# Patient Record
Sex: Female | Born: 1985
Health system: Southern US, Community
[De-identification: ages and names within clinical notes are randomized; demographics above are authoritative.]

## PROBLEM LIST (undated history)

## (undated) ENCOUNTER — Inpatient Hospital Stay (HOSPITAL_COMMUNITY): Payer: Self-pay

## (undated) DIAGNOSIS — G43909 Migraine, unspecified, not intractable, without status migrainosus: Secondary | ICD-10-CM

## (undated) DIAGNOSIS — L309 Dermatitis, unspecified: Secondary | ICD-10-CM

## (undated) DIAGNOSIS — F909 Attention-deficit hyperactivity disorder, unspecified type: Secondary | ICD-10-CM

## (undated) DIAGNOSIS — F32A Depression, unspecified: Secondary | ICD-10-CM

## (undated) DIAGNOSIS — F319 Bipolar disorder, unspecified: Secondary | ICD-10-CM

## (undated) DIAGNOSIS — F329 Major depressive disorder, single episode, unspecified: Secondary | ICD-10-CM

## (undated) HISTORY — DX: Depression, unspecified: F32.A

## (undated) HISTORY — PX: WISDOM TOOTH EXTRACTION: SHX21

## (undated) HISTORY — DX: Dermatitis, unspecified: L30.9

## (undated) HISTORY — DX: Migraine, unspecified, not intractable, without status migrainosus: G43.909

## (undated) HISTORY — DX: Bipolar disorder, unspecified: F31.9

## (undated) HISTORY — DX: Attention-deficit hyperactivity disorder, unspecified type: F90.9

## (undated) HISTORY — DX: Major depressive disorder, single episode, unspecified: F32.9

---
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
## 2007-01-07 ENCOUNTER — Inpatient Hospital Stay (HOSPITAL_COMMUNITY): Admission: AD | Admit: 2007-01-07 | Discharge: 2007-01-08 | Payer: Self-pay | Admitting: Obstetrics & Gynecology

## 2007-01-11 ENCOUNTER — Inpatient Hospital Stay (HOSPITAL_COMMUNITY): Admission: AD | Admit: 2007-01-11 | Discharge: 2007-01-11 | Payer: Self-pay | Admitting: Obstetrics

## 2007-01-15 ENCOUNTER — Inpatient Hospital Stay (HOSPITAL_COMMUNITY): Admission: AD | Admit: 2007-01-15 | Discharge: 2007-01-16 | Payer: Self-pay | Admitting: Obstetrics

## 2007-01-20 ENCOUNTER — Inpatient Hospital Stay (HOSPITAL_COMMUNITY): Admission: AD | Admit: 2007-01-20 | Discharge: 2007-01-22 | Payer: Self-pay | Admitting: Obstetrics & Gynecology

## 2007-11-27 ENCOUNTER — Emergency Department (HOSPITAL_COMMUNITY): Admission: EM | Admit: 2007-11-27 | Discharge: 2007-11-27 | Payer: Self-pay | Admitting: Emergency Medicine

## 2007-11-29 ENCOUNTER — Emergency Department (HOSPITAL_COMMUNITY): Admission: EM | Admit: 2007-11-29 | Discharge: 2007-11-29 | Payer: Self-pay | Admitting: Emergency Medicine

## 2007-12-01 ENCOUNTER — Ambulatory Visit: Payer: Self-pay | Admitting: Family Medicine

## 2008-09-18 ENCOUNTER — Emergency Department (HOSPITAL_COMMUNITY): Admission: EM | Admit: 2008-09-18 | Discharge: 2008-09-18 | Payer: Self-pay | Admitting: Emergency Medicine

## 2008-09-25 ENCOUNTER — Emergency Department (HOSPITAL_COMMUNITY): Admission: EM | Admit: 2008-09-25 | Discharge: 2008-09-25 | Payer: Self-pay | Admitting: Emergency Medicine

## 2008-10-10 ENCOUNTER — Emergency Department (HOSPITAL_COMMUNITY): Admission: EM | Admit: 2008-10-10 | Discharge: 2008-10-10 | Payer: Self-pay | Admitting: Emergency Medicine

## 2009-05-11 ENCOUNTER — Emergency Department (HOSPITAL_COMMUNITY): Admission: EM | Admit: 2009-05-11 | Discharge: 2009-05-11 | Payer: Self-pay | Admitting: Emergency Medicine

## 2009-08-27 ENCOUNTER — Emergency Department (HOSPITAL_COMMUNITY): Admission: EM | Admit: 2009-08-27 | Discharge: 2009-08-27 | Payer: Self-pay | Admitting: Emergency Medicine

## 2010-10-13 ENCOUNTER — Emergency Department (HOSPITAL_COMMUNITY)
Admission: EM | Admit: 2010-10-13 | Discharge: 2010-10-14 | Payer: Self-pay | Source: Home / Self Care | Admitting: Emergency Medicine

## 2010-11-03 DIAGNOSIS — L309 Dermatitis, unspecified: Secondary | ICD-10-CM

## 2010-11-03 HISTORY — DX: Dermatitis, unspecified: L30.9

## 2011-01-14 LAB — URINALYSIS, ROUTINE W REFLEX MICROSCOPIC
Ketones, ur: 15 mg/dL — AB
Nitrite: NEGATIVE
Protein, ur: 30 mg/dL — AB
Urobilinogen, UA: 0.2 mg/dL (ref 0.0–1.0)
pH: 5.5 (ref 5.0–8.0)

## 2011-01-14 LAB — POCT PREGNANCY, URINE

## 2011-01-14 LAB — COMPREHENSIVE METABOLIC PANEL
Calcium: 9.3 mg/dL (ref 8.4–10.5)
GFR calc non Af Amer: >60 mL/min (ref >60)
Potassium: 3.8 mEq/L (ref 3.5–5.1)
Sodium: 140 mEq/L (ref 135–145)
Total Bilirubin: 1 mg/dL (ref 0.3–1.2)
Total Protein: 6.7 g/dL (ref 6.0–8.3)

## 2011-01-14 LAB — CBC
Hemoglobin: 12.8 g/dL (ref 12.0–15.0)
MCHC: 34.5 g/dL (ref 30.0–36.0)
Platelets: 198 10*3/uL (ref 150–400)
RDW: 13.5 % (ref 11.5–15.5)

## 2011-01-14 LAB — DIFFERENTIAL
Basophils Absolute: 0 10*3/uL (ref 0.0–0.1)
Basophils Relative: 0 % (ref 0–1)
Monocytes Absolute: 0.8 10*3/uL (ref 0.1–1.0)
Neutro Abs: 14.2 10*3/uL — ABNORMAL HIGH (ref 1.7–7.7)
Neutrophils Relative %: 85 % — ABNORMAL HIGH (ref 43–77)

## 2011-01-14 LAB — WET PREP, GENITAL
Trich, Wet Prep: NONE SEEN
Yeast Wet Prep HPF POC: NONE SEEN

## 2011-01-14 LAB — LIPASE, BLOOD: Lipase: 20 U/L (ref 11–59)

## 2011-01-14 LAB — GC/CHLAMYDIA PROBE AMP, GENITAL

## 2011-02-09 LAB — DIFFERENTIAL
Basophils Absolute: 0 10*3/uL (ref 0.0–0.1)
Basophils Relative: 0 % (ref 0–1)
Neutro Abs: 8.8 10*3/uL — ABNORMAL HIGH (ref 1.7–7.7)
Neutrophils Relative %: 79 % — ABNORMAL HIGH (ref 43–77)

## 2011-02-09 LAB — CBC
HCT: 41.2 % (ref 36.0–46.0)
Hemoglobin: 14.3 g/dL (ref 12.0–15.0)
MCV: 90.8 fL (ref 78.0–100.0)
RDW: 13.6 % (ref 11.5–15.5)

## 2011-02-09 LAB — URINALYSIS, ROUTINE W REFLEX MICROSCOPIC
Ketones, ur: 15 mg/dL — AB
Nitrite: POSITIVE — AB
Protein, ur: 100 mg/dL — AB

## 2011-02-09 LAB — COMPREHENSIVE METABOLIC PANEL
Albumin: 4 g/dL (ref 3.5–5.2)
BUN: 6 mg/dL (ref 6–23)
CO2: 25 mEq/L (ref 19–32)
Chloride: 104 mEq/L (ref 96–112)
Creatinine, Ser: 0.79 mg/dL (ref 0.4–1.2)
GFR calc non Af Amer: >60 mL/min (ref >60)
Total Bilirubin: 0.7 mg/dL (ref 0.3–1.2)

## 2011-02-09 LAB — POCT PREGNANCY, URINE

## 2011-03-21 NOTE — Discharge Summary (Signed)
Victoria Barnes, Victoria Barnes               ACCOUNT NO.:  [id number]   MEDICAL RECORD NO.:  [id number]          PATIENT TYPE:  INP   LOCATION:  9159                          FACILITY:  [facility]   PHYSICIAN:  Charles A. Clearance Coots, M.D.DATE OF BIRTH:  02/26/1986   DATE OF ADMISSION:  01/07/2007  DATE OF DISCHARGE:  01/08/2007                               DISCHARGE SUMMARY   ADMISSION DIAGNOSES:  1. [redacted] weeks gestation.  2. Vaginal bleeding after intercourse.   DISCHARGE DIAGNOSIS:  1. [redacted] weeks gestation.  2. Vaginal bleeding after intercourse.  3. Much improved after bedrest and observation. No further vaginal      bleeding after admission to the hospital.   Discharged home in good condition and [redacted] weeks gestation undelivered.   REASON FOR ADMISSION:  An 25 year old G1 with an estimated date of  confinement of February 12, 2007 presents complaining of vaginal bleeding  after intercourse. The patient had minimal cramping and denied any  vaginal discharge.   PAST MEDICAL HISTORY:  Surgery; oral surgery and foot surgery.  Illnesses; migraine headaches.   MEDICATIONS:  Prenatal vitamins.   ALLERGIES:  PENICILLIN and KEFLEX.   SOCIAL HISTORY:  Consulting civil engineer at [employer], single.  Negative for alcohol, tobacco  or recreational drug use.   FAMILY HISTORY:  Prostate cancer, hypertension, diabetes, asthma,  Alzheimer's disease and peptic ulcer disease.   PHYSICAL EXAM:  GENERAL:  Slim black female in no acute distress.  VITAL SIGNS:  Afebrile, vital signs were stable. Fetal heart rate in  140's. Occasional uterine contractions on toco.  LUNGS:  Clear to auscultation bilaterally.  HEART: Regular rate and rhythm.  ABDOMEN:  Gravid, nontender.  PELVIC:  Sterile vaginal examination; there was a moderate amount of  blood noted.  Cervix was long and closed.   IMPRESSION:  Third trimester bleeding related to intercourse, stable, [redacted]  weeks gestation.   PLAN:  Admit for observation.   HOSPITAL COURSE:  The  patient was admitted on bedrest and had no further  bleeding during her period of observation for 23 hours. She was  therefore discharged home on hospital day in stable condition with no  further bleeding at [redacted] weeks gestation to be followed up in the office.   LABORATORY VALUES:  Ultrasound revealed placenta to be posterior above  the cervical os, no evidence of previa, no retroplacental clot noted.  Growth and measurements were all within normal limits, presentation was  cephalic.   DISCHARGE DISPOSITION:  Medications; continue prenatal vitamins. No  sexual intercourse for the remainder of the pregnancy.  The patient is  to call office for a follow-up appointment in 2 weeks.      Charles A. Clearance Coots, M.D.  Electronically Signed     CAH/MEDQ  D:  01/08/2007  T:  01/08/2007  Job:  [id number]

## 2011-03-21 NOTE — H&P (Signed)
Victoria Barnes, Victoria Barnes NO.:  [id number]   MEDICAL RECORD NO.:  [id number]          PATIENT TYPE:  INP   LOCATION:  9159                          [facility]ACILITY:  [facility]   PHYSICIAN:  Roseanna Rainbow, M.D.DATE OF BIRTH:  [birth year]   DATE OF ADMISSION:  01/07/2007  DATE OF DISCHARGE:                              HISTORY & PHYSICAL   CHIEF COMPLAINT:  The patient is an 25 year old gravida 1 para 0 with an  estimated date of confinement of February 12, 2007 with an intrauterine  pregnancy at 34 weeks complaining of vaginal bleeding.   HISTORY OF PRESENT ILLNESS:  Please see the above.  The patient reports  recent coitus.  Subsequent to this she has noticed fairly heavy vaginal  bleeding that was not self-limited.  She has minimal cramping.  She  denies any prior vaginal discharge.   ALLERGIES:  PENICILLIN AND CEPHALOSPORINS.   MEDICATIONS:  Please see the medication reconciliation sheet.   OBSTETRICAL RISK FACTORS:  She is Rh negative, nonsensitized.  She has a  recent positive Chlamydia DNA probe.   PRENATAL LABORATORIES:  Chlamydia DNA probe positive.  Urine culture and  sensitivity no growth.  One-hour GCT 125.  GC probe negative.  Hepatitis  B surface antigen negative.  Hematocrit 33.5, hemoglobin 11.1.  Pap  smear low-grade SIL.  Platelets 200,000.  Blood type A negative,  antibody screen negative.  Rubella immune.  Sickle cell negative.   PAST GYNECOLOGIC HISTORY:  Regular menses.  Abnormal Pap smears.  Gonorrhea.  Urinary tract infections.   PAST MEDICAL HISTORY:  Migraine headaches.   PAST SURGICAL HISTORY:  Oral surgery, foot surgery.   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  She denies  any history of alcohol usage.  She has no significant smoking history.  She denies illicit drug use.   FAMILY HISTORY:  Prostate cancer, hypertension, adult-onset diabetes,  asthma, Alzheimer's disease, and peptic ulcer disease.   PHYSICAL  EXAMINATION:  VITAL SIGNS:  Stable, afebrile.  Fetal heart  baseline 140s, mild variable decelerations.  Tocodynamometer rare  uterine contractions.  ABDOMEN:  Nontender.  STERILE VAGINAL EXAM:  Per the RN.  There is moderate blood noted.  The  cervix is long and closed.   ASSESSMENT:  Third trimester bleeding likely related to recent  intercourse, rule out cervicitis.   PLAN:  Observe in the hospital on bed rest.  We will check a CBC.  Continuous fetal monitoring for now.      Roseanna Rainbow, M.D.  Electronically Signed     LAJ/MEDQ  D:  01/07/2007  T:  01/07/2007  Job:  [id number]

## 2011-07-25 LAB — CBC
HCT: 40
MCV: 88.6
Platelets: 193
RDW: 13.4

## 2011-07-25 LAB — URINALYSIS, ROUTINE W REFLEX MICROSCOPIC
Bilirubin Urine: NEGATIVE
Hgb urine dipstick: NEGATIVE
Nitrite: NEGATIVE
Specific Gravity, Urine: 1.019
pH: 6

## 2011-07-25 LAB — I-STAT 8, (EC8 V) (CONVERTED LAB)
Acid-base deficit: 2
Chloride: 108
HCT: 45
Operator id: 196461
TCO2: 25
pCO2, Ven: 44.1 — ABNORMAL LOW

## 2011-07-25 LAB — POCT I-STAT CREATININE
Creatinine, Ser: 0.9
Operator id: 196461

## 2011-07-25 LAB — POCT PREGNANCY, URINE
Operator id: 151321
Preg Test, Ur: NEGATIVE

## 2011-08-08 LAB — URINALYSIS, ROUTINE W REFLEX MICROSCOPIC
Bilirubin Urine: NEGATIVE
Glucose, UA: NEGATIVE mg/dL
Specific Gravity, Urine: 1.01 (ref 1.005–1.030)
pH: 7 (ref 5.0–8.0)

## 2011-08-08 LAB — URINE MICROSCOPIC-ADD ON

## 2011-11-06 IMAGING — CT CT ABD-PELV W/ CM
2 of 4 series · 17 of 46 positions shown, 19 images · IV contrast (APPLIED)
Comparison: 10/10/2008

CLINICAL DATA: Abdominal pain

CT ABDOMEN AND PELVIS WITH CONTRAST
TECHNIQUE: Multidetector CT imaging of the abdomen and pelvis was
performed following the standard protocol during bolus
administration of intravenous contrast.

[Series 2: abd/pelv with 5.0 b31f st · axial · 0.61mm/px · z∈[+823,+1168]mm · 14 of 76 slices shown, 16 images]
[im 4/76  soft-tissue]
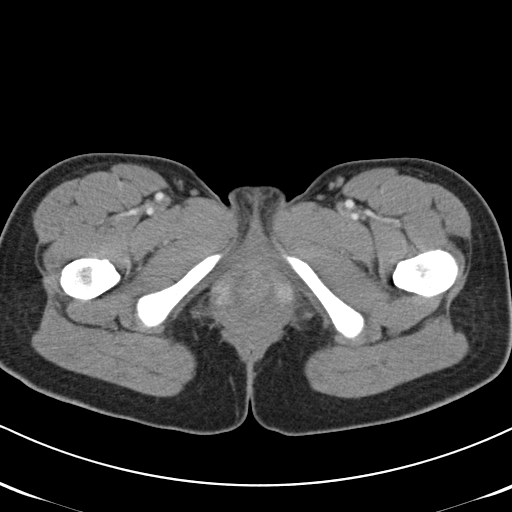
[im 4/76  bone]
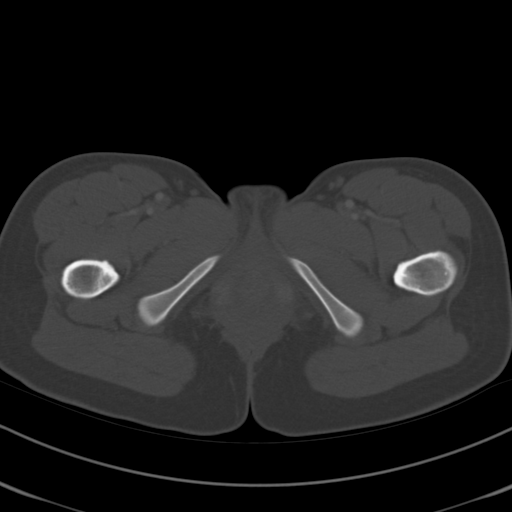
[im 10/76  soft-tissue]
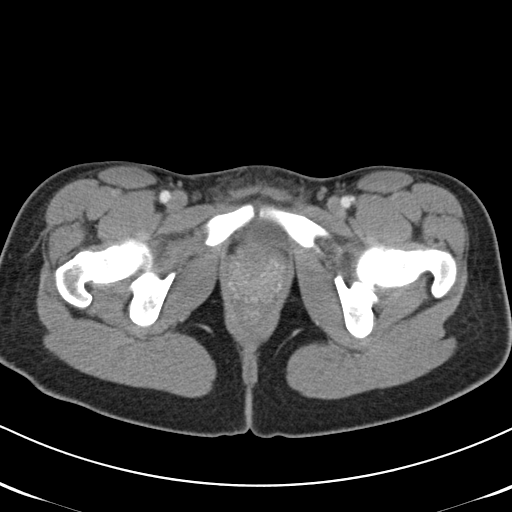
[im 16/76  soft-tissue]
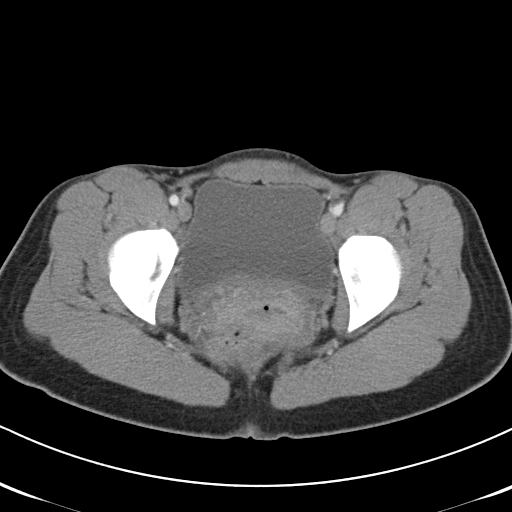
[im 22/76  soft-tissue]
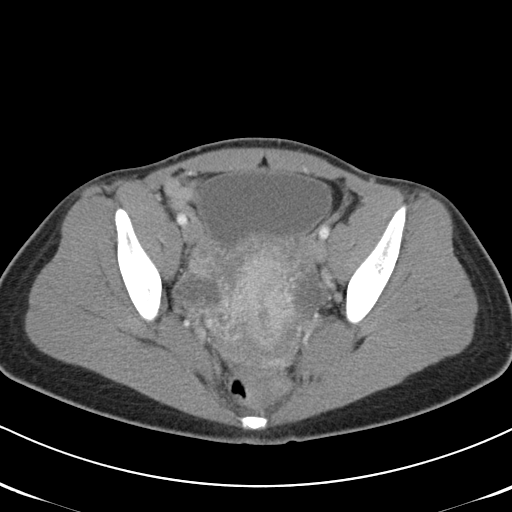
[im 25/76  soft-tissue]
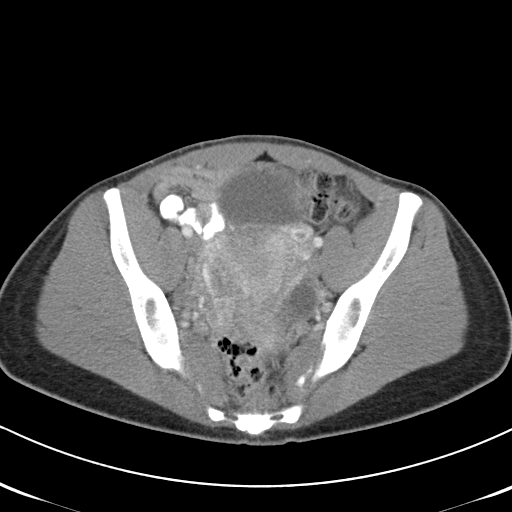
[im 31/76  soft-tissue]
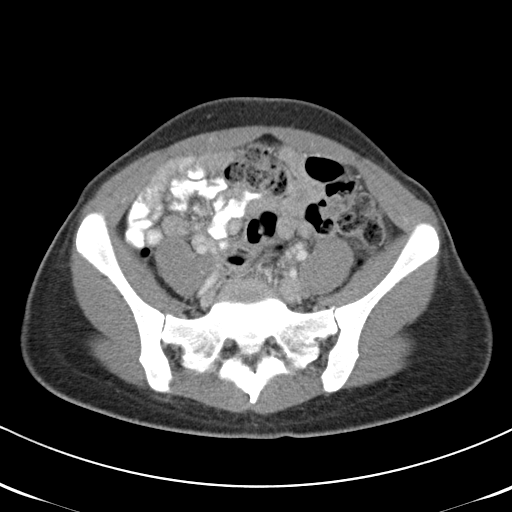
[im 37/76  soft-tissue]
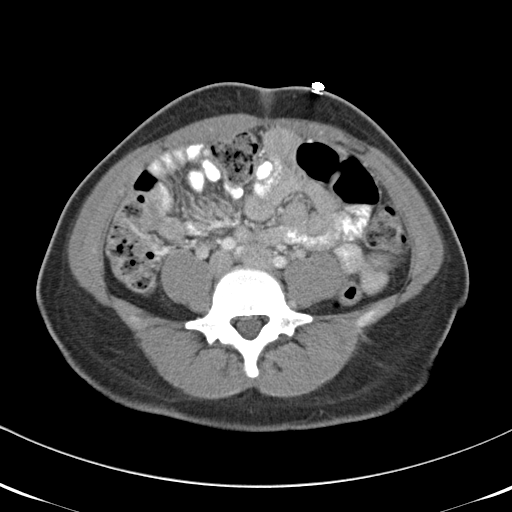
[im 40/76  soft-tissue]
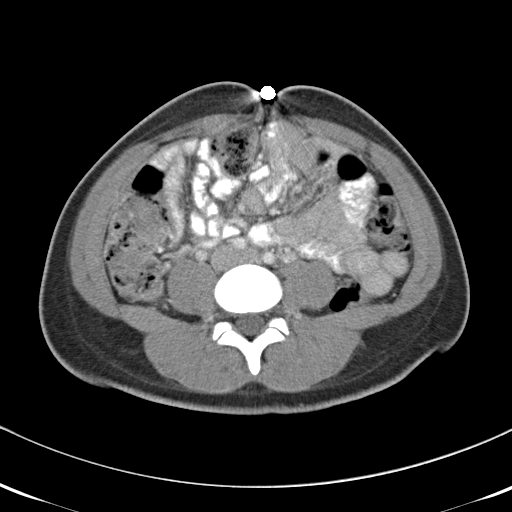
[im 46/76  soft-tissue]
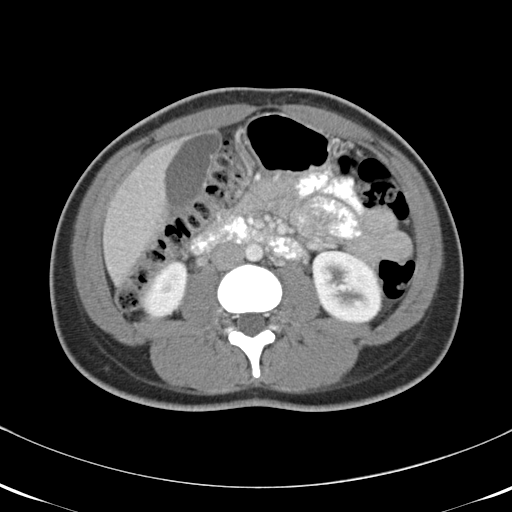
[im 46/76  bone]
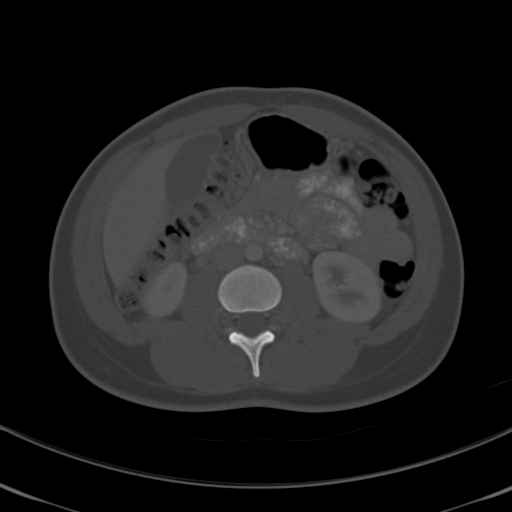
[im 52/76  soft-tissue]
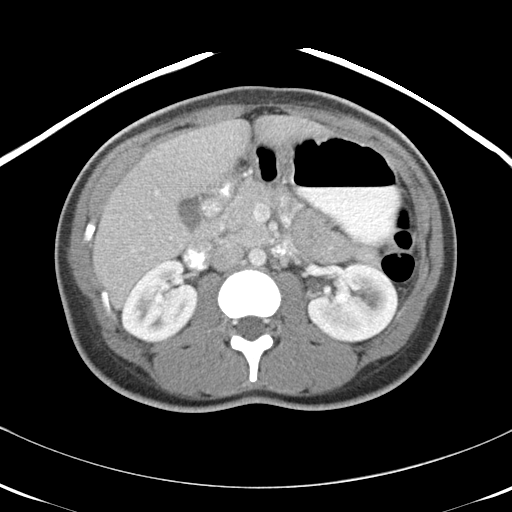
[im 58/76  soft-tissue]
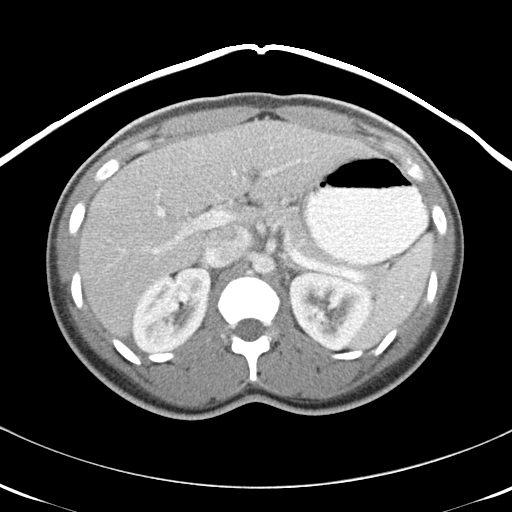
[im 61/76  soft-tissue]
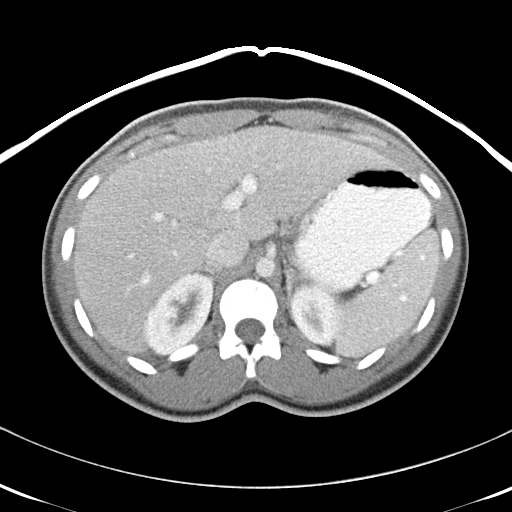
[im 67/76  soft-tissue]
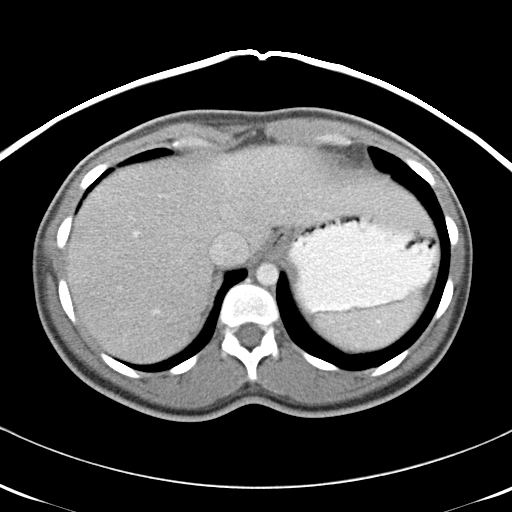
[im 73/76  soft-tissue]
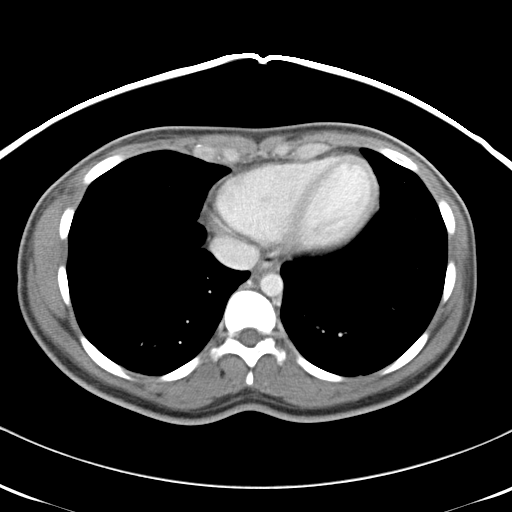

[Series 602: cior · coronal · 0.74mm/px · 3 of 66 slices shown]
[im 22/66  soft-tissue]
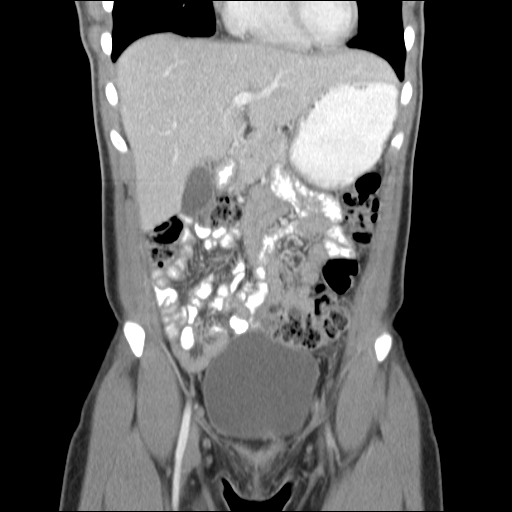
[im 29/66  soft-tissue]
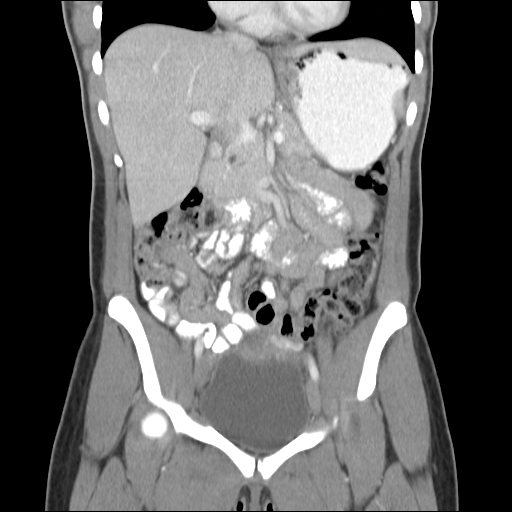
[im 37/66  soft-tissue]
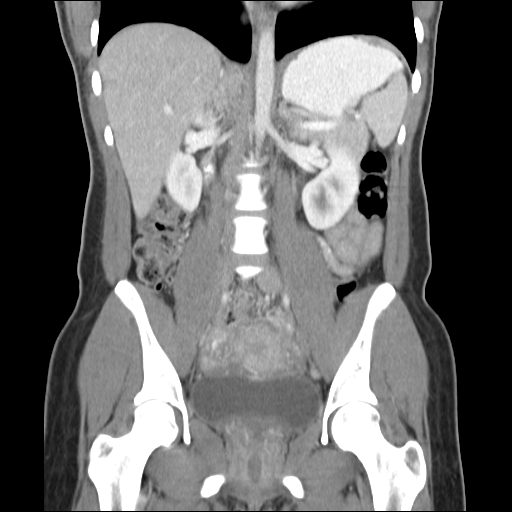

[17 of 46 positions shown; findings below may reference images not displayed]

Contrast: 100 ml of omni 300

NOTE:  After receiving intravenous contrast the patient developed
hives.  I personally evaluate the patient.  The patient reported no
symptoms of shortness of breath, chest pain or difficulty
swallowing.  The plan is for the patient to very turned to the
emergency department and received 25 mg of Benadryl.
FINDINGS: Visualized lung bases are clear.

There is a focal area of low density adjacent to the falciform
ligament which likely represents a focal fatty deposition.  The
liver is otherwise normal.

Pancreas is normal.

The adrenal glands are normal.

 The kidneys are normal.

Gallbladder is unremarkable by CT.

No biliary ductal dilation.

Stomach and visualized large and small bowel are unremarkable.

Abdominal aorta normal is in caliber.

No significant lymphadenopathy.

No free fluid or abnormal fluid collections.

Appendix identified and normal.

Visualized colon and small bowel are unremarkable.

No free fluid or abnormal fluid collections.

No significant lymphadenopathy.

Urinary bladder is normal.
IMPRESSION: 1.  No acute findings identified within the abdomen or pelvis.
2.  Normal appendix.

## 2011-11-07 IMAGING — US US PELVIS COMPLETE
1 series · 13 of 25 positions shown · non-contrast
Comparison: None.

CLINICAL DATA: Pelvic pain

TRANSABDOMINAL AND TRANSVAGINAL ULTRASOUND OF PELVIS
DOPPLER ULTRASOUND OF OVARIES
TECHNIQUE: Color and duplex Doppler ultrasound was utilized to
evaluate blood flow to the ovaries.
TECHNIQUE: Both transabdominal and transvaginal ultrasound
examinations of the pelvis were performed including evaluation of
the uterus, ovaries, adnexal regions, and pelvic cul-de-sac.
Transabdominal technique was performed for global imaging of the
pelvis.  Transvaginal technique was performed for detailed
evaluation of the endometrium and/or ovaries.

[Series 1: us pelvis complete · 0.26mm/px · 13 of 67 slices shown]
[im 1/67]
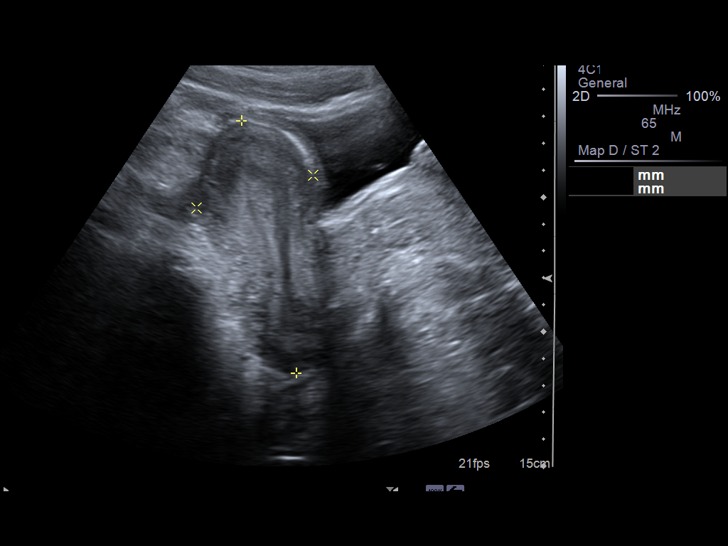
[im 6/67]
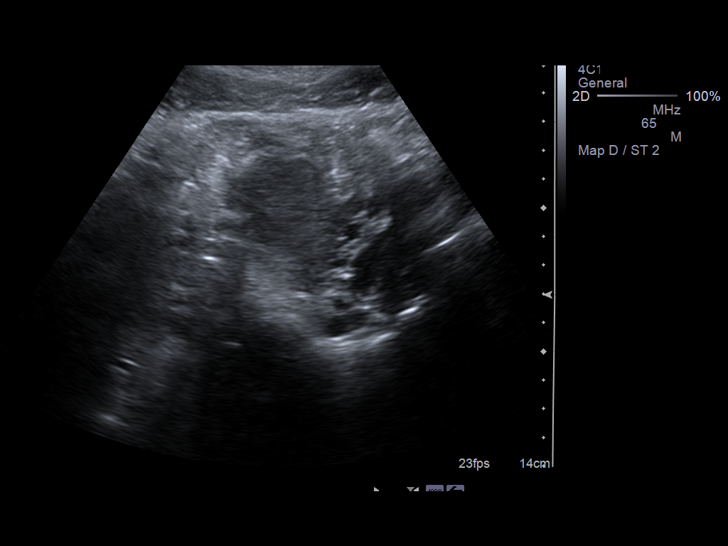
[im 12/67]
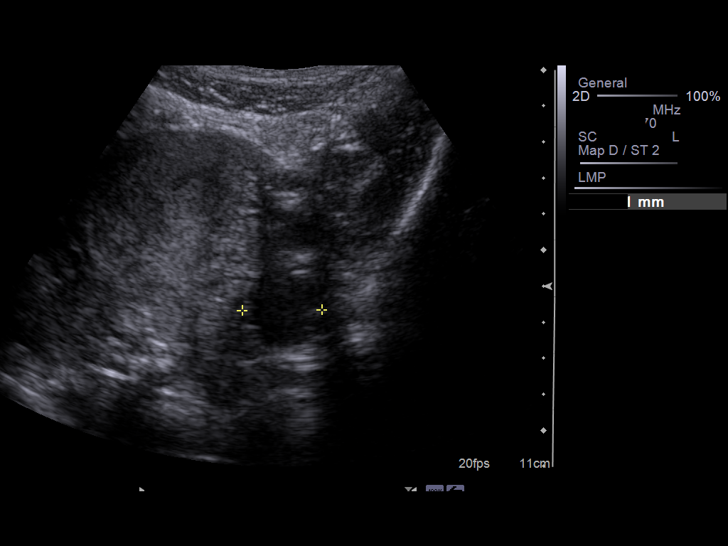
[im 17/67]
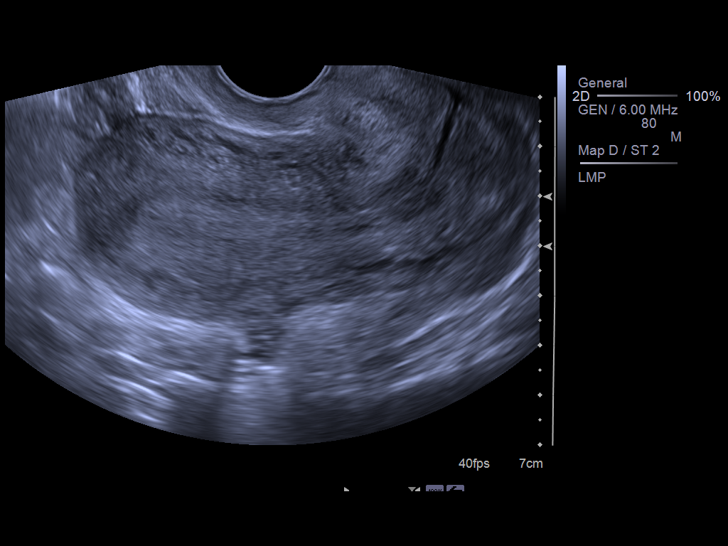
[im 23/67]
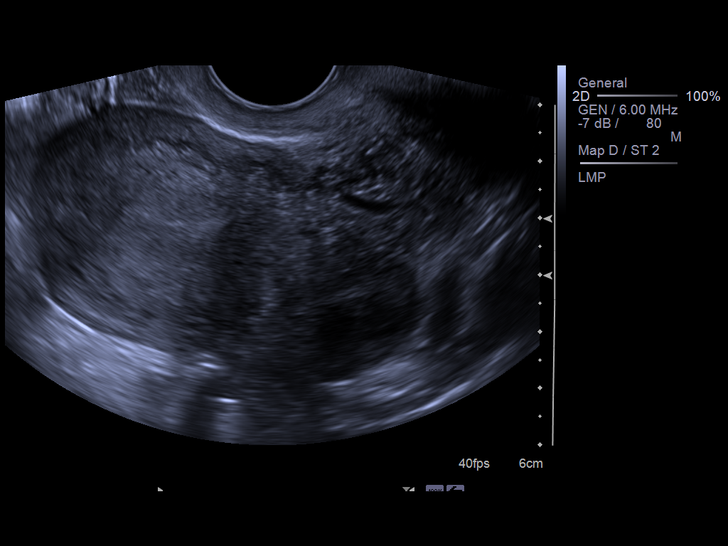
[im 28/67]
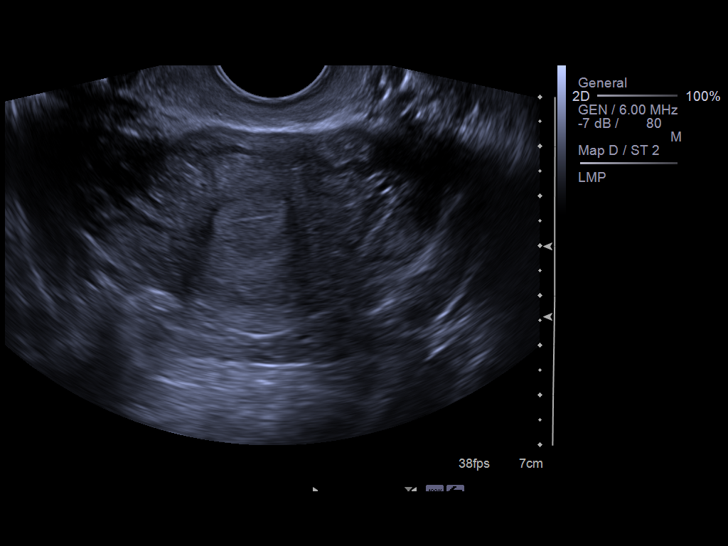
[im 34/67]
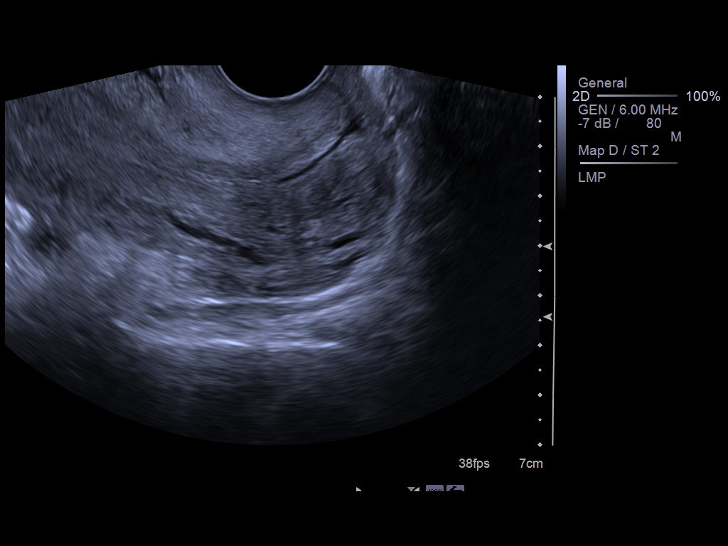
[im 39/67]
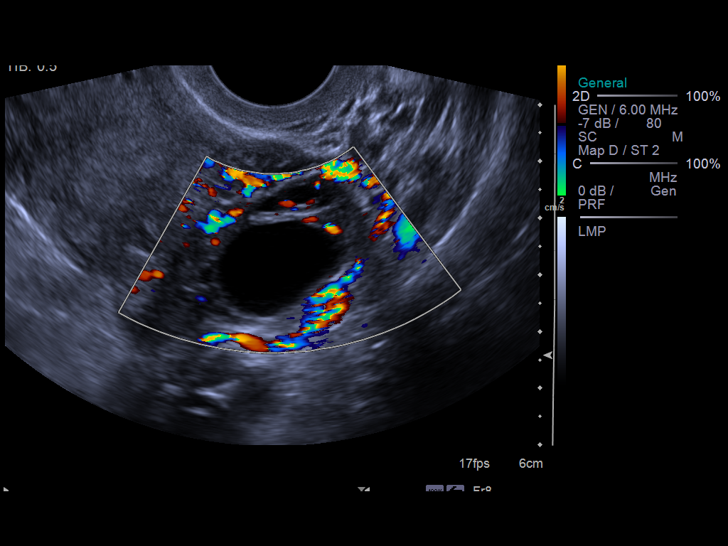
[im 45/67]
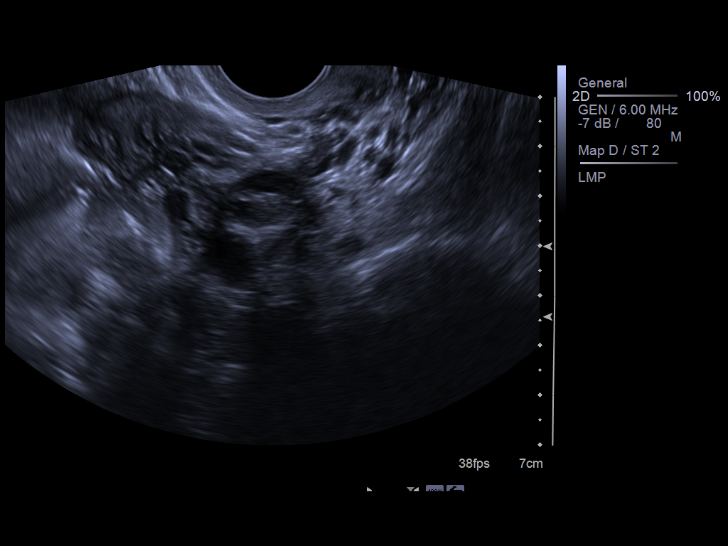
[im 50/67]
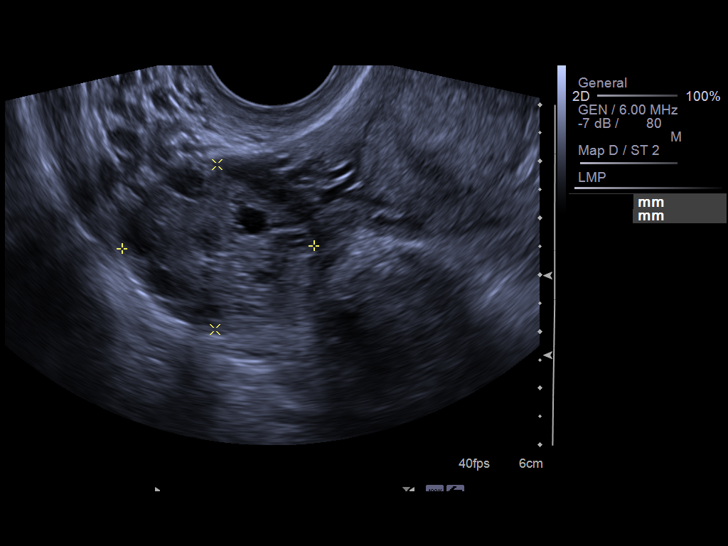
[im 56/67]
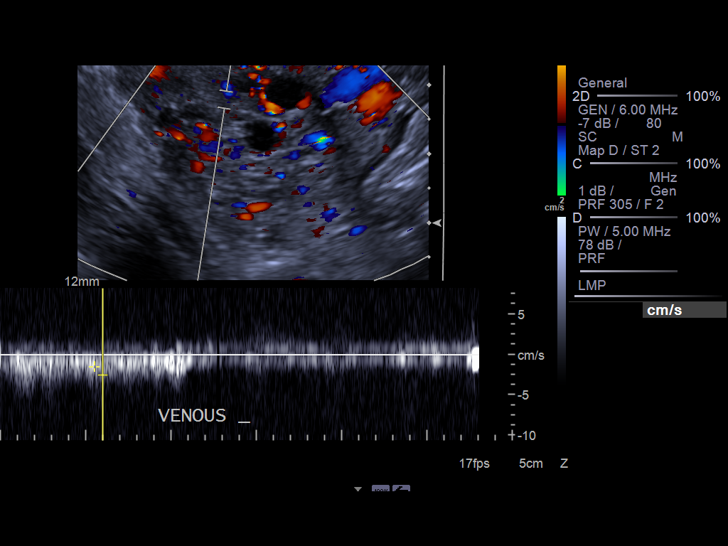
[im 61/67]
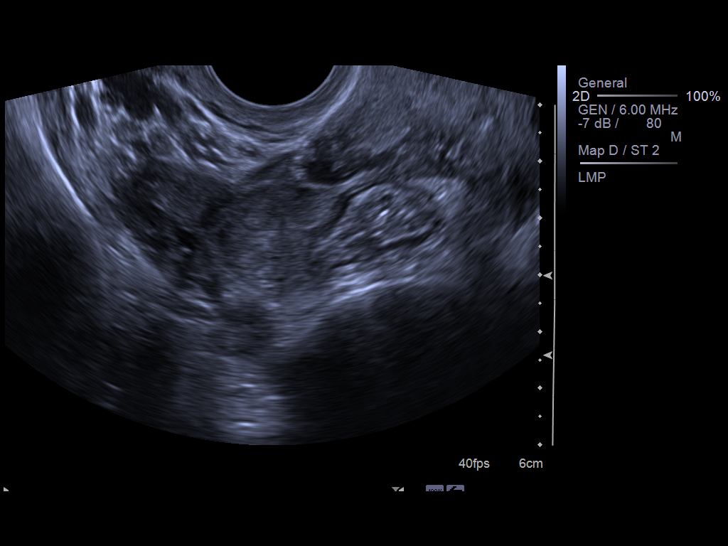
[im 67/67]
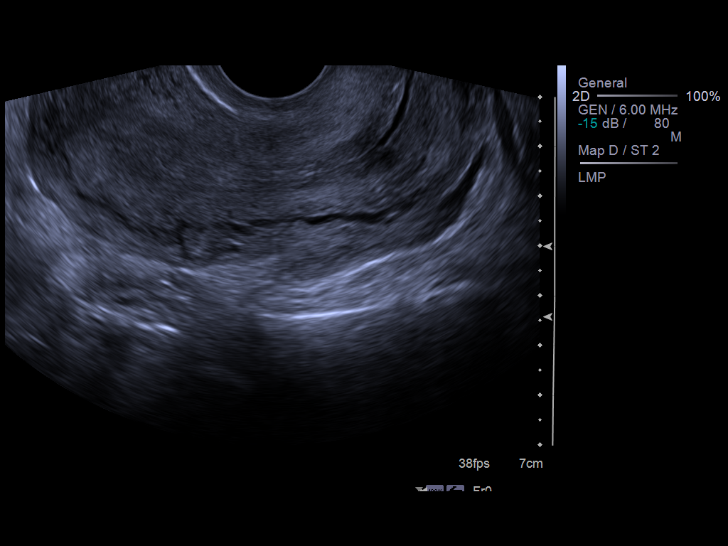

[13 of 25 positions shown; findings below may reference images not displayed]

FINDINGS: Uterus measures 9.6 x 4.5 x 5.0 cm. No fibroids or other uterine
masses identified.

Endometrium measures 7 mm. in thickness.  Within normal limits in
appearance.

Right Ovary measures 3.4 x 2.9 x 2.3 cm. Normal appearance.

Left Ovary measures 3.5 x 2.2 x 2.7 cm.  Dominant follicle is
identified within the right ovary measuring 1.4 x 2.1 by 2.0 cm.

Blood flow is seen within both ovaries on color Doppler sonography.
Doppler spectral waveforms show both arterial and venous flow
signal in both ovaries.

Other Findings:  No other abnormality identified.
IMPRESSION: Normal study.  No evidence of pelvic mass or other significant
abnormality.

## 2012-11-29 ENCOUNTER — Inpatient Hospital Stay (HOSPITAL_COMMUNITY)
Admission: AD | Admit: 2012-11-29 | Discharge: 2012-11-29 | Disposition: A | Discharge status: Home / Self Care | Payer: Self-pay | Source: Ambulatory Visit | Attending: Obstetrics & Gynecology | Admitting: Obstetrics & Gynecology

## 2012-11-29 ENCOUNTER — Inpatient Hospital Stay (HOSPITAL_COMMUNITY): Payer: Self-pay

## 2012-11-29 ENCOUNTER — Encounter (HOSPITAL_COMMUNITY): Payer: Self-pay

## 2012-11-29 DIAGNOSIS — E860 Dehydration: Secondary | ICD-10-CM | POA: Insufficient documentation

## 2012-11-29 DIAGNOSIS — O26899 Other specified pregnancy related conditions, unspecified trimester: Secondary | ICD-10-CM

## 2012-11-29 DIAGNOSIS — R109 Unspecified abdominal pain: Secondary | ICD-10-CM | POA: Insufficient documentation

## 2012-11-29 DIAGNOSIS — O219 Vomiting of pregnancy, unspecified: Secondary | ICD-10-CM

## 2012-11-29 DIAGNOSIS — O211 Hyperemesis gravidarum with metabolic disturbance: Secondary | ICD-10-CM | POA: Insufficient documentation

## 2012-11-29 DIAGNOSIS — O99891 Other specified diseases and conditions complicating pregnancy: Secondary | ICD-10-CM | POA: Insufficient documentation

## 2012-11-29 DIAGNOSIS — Z3490 Encounter for supervision of normal pregnancy, unspecified, unspecified trimester: Secondary | ICD-10-CM

## 2012-11-29 LAB — WET PREP, GENITAL
Trich, Wet Prep: NONE SEEN
Yeast Wet Prep HPF POC: NONE SEEN

## 2012-11-29 LAB — CBC
HCT: 35.5 % — ABNORMAL LOW (ref 36.0–46.0)
Hemoglobin: 12.4 g/dL (ref 12.0–15.0)
MCH: 30.7 pg (ref 26.0–34.0)
MCHC: 34.9 g/dL (ref 30.0–36.0)

## 2012-11-29 LAB — URINALYSIS, ROUTINE W REFLEX MICROSCOPIC
Glucose, UA: NEGATIVE mg/dL
Hgb urine dipstick: NEGATIVE
Ketones, ur: 40 mg/dL — AB
Protein, ur: NEGATIVE mg/dL

## 2012-11-29 MED ORDER — PROMETHAZINE HCL 12.5 MG PO TABS: 12.5000 mg | ORAL_TABLET | Freq: Four times a day (QID) | ORAL | Status: DC | PRN

## 2012-11-29 MED ORDER — LACTATED RINGERS IV SOLN
Freq: Once | INTRAVENOUS | Status: AC
  Administered 2012-11-29: 19:00:00 via INTRAVENOUS

## 2012-11-29 MED ORDER — PRENATAL PLUS 27-1 MG PO TABS: 1.0000 | ORAL_TABLET | Freq: Every day | ORAL | Status: DC

## 2012-11-29 MED ORDER — ONDANSETRON 4 MG PO TBDP
4.0000 mg | ORAL_TABLET | Freq: Once | ORAL | Status: AC
  Administered 2012-11-29: 4 mg via ORAL
  Filled 2012-11-29: qty 1

## 2012-11-29 NOTE — MAU Note (Signed)
Patient states she has had a positive home pregnancy test last night. Patient states she has been having headaches for about 1 1/2 weeks. Has had vomiting of most food for 5 days. Has some mild abdominal cramping but no bleeding. Has a heavy discharge. Feel tired and light headed at times.

## 2012-11-29 NOTE — MAU Provider Note (Signed)
Attestation of Attending Supervision of Advanced Practitioner (CNM/NP): Evaluation and management procedures were performed by the Advanced Practitioner under my supervision and collaboration. I have reviewed the Advanced Practitioner's note and chart, and I agree with the management and plan.  Blair Lundeen H. 11:00 PM

## 2012-11-29 NOTE — MAU Provider Note (Signed)
Chief Complaint: Possible Pregnancy, Headache, Emesis and Abdominal Pain     SUBJECTIVE HPI: Victoria Barnes is a 27 y.o. G3P0101 at [redacted]w[redacted]d by uncertain MP who presents with 4 d hx of lower abdominal cramping pain and inability to retain solid food, usually vomiting 1 hr after attempting to eat; today feeling weak and fatigued. Lightheaded and feeling faint today. Denies vaginal bleeding or irritative discharge.   History reviewed. No pertinent past medical history. OB History    Grav Para Term Preterm Abortions TAB SAB Ect Mult Living   3 1  1      1      # Outc Date GA Lbr Len/2nd Wgt Sex Del Anes PTL Lv   1 CUR            2 GRA            3 PRE              Past Surgical History  Procedure Date  . Wisdom tooth extraction    History   Social History  . Marital Status: Single    Spouse Name: N/A    Number of Children: N/A  . Years of Education: N/A   Occupational History  . Not on file.   Social History Main Topics  . Smoking status: Current Some Day Smoker  . Smokeless tobacco: Not on file  . Alcohol Use: No  . Drug Use: No  . Sexually Active: Yes   Other Topics Concern  . Not on file   Social History Narrative  . No narrative on file   No current facility-administered medications on file prior to encounter.   No current outpatient prescriptions on file prior to encounter.   Allergies  Allergen Reactions  . Keflex (Cephalexin) Nausea And Vomiting  . Penicillins Nausea And Vomiting    ROS: Pertinent items in HPI  OBJECTIVE Blood pressure 121/69, pulse 81, temperature 98.2 F (36.8 C), temperature source Oral, resp. rate 16, height 5' (1.524 m), weight 102 lb 12.8 oz (46.63 kg), last menstrual period 09/17/2012, SpO2 100.00%. GENERAL: Well-developed, well-nourished female in no acute distress.  HEENT: Normocephalic HEART: normal rate RESP: normal effort ABDOMEN: Soft, non-tender EXTREMITIES: Nontender, no edema NEURO: Alert and oriented SPECULUM EXAM:  NEFG, creamy white discharge, no blood noted, cervix clean BIMANUAL: cervix L/C; uterus 6-8 wks size, no adnexal tenderness or masses  LAB RESULTS Results for orders placed during the hospital encounter of 11/29/12 (from the past 24 hour(s))  URINALYSIS, ROUTINE W REFLEX MICROSCOPIC     Status: Abnormal   Collection Time   11/29/12  6:00 PM      Component Value Range   Color, Urine YELLOW  YELLOW   APPearance CLEAR  CLEAR   Specific Gravity, Urine 1.025  1.005 - 1.030   pH 6.0  5.0 - 8.0   Glucose, UA NEGATIVE  NEGATIVE mg/dL   Hgb urine dipstick NEGATIVE  NEGATIVE   Bilirubin Urine NEGATIVE  NEGATIVE   Ketones, ur 40 (*) NEGATIVE mg/dL   Protein, ur NEGATIVE  NEGATIVE mg/dL   Urobilinogen, UA 0.2  0.0 - 1.0 mg/dL   Nitrite NEGATIVE  NEGATIVE   Leukocytes, UA NEGATIVE  NEGATIVE  POCT PREGNANCY, URINE     Status: Abnormal   Collection Time   11/29/12  6:07 PM      Component Value Range   Preg Test, Ur POSITIVE (*) NEGATIVE  WET PREP, GENITAL     Status: Abnormal   Collection Time  11/29/12  6:50 PM      Component Value Range   Yeast Wet Prep HPF POC NONE SEEN  NONE SEEN   Trich, Wet Prep NONE SEEN  NONE SEEN   Clue Cells Wet Prep HPF POC FEW (*) NONE SEEN   WBC, Wet Prep HPF POC MODERATE (*) NONE SEEN  CBC     Status: Abnormal   Collection Time   11/29/12  6:57 PM      Component Value Range   WBC 6.5  4.0 - 10.5 K/uL   RBC 4.04  3.87 - 5.11 MIL/uL   Hemoglobin 12.4  12.0 - 15.0 g/dL   HCT 16.1 (*) 09.6 - 04.5 %   MCV 87.9  78.0 - 100.0 fL   MCH 30.7  26.0 - 34.0 pg   MCHC 34.9  30.0 - 36.0 g/dL   RDW 40.9  81.1 - 91.4 %   Platelets 198  150 - 400 K/uL  ABO/RH     Status: Normal   Collection Time   11/29/12  6:57 PM      Component Value Range   ABO/RH(D) A NEG    HCG, QUANTITATIVE, PREGNANCY     Status: Abnormal   Collection Time   11/29/12  6:57 PM      Component Value Range   hCG, Beta Chain, Quant, S 54371 (*) <5 mIU/mL    IMAGING  OBSTETRIC <14 WK ULTRASOUND    Technique: Transabdominal ultrasound was performed for evaluation  of the gestation as well as the maternal uterus and adnexal  regions.  Comparison: None.  Intrauterine gestational sac: Visualized/normal in shape.  Yolk sac: Present  Embryo: Present  Cardiac Activity: Present  Heart Rate: 106 bpm  CRL: 2.1 mm 5 w 6 d [place] EDC: 07/26/2013  Maternal uterus/Adnexae:  No subchronic hemorrhage.  Right ovary is within normal limits, measuring 2.1 x 1.2 x 1.7 cm.  Left ovary is within normal limits, measuring 2.2 x 3.38 x 2.0 cm,  and is notable for a 1.4 cm corpus luteal cyst.  No free fluid.  IMPRESSION:  Single live intrauterine gestation with estimated gestational age [redacted]  weeks 6 days by crown-rump length.  Original Report Authenticated By: Charline Bills, M.D.   MAU COURSE Zofran 4 mg SL given IV LR 1000 given> symptoms improved, tolerating liquids  ASSESSMENT 1. Abdominal pain complicating pregnancy   2. Mild dehydration   3. Nausea/vomiting in pregnancy   4. Viable pregnancy     PLAN Discharge home See AVS, verification letter and eligibilty info given Follow-up Information    Follow up with [facility] [place]. (Choose from list of prenatal care providrs below)    Contact information:   [address] [place] [address] [phone]          Medication List     As of 11/29/2012  8:14 PM    STOP taking these medications         HYDROcodone-acetaminophen 5-325 MG per tablet   Commonly known as: NORCO/VICODIN      ibuprofen 800 MG tablet   Commonly known as: ADVIL,MOTRIN      ITCHY EYE DROPS OP      TAKE these medications         prenatal vitamin w/FE, FA 27-1 MG Tabs   Take 1 tablet by mouth daily.      promethazine 12.5 MG tablet   Commonly known as: PHENERGAN   Take 1 tablet (12.5 mg total) by mouth  every 6 (six) hours as needed for nausea.          Danae Orleans, CNM 11/29/2012  6:33 PM

## 2013-10-04 ENCOUNTER — Encounter (HOSPITAL_COMMUNITY): Payer: Self-pay | Admitting: *Deleted

## 2013-11-02 ENCOUNTER — Encounter (HOSPITAL_COMMUNITY): Payer: Self-pay | Admitting: Emergency Medicine

## 2013-11-02 ENCOUNTER — Emergency Department (HOSPITAL_COMMUNITY)
Admission: EM | Admit: 2013-11-02 | Discharge: 2013-11-02 | Disposition: A | Discharge status: Home / Self Care | Payer: Medicaid Other | Attending: Emergency Medicine | Admitting: Emergency Medicine

## 2013-11-02 ENCOUNTER — Emergency Department (HOSPITAL_COMMUNITY): Payer: Medicaid Other

## 2013-11-02 DIAGNOSIS — Y92009 Unspecified place in unspecified non-institutional (private) residence as the place of occurrence of the external cause: Secondary | ICD-10-CM | POA: Insufficient documentation

## 2013-11-02 DIAGNOSIS — Y939 Activity, unspecified: Secondary | ICD-10-CM | POA: Insufficient documentation

## 2013-11-02 DIAGNOSIS — Z880 Allergy status to penicillin: Secondary | ICD-10-CM | POA: Insufficient documentation

## 2013-11-02 DIAGNOSIS — F17200 Nicotine dependence, unspecified, uncomplicated: Secondary | ICD-10-CM | POA: Insufficient documentation

## 2013-11-02 DIAGNOSIS — S42002A Fracture of unspecified part of left clavicle, initial encounter for closed fracture: Secondary | ICD-10-CM

## 2013-11-02 DIAGNOSIS — W010XXA Fall on same level from slipping, tripping and stumbling without subsequent striking against object, initial encounter: Secondary | ICD-10-CM | POA: Insufficient documentation

## 2013-11-02 DIAGNOSIS — S42023A Displaced fracture of shaft of unspecified clavicle, initial encounter for closed fracture: Secondary | ICD-10-CM | POA: Insufficient documentation

## 2013-11-02 MED ORDER — OXYCODONE-ACETAMINOPHEN 5-325 MG PO TABS: 1.0000 | ORAL_TABLET | ORAL | Status: DC | PRN

## 2013-11-02 MED ORDER — OXYCODONE-ACETAMINOPHEN 5-325 MG PO TABS
1.0000 | ORAL_TABLET | Freq: Once | ORAL | Status: AC
  Administered 2013-11-02: 1 via ORAL
  Filled 2013-11-02: qty 1

## 2013-11-02 NOTE — ED Notes (Signed)
Pt. fell at home this morning and injured her left shoulder , no LOC / ambulatory , presents with pain at left shoulder .

## 2013-11-03 NOTE — ED Provider Notes (Signed)
CSN: [id number]     Arrival date & time 11/02/13  [id number] History   First MD Initiated Contact with Patient 11/02/13 [phone]     Chief Complaint  Patient presents with  . Shoulder Injury   (Consider location/radiation/quality/duration/timing/severity/associated sxs/prior Treatment) Patient is a 28 y.o. female presenting with shoulder injury. The history is provided by the patient.  Shoulder Injury  She tripped and fell injuring her left shoulder. She complaining of severe pain and rates pain at 10/10. She denies other injury.   History reviewed. No pertinent past medical history. Past Surgical History  Procedure Laterality Date  . Wisdom tooth extraction     No family history on file. History  Substance Use Topics  . Smoking status: Current Some Day Smoker  . Smokeless tobacco: Not on file  . Alcohol Use: No   OB History   Grav Para Term Preterm Abortions TAB SAB Ect Mult Living   3 1  1      1      Review of Systems  All other systems reviewed and are negative.    Allergies  Keflex and Penicillins  Home Medications   Current Outpatient Rx  Name  Route  Sig  Dispense  Refill  . oxyCODONE-acetaminophen (PERCOCET) 5-325 MG per tablet   Oral   Take 1 tablet by mouth every 4 (four) hours as needed.   20 tablet   0    BP 118/70  Pulse 88  Temp(Src) 98.3 F (36.8 C) (Oral)  Resp 16  Ht 4\' 11"  (1.499 m)  Wt 107 lb (48.535 kg)  BMI 21.60 kg/m2  SpO2 100%  LMP 10/29/2013 Physical Exam  Nursing note and vitals reviewed.  28 year old female, resting comfortably and in no acute distress. Vital signs are normal. Oxygen saturation is 100%, which is normal. Head is normocephalic and atraumatic. PERRLA, EOMI. Oropharynx is clear. Neck is nontender and supple without adenopathy or JVD. Back is nontender and there is no CVA tenderness. Lungs are clear without rales, wheezes, or rhonchi. Chest has well localized tenderness over the midportion of the left clavicle. There is  slight soft tissue swelling over this. Heart has regular rate and rhythm without murmur. Abdomen is soft, flat, nontender without masses or hepatosplenomegaly and peristalsis is normoactive. Extremities have no cyanosis or edema, full range of motion is present. Skin is warm and dry without rash. Neurologic: Mental status is normal, cranial nerves are intact, there are no motor or sensory deficits.  ED Course  Procedures (including critical care time) Imaging Review Dg Shoulder Left  11/02/2013   CLINICAL DATA:  Clavicular pain on the left after a fall.  EXAM: LEFT SHOULDER - 2+ VIEW  COMPARISON:  None.  FINDINGS: Acute mildly comminuted fracture of the midshaft left clavicle with inferior displacement and overriding of the distal fracture fragment. Acromioclavicular and coracoclavicular spaces appear intact. The glenohumeral joint is intact.  IMPRESSION: Acute fracture of the midshaft left clavicle.   Electronically Signed   By: Burman NievesWilliam  Stevens M.D.   On: 11/02/2013 05:14   Images viewed by me.  MDM   1. Fracture of left clavicle, closed, initial encounter    Fracture of the mid portion of the left clavicle. She is placed in a sling for comfort and sent home with prescription for oxycodone-acetaminophen for pain.    Dione Boozeavid Errick Salts, MD 11/03/13 [phone]

## 2014-04-03 HISTORY — PX: BREAST ENHANCEMENT SURGERY: SHX7

## 2014-09-04 ENCOUNTER — Encounter (HOSPITAL_COMMUNITY): Payer: Self-pay | Admitting: Emergency Medicine

## 2014-09-07 ENCOUNTER — Other Ambulatory Visit (HOSPITAL_COMMUNITY)
Admission: RE | Admit: 2014-09-07 | Discharge: 2014-09-07 | Disposition: A | Discharge status: Home / Self Care | Payer: Medicaid Other | Source: Ambulatory Visit | Attending: Internal Medicine | Admitting: Internal Medicine

## 2014-09-07 ENCOUNTER — Ambulatory Visit: Payer: Medicaid Other | Attending: Internal Medicine | Admitting: Internal Medicine

## 2014-09-07 ENCOUNTER — Encounter: Payer: Self-pay | Admitting: Internal Medicine

## 2014-09-07 VITALS — BP 106/68 | HR 81 | Temp 98.4°F | Resp 16 | Ht 59.0 in | Wt 113.8 lb

## 2014-09-07 DIAGNOSIS — N3000 Acute cystitis without hematuria: Secondary | ICD-10-CM | POA: Insufficient documentation

## 2014-09-07 DIAGNOSIS — Z23 Encounter for immunization: Secondary | ICD-10-CM

## 2014-09-07 DIAGNOSIS — Z01419 Encounter for gynecological examination (general) (routine) without abnormal findings: Secondary | ICD-10-CM | POA: Diagnosis present

## 2014-09-07 DIAGNOSIS — X58XXXA Exposure to other specified factors, initial encounter: Secondary | ICD-10-CM | POA: Diagnosis not present

## 2014-09-07 DIAGNOSIS — S60521A Blister (nonthermal) of right hand, initial encounter: Secondary | ICD-10-CM

## 2014-09-07 DIAGNOSIS — Z124 Encounter for screening for malignant neoplasm of cervix: Secondary | ICD-10-CM | POA: Diagnosis not present

## 2014-09-07 DIAGNOSIS — F17210 Nicotine dependence, cigarettes, uncomplicated: Secondary | ICD-10-CM | POA: Insufficient documentation

## 2014-09-07 DIAGNOSIS — Z113 Encounter for screening for infections with a predominantly sexual mode of transmission: Secondary | ICD-10-CM | POA: Insufficient documentation

## 2014-09-07 DIAGNOSIS — R350 Frequency of micturition: Secondary | ICD-10-CM

## 2014-09-07 DIAGNOSIS — N760 Acute vaginitis: Secondary | ICD-10-CM | POA: Diagnosis present

## 2014-09-07 DIAGNOSIS — Z0000 Encounter for general adult medical examination without abnormal findings: Secondary | ICD-10-CM | POA: Diagnosis present

## 2014-09-07 LAB — POCT URINALYSIS DIPSTICK
BILIRUBIN UA: NEGATIVE
GLUCOSE UA: NEGATIVE
Ketones, UA: NEGATIVE
NITRITE UA: NEGATIVE
Protein, UA: NEGATIVE
RBC UA: NEGATIVE
SPEC GRAV UA: 1.015
UROBILINOGEN UA: 0.2
pH, UA: 6

## 2014-09-07 MED ORDER — SULFAMETHOXAZOLE-TRIMETHOPRIM 800-160 MG PO TABS: 1.0000 | ORAL_TABLET | Freq: Two times a day (BID) | ORAL | Status: DC

## 2014-09-07 NOTE — Progress Notes (Signed)
Patient presents to establish care Reports no doctor visits in 3 years Requesting pap but does not want breast exam due to breast augmentation in July 2015 States 5 days ago had urinary frequency, pressure when voiding and malodorous urine Self treated with left over bactrim for one day and now asymptomati Smoking 1 cig per month

## 2014-09-07 NOTE — Patient Instructions (Signed)
Smoking Cessation Quitting smoking is important to your health and has many advantages. However, it is not always easy to quit since nicotine is a very addictive drug. Oftentimes, people try 3 times or more before being able to quit. This document explains the best ways for you to prepare to quit smoking. Quitting takes hard work and a lot of effort, but you can do it. ADVANTAGES OF QUITTING SMOKING  You will live longer, feel better, and live better.  Your body will feel the impact of quitting smoking almost immediately.  Within 20 minutes, blood pressure decreases. Your pulse returns to its normal level.  After 8 hours, carbon monoxide levels in the blood return to normal. Your oxygen level increases.  After 24 hours, the chance of having a heart attack starts to decrease. Your breath, hair, and body stop smelling like smoke.  After 48 hours, damaged nerve endings begin to recover. Your sense of taste and smell improve.  After 72 hours, the body is virtually free of nicotine. Your bronchial tubes relax and breathing becomes easier.  After 2 to 12 weeks, lungs can hold more air. Exercise becomes easier and circulation improves.  The risk of having a heart attack, stroke, cancer, or lung disease is greatly reduced.  After 1 year, the risk of coronary heart disease is cut in half.  After 5 years, the risk of stroke falls to the same as a nonsmoker.  After 10 years, the risk of lung cancer is cut in half and the risk of other cancers decreases significantly.  After 15 years, the risk of coronary heart disease drops, usually to the level of a nonsmoker.  If you are pregnant, quitting smoking will improve your chances of having a healthy baby.  The people you live with, especially any children, will be healthier.  You will have extra money to spend on things other than cigarettes. QUESTIONS TO THINK ABOUT BEFORE ATTEMPTING TO QUIT You may want to talk about your answers with your  health care provider.  Why do you want to quit?  If you tried to quit in the past, what helped and what did not?  What will be the most difficult situations for you after you quit? How will you plan to handle them?  Who can help you through the tough times? Your family? Friends? A health care provider?  What pleasures do you get from smoking? What ways can you still get pleasure if you quit? Here are some questions to ask your health care provider:  How can you help me to be successful at quitting?  What medicine do you think would be best for me and how should I take it?  What should I do if I need more help?  What is smoking withdrawal like? How can I get information on withdrawal? GET READY  Set a quit date.  Change your environment by getting rid of all cigarettes, ashtrays, matches, and lighters in your home, car, or work. Do not let people smoke in your home.  Review your past attempts to quit. Think about what worked and what did not. GET SUPPORT AND ENCOURAGEMENT You have a better chance of being successful if you have help. You can get support in many ways.  Tell your family, friends, and coworkers that you are going to quit and need their support. Ask them not to smoke around you.  Get individual, group, or telephone counseling and support. Programs are available at local hospitals and health centers. Call   your local health department for information about programs in your area.  Spiritual beliefs and practices may help some smokers quit.  Download a "quit meter" on your computer to keep track of quit statistics, such as how long you have gone without smoking, cigarettes not smoked, and money saved.  Get a self-help book about quitting smoking and staying off tobacco. LEARN NEW SKILLS AND BEHAVIORS  Distract yourself from urges to smoke. Talk to someone, go for a walk, or occupy your time with a task.  Change your normal routine. Take a different route to work.  Drink tea instead of coffee. Eat breakfast in a different place.  Reduce your stress. Take a hot bath, exercise, or read a book.  Plan something enjoyable to do every day. Reward yourself for not smoking.  Explore interactive web-based programs that specialize in helping you quit. GET MEDICINE AND USE IT CORRECTLY Medicines can help you stop smoking and decrease the urge to smoke. Combining medicine with the above behavioral methods and support can greatly increase your chances of successfully quitting smoking.  Nicotine replacement therapy helps deliver nicotine to your body without the negative effects and risks of smoking. Nicotine replacement therapy includes nicotine gum, lozenges, inhalers, nasal sprays, and skin patches. Some may be available over-the-counter and others require a prescription.  Antidepressant medicine helps people abstain from smoking, but how this works is unknown. This medicine is available by prescription.  Nicotinic receptor partial agonist medicine simulates the effect of nicotine in your brain. This medicine is available by prescription. Ask your health care provider for advice about which medicines to use and how to use them based on your health history. Your health care provider will tell you what side effects to look out for if you choose to be on a medicine or therapy. Carefully read the information on the package. Do not use any other product containing nicotine while using a nicotine replacement product.  RELAPSE OR DIFFICULT SITUATIONS Most relapses occur within the first 3 months after quitting. Do not be discouraged if you start smoking again. Remember, most people try several times before finally quitting. You may have symptoms of withdrawal because your body is used to nicotine. You may crave cigarettes, be irritable, feel very hungry, cough often, get headaches, or have difficulty concentrating. The withdrawal symptoms are only temporary. They are strongest  when you first quit, but they will go away within 10-14 days. To reduce the chances of relapse, try to:  Avoid drinking alcohol. Drinking lowers your chances of successfully quitting.  Reduce the amount of caffeine you consume. Once you quit smoking, the amount of caffeine in your body increases and can give you symptoms, such as a rapid heartbeat, sweating, and anxiety.  Avoid smokers because they can make you want to smoke.  Do not let weight gain distract you. Many smokers will gain weight when they quit, usually less than 10 pounds. Eat a healthy diet and stay active. You can always lose the weight gained after you quit.  Find ways to improve your mood other than smoking. FOR MORE INFORMATION  www.smokefree.gov  Document Released: 10/14/2001 Document Revised: 03/06/2014 Document Reviewed: 01/29/2012 ExitCare Patient Information 2015 [employer]. This information is not intended to replace advice given to you by your health care provider. Make sure you discuss any questions you have with your health care provider.  

## 2014-09-07 NOTE — Progress Notes (Signed)
Patient ID: Victoria Barnes, female   DOB: December 25, 1985, 28 y.o.   MRN: [id number]  [id number]  [id number]  DOB - December 25, 1985  CC:  Chief Complaint  Patient presents with  . Establish Care       HPI: Victoria Barnes is a [age] y.o. female here today to establish medical care.  Patient reports that she was recently treated for bacterial vaginosis last month and does not believe the infection is gone.  She continues to have a thick creamy discharge with no associated odor.  She has been having urinary frequency for about three days.  She denies dysuria, flank pain, hemauria, fever, chills, nausea, or vomiting.  She reports that she took some two left over Bactrim antibiotics she had at home and some OTC cranberry pills to help with the frequency.  .    Has episodes of lightening sensation that starts in her anti cubital region of her right arm with radiation down to the wrist.  After the electric sensation she describes that it progresses to distended arm veins, which then leads to open blisters on the palmer surface of her hands.  She has had episodes for the past four years. The blisters on her hands itch but are not painful.    Recently had breast augmentation for cosmetic purpose which was last month. Patient refuses breast exam.    Allergies  Allergen Reactions  . Keflex [Cephalexin] Nausea And Vomiting  . Penicillins Nausea And Vomiting   Past Medical History  Diagnosis Date  . Eczema 2012   No current outpatient prescriptions on file prior to visit.   No current facility-administered medications on file prior to visit.   Family History  Problem Relation Age of Onset  . Hyperlipidemia Mother    History   Social History  . Marital Status: Single    Spouse Name: N/A    Number of Children: N/A  . Years of Education: N/A   Occupational History  . Not on file.   Social History Main Topics  . Smoking status: Current Some Day Smoker  . Smokeless tobacco: Not on file   Comment: Smoking 1 cig/month  . Alcohol Use: No  . Drug Use: No  . Sexual Activity: Yes   Other Topics Concern  . Not on file   Social History Narrative    Review of Systems  Constitutional: Negative.   HENT: Negative.   Eyes: Negative.   Respiratory: Negative.   Cardiovascular: Negative.   Gastrointestinal: Negative.   Genitourinary: Positive for frequency. Negative for dysuria, urgency, hematuria and flank pain.  Skin: Positive for itching (blister on right hand).  Neurological: Negative.       Objective:   Filed Vitals:   09/07/14 1020  BP: 106/68  Pulse: 81  Temp: 98.4 F (36.9 C)  Resp: 16    Physical Exam  Constitutional: She is oriented to person, place, and time.  Cardiovascular: Normal rate, regular rhythm and normal heart sounds.   Pulmonary/Chest: Effort normal and breath sounds normal.  Refused breast exam  Abdominal: Soft. Bowel sounds are normal. She exhibits no distension. There is no tenderness.  No CVA tenderness   Genitourinary: Rectum normal and uterus normal. Cervix exhibits no motion tenderness, no discharge and no friability. Right adnexum displays no tenderness. Left adnexum displays no tenderness. Vaginal discharge (strong odor noted) found.  Lymphadenopathy:       Right: No inguinal adenopathy present.       Left: No inguinal adenopathy present.  Neurological: She  is alert and oriented to person, place, and time. She has normal reflexes.  Skin: Skin is warm and dry. No rash noted.     Psychiatric: She has a normal mood and affect.     Lab Results  Component Value Date   WBC 6.5 11/29/2012   HGB 12.4 11/29/2012   HCT 35.5* 11/29/2012   MCV 87.9 11/29/2012   PLT 198 11/29/2012   Lab Results  Component Value Date   CREATININE 0.77 10/13/2010   BUN 7 10/13/2010   NA 140 10/13/2010   K 3.8 10/13/2010   CL 108 10/13/2010   CO2 25 10/13/2010    No results found for: HGBA1C Lipid Panel  No results found for: CHOL, TRIG, HDL,  CHOLHDL, VLDL, LDLCALC     Assessment and plan:   Victoria Barnes was seen today for establish care.  Diagnoses and associated orders for this visit:  Acute cystitis without hematuria - sulfamethoxazole-trimethoprim (BACTRIM DS,SEPTRA DS) 800-160 MG per tablet; Take 1 tablet by mouth 2 (two) times daily.  Urine frequency - Urinalysis Dipstick  Papanicolaou smear - Cytology - PAP [place] - Cervicovaginal ancillary only - HIV antibody (with reflex) - RPR  Blister of hand, right, initial encounter - CBC with Differential - COMPLETE METABOLIC PANEL WITH GFR  Need for prophylactic vaccination and inoculation against influenza - Flu Vaccine QUAD 36+ mos PF IM (Fluarix Quad PF)   Return if symptoms worsen or fail to improve.     Hol[place] CommonsKECK, Tikisha Molinaro, NP-C [facility] [phone] 09/11/2014, 10:47 AM

## 2014-09-08 LAB — CBC WITH DIFFERENTIAL/PLATELET
BASOS ABS: 0 10*3/uL (ref 0.0–0.1)
BASOS PCT: 0 % (ref 0–1)
EOS PCT: 6 % — AB (ref 0–5)
Eosinophils Absolute: 0.4 10*3/uL (ref 0.0–0.7)
HEMATOCRIT: 37.3 % (ref 36.0–46.0)
Hemoglobin: 11.9 g/dL — ABNORMAL LOW (ref 12.0–15.0)
Lymphocytes Relative: 27 % (ref 12–46)
Lymphs Abs: 1.8 10*3/uL (ref 0.7–4.0)
MCH: 26.9 pg (ref 26.0–34.0)
MCHC: 31.9 g/dL (ref 30.0–36.0)
MCV: 84.2 fL (ref 78.0–100.0)
MONO ABS: 0.3 10*3/uL (ref 0.1–1.0)
Monocytes Relative: 5 % (ref 3–12)
Neutro Abs: 4 10*3/uL (ref 1.7–7.7)
Neutrophils Relative %: 62 % (ref 43–77)
PLATELETS: 213 10*3/uL (ref 150–400)
RBC: 4.43 MIL/uL (ref 3.87–5.11)
RDW: 15.1 % (ref 11.5–15.5)
WBC: 6.5 10*3/uL (ref 4.0–10.5)

## 2014-09-08 LAB — COMPLETE METABOLIC PANEL WITH GFR
ALK PHOS: 50 U/L (ref 39–117)
ALT: 12 U/L (ref 0–35)
AST: 18 U/L (ref 0–37)
Albumin: 4.2 g/dL (ref 3.5–5.2)
BILIRUBIN TOTAL: 0.4 mg/dL (ref 0.2–1.2)
BUN: 9 mg/dL (ref 6–23)
CO2: 21 mEq/L (ref 19–32)
CREATININE: 0.81 mg/dL (ref 0.50–1.10)
Calcium: 9.1 mg/dL (ref 8.4–10.5)
Chloride: 103 mEq/L (ref 96–112)
GFR, Est African American: >89 mL/min
GFR, Est Non African American: >89 mL/min
Glucose, Bld: 89 mg/dL (ref 70–99)
Potassium: 4.6 mEq/L (ref 3.5–5.3)
Sodium: 135 mEq/L (ref 135–145)
Total Protein: 6.9 g/dL (ref 6.0–8.3)

## 2014-09-08 LAB — CYTOLOGY - PAP

## 2014-09-08 LAB — CERVICOVAGINAL ANCILLARY ONLY
CHLAMYDIA, DNA PROBE: NEGATIVE
NEISSERIA GONORRHEA: NEGATIVE
WET PREP (BD AFFIRM): NEGATIVE
WET PREP (BD AFFIRM): POSITIVE — AB
WET PREP (BD AFFIRM): POSITIVE — AB

## 2014-09-08 LAB — HIV ANTIBODY (ROUTINE TESTING W REFLEX): HIV 1&2 Ab, 4th Generation: NONREACTIVE

## 2014-09-08 LAB — RPR

## 2014-09-13 ENCOUNTER — Other Ambulatory Visit: Payer: Self-pay | Admitting: Emergency Medicine

## 2014-09-13 ENCOUNTER — Telehealth: Payer: Self-pay | Admitting: Emergency Medicine

## 2014-09-13 MED ORDER — METRONIDAZOLE 0.75 % VA GEL: 1.0000 | Freq: Two times a day (BID) | VAGINAL | Status: DC

## 2014-09-13 MED ORDER — FLUCONAZOLE 150 MG PO TABS: 150.0000 mg | ORAL_TABLET | Freq: Once | ORAL | Status: DC

## 2014-09-13 NOTE — Telephone Encounter (Signed)
Attempted to reach pt with results,number listed busy Will try again

## 2014-09-13 NOTE — Telephone Encounter (Signed)
-----   Message from Ambrose FinlandValerie A Keck, NP sent at 09/11/2014  2:03 PM EST ----- Pap negative for malignancies and GC/Chlamydia. Will repeat in three years. Make sure she gets previous notes about yeast and BV. Thanks

## 2014-09-13 NOTE — Telephone Encounter (Signed)
-----   Message from Ambrose FinlandValerie A Keck, NP sent at 09/08/2014  9:31 AM EST ----- patient HIV and syphilis is negative. Positive for yeast and bacterial vaginosis. please send MetroGel 1 applicator twice per day for 5 days. Also send Diflucan 150 mg by mouth once, with one refill. Still waiting on gonorrhea Chlamydia and Pap results

## 2014-09-13 NOTE — Telephone Encounter (Signed)
Pt given test results with instructions to start taking prescribed Metrogel 1 applicator BID x 5 dys, Diflucan 150 mg tab Medications [facility] Informed we are waiting on Gonorrhea/Chlamydia results

## 2014-10-02 ENCOUNTER — Telehealth: Payer: Self-pay | Admitting: *Deleted

## 2014-10-02 NOTE — Telephone Encounter (Signed)
Left message with negative results, if any question return call

## 2014-10-02 NOTE — Telephone Encounter (Signed)
-----   Message from Valerie A Keck, NP sent at 09/11/2014  2:03 PM EST ----- Pap negative for malignancies and GC/Chlamydia. Will repeat in three years. Make sure she gets previous notes about yeast and BV. Thanks 

## 2014-11-23 ENCOUNTER — Other Ambulatory Visit (HOSPITAL_COMMUNITY)
Admission: RE | Admit: 2014-11-23 | Discharge: 2014-11-23 | Disposition: A | Discharge status: Home / Self Care | Payer: Medicaid Other | Source: Ambulatory Visit | Attending: Internal Medicine | Admitting: Internal Medicine

## 2014-11-23 ENCOUNTER — Encounter: Payer: Self-pay | Admitting: Internal Medicine

## 2014-11-23 ENCOUNTER — Ambulatory Visit: Payer: Medicaid Other | Attending: Internal Medicine | Admitting: Internal Medicine

## 2014-11-23 VITALS — BP 118/78 | HR 86 | Temp 98.1°F | Resp 16 | Ht 59.0 in | Wt 117.0 lb

## 2014-11-23 DIAGNOSIS — Z309 Encounter for contraceptive management, unspecified: Secondary | ICD-10-CM | POA: Diagnosis not present

## 2014-11-23 DIAGNOSIS — N760 Acute vaginitis: Secondary | ICD-10-CM

## 2014-11-23 DIAGNOSIS — Z30011 Encounter for initial prescription of contraceptive pills: Secondary | ICD-10-CM | POA: Diagnosis not present

## 2014-11-23 DIAGNOSIS — Z114 Encounter for screening for human immunodeficiency virus [HIV]: Secondary | ICD-10-CM | POA: Diagnosis not present

## 2014-11-23 DIAGNOSIS — F17210 Nicotine dependence, cigarettes, uncomplicated: Secondary | ICD-10-CM | POA: Diagnosis not present

## 2014-11-23 DIAGNOSIS — Z113 Encounter for screening for infections with a predominantly sexual mode of transmission: Secondary | ICD-10-CM

## 2014-11-23 DIAGNOSIS — N898 Other specified noninflammatory disorders of vagina: Secondary | ICD-10-CM | POA: Diagnosis not present

## 2014-11-23 LAB — POCT URINALYSIS DIPSTICK
BILIRUBIN UA: NEGATIVE
Blood, UA: NEGATIVE
GLUCOSE UA: NEGATIVE
KETONES UA: NEGATIVE
Nitrite, UA: NEGATIVE
PH UA: 5.5
PROTEIN UA: NEGATIVE
Spec Grav, UA: >=1.03
Urobilinogen, UA: 0.2

## 2014-11-23 LAB — POCT URINE PREGNANCY: PREG TEST UR: NEGATIVE

## 2014-11-23 MED ORDER — NORETHINDRONE 0.35 MG PO TABS: 1.0000 | ORAL_TABLET | Freq: Every day | ORAL | Status: DC

## 2014-11-23 NOTE — Progress Notes (Signed)
Pt is here today b/c she had unprotected sex and now she has milky discharge w/ light itching.

## 2014-11-23 NOTE — Progress Notes (Signed)
Patient ID: Victoria Barnes, female   DOB: 1986-07-18, 29 y.o.   MRN: [id number]  CC: vaginal discharge  HPI: Victoria Barnes is a 29 y.o. female here today for a follow up visit.  Patient has past medical history of eczema only.  She states that she had unprotected intercourse 21 days ago and has now noticed a vaginal discharge. She noticed the thin white vaginal discharge one week ago. She denies vaginal itch, odor, dysuria, or lesions. She would like STD testing.  Patient is also interested in birth control options.  She reports that she was on Depo-provera injections in the past which caused her hair loss.  She reports combination OCP in the past but feel that they made her emotionally unstable.  Patient has No headache, No chest pain, No abdominal pain - No Nausea, No new weakness tingling or numbness, No Cough - SOB.  Allergies  Allergen Reactions  . Keflex [Cephalexin] Nausea And Vomiting  . Penicillins Nausea And Vomiting   Past Medical History  Diagnosis Date  . Eczema 2012   Current Outpatient Prescriptions on File Prior to Visit  Medication Sig Dispense Refill  . fluconazole (DIFLUCAN) 150 MG tablet Take 1 tablet (150 mg total) by mouth once. (Patient not taking: Reported on 11/23/2014) 1 tablet 0  . metroNIDAZOLE (METROGEL) 0.75 % vaginal gel Place 1 Applicatorful vaginally 2 (two) times daily. Use applicator twice a day x 5 days (Patient not taking: Reported on 11/23/2014) 70 g 0  . sulfamethoxazole-trimethoprim (BACTRIM DS,SEPTRA DS) 800-160 MG per tablet Take 1 tablet by mouth 2 (two) times daily. (Patient not taking: Reported on 11/23/2014) 6 tablet 0   No current facility-administered medications on file prior to visit.   Family History  Problem Relation Age of Onset  . Hyperlipidemia Mother    History   Social History  . Marital Status: Single    Spouse Name: N/A    Number of Children: N/A  . Years of Education: N/A   Occupational History  . Not on file.   Social  History Main Topics  . Smoking status: Current Some Day Smoker  . Smokeless tobacco: Not on file     Comment: Smoking 1 cig/month  . Alcohol Use: No  . Drug Use: No  . Sexual Activity: Yes   Other Topics Concern  . Not on file   Social History Narrative    Review of Systems: Constitutional: Negative for fever, chills, diaphoresis, activity change, appetite change and fatigue. HENT: Negative for ear pain, nosebleeds, congestion, facial swelling, rhinorrhea, neck pain, neck stiffness and ear discharge.  Eyes: Negative for pain, discharge, redness, itching and visual disturbance. Respiratory: Negative for cough, choking, chest tightness, shortness of breath, wheezing and stridor.  Cardiovascular: Negative for chest pain, palpitations and leg swelling. Gastrointestinal: Negative for abdominal distention. Genitourinary: Negative for dysuria, urgency, frequency, hematuria, flank pain, decreased urine volume, difficulty urinating and dyspareunia.  Musculoskeletal: Negative for back pain, joint swelling, arthralgias and gait problem. Neurological: Negative for dizziness, tremors, seizures, syncope, facial asymmetry, speech difficulty, weakness, light-headedness, numbness and headaches.  Hematological: Negative for adenopathy. Does not bruise/bleed easily. Psychiatric/Behavioral: Negative for hallucinations, behavioral problems, confusion, dysphoric mood, decreased concentration and agitation.    Objective:   Filed Vitals:   11/23/14 1706  BP: 118/78  Pulse: 86  Temp: 98.1 F (36.7 C)  Resp: 16    Physical Exam  Genitourinary: Uterus normal. Cervix exhibits no motion tenderness, no discharge and no friability. Right adnexum displays no tenderness. Left  adnexum displays no tenderness. Vaginal discharge found.  Lymphadenopathy:       Right: No inguinal adenopathy present.       Left: No inguinal adenopathy present.     Lab Results  Component Value Date   WBC 6.5 09/07/2014    HGB 11.9* 09/07/2014   HCT 37.3 09/07/2014   MCV 84.2 09/07/2014   PLT 213 09/07/2014   Lab Results  Component Value Date   CREATININE 0.81 09/07/2014   BUN 9 09/07/2014   NA 135 09/07/2014   K 4.6 09/07/2014   CL 103 09/07/2014   CO2 21 09/07/2014    No results found for: HGBA1C Lipid Panel  No results found for: CHOL, TRIG, HDL, CHOLHDL, VLDL, LDLCALC     Assessment and plan:   Victoria Barnes was seen today for follow-up.  Diagnoses and associated orders for this visit:  Vaginal discharge - Urinalysis Dipstick - Cervicovaginal ancillary only - HIV antibody (with reflex) - RPR - RPR; Future - HIV antibody (with reflex); Future  BCP (birth control pills) initiation - POCT urine pregnancy - Begin norethindrone (ORTHO MICRONOR) 0.35 MG tablet; Take 1 tablet (0.35 mg total) by mouth daily.   Return if symptoms worsen or fail to improve.        Hol[place] CommonsKECK, Sausha Raymond, NP-C [facility] [phone] 11/23/2014, 5:36 PM

## 2014-11-23 NOTE — Patient Instructions (Signed)
Norethindrone tablets (contraception) What is this medicine? NORETHINDRONE (nor eth IN drone) is an oral contraceptive. The product contains a female hormone known as a progestin. It is used to prevent pregnancy. This medicine may be used for other purposes; ask your health care provider or pharmacist if you have questions. COMMON BRAND NAME(S): Camila, Deblitane 28-Day, Errin, Heather, Jencycla, Jolivette, Lyza, Nor-QD, Nora-BE, Norlyroc, Ortho Micronor, Sharobel 28-Day What should I tell my health care provider before I take this medicine? They need to know if you have any of these conditions: -blood vessel disease or blood clots -breast, cervical, or vaginal cancer -diabetes -heart disease -kidney disease -liver disease -mental depression -migraine -seizures -stroke -vaginal bleeding -an unusual or allergic reaction to norethindrone, other medicines, foods, dyes, or preservatives -pregnant or trying to get pregnant -breast-feeding How should I use this medicine? Take this medicine by mouth with a glass of water. You may take it with or without food. Follow the directions on the prescription label. Take this medicine at the same time each day and in the order directed on the package. Do not take your medicine more often than directed. Contact your pediatrician regarding the use of this medicine in children. Special care may be needed. This medicine has been used in female children who have started having menstrual periods. A patient package insert for the product will be given with each prescription and refill. Read this sheet carefully each time. The sheet may change frequently. Overdosage: If you think you have taken too much of this medicine contact a poison control center or emergency room at once. NOTE: This medicine is only for you. Do not share this medicine with others. What if I miss a dose? Try not to miss a dose. Every time you miss a dose or take a dose late your chance of  pregnancy increases. When 1 pill is missed (even if only 3 hours late), take the missed pill as soon as possible and continue taking a pill each day at the regular time (use a back up method of birth control for the next 48 hours). If more than 1 dose is missed, use an additional birth control method for the rest of your pill pack until menses occurs. Contact your health care professional if more than 1 dose has been missed. What may interact with this medicine? Do not take this medicine with any of the following medications: -amprenavir or fosamprenavir -bosentan This medicine may also interact with the following medications: -antibiotics or medicines for infections, especially rifampin, rifabutin, rifapentine, and griseofulvin, and possibly penicillins or tetracyclines -aprepitant -barbiturate medicines, such as phenobarbital -carbamazepine -felbamate -modafinil -oxcarbazepine -phenytoin -ritonavir or other medicines for HIV infection or AIDS -St. John's wort -topiramate This list may not describe all possible interactions. Give your health care provider a list of all the medicines, herbs, non-prescription drugs, or dietary supplements you use. Also tell them if you smoke, drink alcohol, or use illegal drugs. Some items may interact with your medicine. What should I watch for while using this medicine? Visit your doctor or health care professional for regular checks on your progress. You will need a regular breast and pelvic exam and Pap smear while on this medicine. Use an additional method of birth control during the first cycle that you take these tablets. If you have any reason to think you are pregnant, stop taking this medicine right away and contact your doctor or health care professional. If you are taking this medicine for hormone related problems, it   may take several cycles of use to see improvement in your condition. This medicine does not protect you against HIV infection (AIDS)  or any other sexually transmitted diseases. What side effects may I notice from receiving this medicine? Side effects that you should report to your doctor or health care professional as soon as possible: -breast tenderness or discharge -pain in the abdomen, chest, groin or leg -severe headache -skin rash, itching, or hives -sudden shortness of breath -unusually weak or tired -vision or speech problems -yellowing of skin or eyes Side effects that usually do not require medical attention (report to your doctor or health care professional if they continue or are bothersome): -changes in sexual desire -change in menstrual flow -facial hair growth -fluid retention and swelling -headache -irritability -nausea -weight gain or loss This list may not describe all possible side effects. Call your doctor for medical advice about side effects. You may report side effects to FDA at 1-800-FDA-1088. Where should I keep my medicine? Keep out of the reach of children. Store at room temperature between 15 and 30 degrees C (59 and 86 degrees F). Throw away any unused medicine after the expiration date. NOTE: This sheet is a summary. It may not cover all possible information. If you have questions about this medicine, talk to your doctor, pharmacist, or health care provider.  2015, [employer]. (2012-07-09 16:41:35)  

## 2014-11-28 LAB — CERVICOVAGINAL ANCILLARY ONLY
CHLAMYDIA, DNA PROBE: NEGATIVE
Neisseria Gonorrhea: NEGATIVE
WET PREP (BD AFFIRM): NEGATIVE
WET PREP (BD AFFIRM): POSITIVE — AB
Wet Prep (BD Affirm): POSITIVE — AB

## 2014-11-30 ENCOUNTER — Telehealth: Payer: Self-pay | Admitting: *Deleted

## 2014-11-30 MED ORDER — METRONIDAZOLE 500 MG PO TABS
500.0000 mg | ORAL_TABLET | Freq: Two times a day (BID) | ORAL | Status: DC
Start: 2014-11-30 — End: 2015-04-10

## 2014-11-30 MED ORDER — FLUCONAZOLE 150 MG PO TABS: 150.0000 mg | ORAL_TABLET | Freq: Every day | ORAL | Status: DC

## 2014-11-30 NOTE — Telephone Encounter (Signed)
-----   Message from Ambrose FinlandValerie A Keck, NP sent at 11/29/2014  5:52 PM EST ----- Patient positive for Bacterial Vaginosis . Please explain this is not a STD, but a imbalance of the vaginal pH. Please send Flagyl 500 mg BID for 7 days. No refills, no alcohol while on this medication. Also positive for yeast, send diflucan 150 mg PO Once. Send one tablet with one refill.

## 2014-11-30 NOTE — Telephone Encounter (Signed)
Rx send to [facility]. Pt aware of lab results

## 2014-12-08 ENCOUNTER — Other Ambulatory Visit: Payer: Self-pay | Admitting: Internal Medicine

## 2015-02-14 ENCOUNTER — Ambulatory Visit: Payer: Medicaid Other | Attending: Internal Medicine | Admitting: Internal Medicine

## 2015-02-14 ENCOUNTER — Encounter: Payer: Self-pay | Admitting: Internal Medicine

## 2015-02-14 VITALS — BP 126/78 | HR 72 | Temp 98.0°F | Resp 16 | Wt 115.2 lb

## 2015-02-14 DIAGNOSIS — L309 Dermatitis, unspecified: Secondary | ICD-10-CM

## 2015-02-14 DIAGNOSIS — Z7952 Long term (current) use of systemic steroids: Secondary | ICD-10-CM | POA: Insufficient documentation

## 2015-02-14 DIAGNOSIS — R41840 Attention and concentration deficit: Secondary | ICD-10-CM | POA: Diagnosis not present

## 2015-02-14 DIAGNOSIS — F17210 Nicotine dependence, cigarettes, uncomplicated: Secondary | ICD-10-CM | POA: Insufficient documentation

## 2015-02-14 MED ORDER — TRIAMCINOLONE ACETONIDE 0.1 % EX CREA: 1.0000 "application " | TOPICAL_CREAM | Freq: Two times a day (BID) | CUTANEOUS | Status: DC

## 2015-02-14 NOTE — Progress Notes (Signed)
Patient states she thinks she might had ADHD Patient states she is having trouble focusing,figitiy Her mind races , not sleeping well Patient also states she finds her self being late for things because She can not concentrate

## 2015-02-14 NOTE — Progress Notes (Signed)
Patient ID: Victoria Barnes, female   DOB: 16-Dec-1985, 29 y.o.   MRN: [id number]  CC: Difficulty concentrating  HPI: Victoria Antisbony Cappella is a 29 y.o. female here today for a follow up visit.  Patient has no significant past medical history. Patient states she thinks she might had ADHD. For past 6 months she has concerns of losing train of thought, having a hard time interviewing since she is almost finished with school, not retaining info in school. Feels like she gets overly excited and interrupts people talking to her. She feels distracted, unorganized, and fidgety.  She states that she had similar problems while in grade school but managed to have good grades. She remembers as a child being told to calm down often.   Patient has No headache, No chest pain, No abdominal pain - No Nausea, No new weakness tingling or numbness, No Cough - SOB.  Allergies  Allergen Reactions  . Keflex [Cephalexin] Nausea And Vomiting  . Penicillins Nausea And Vomiting   Past Medical History  Diagnosis Date  . Eczema 2012   Current Outpatient Prescriptions on File Prior to Visit  Medication Sig Dispense Refill  . benzonatate (TESSALON) 100 MG capsule Take 100 mg by mouth 3 (three) times daily as needed for cough.    . fluconazole (DIFLUCAN) 150 MG tablet Take 1 tablet (150 mg total) by mouth daily. 1 tablet 0  . metroNIDAZOLE (FLAGYL) 500 MG tablet Take 1 tablet (500 mg total) by mouth 2 (two) times daily. 14 tablet 0  . norethindrone (ORTHO MICRONOR) 0.35 MG tablet Take 1 tablet (0.35 mg total) by mouth daily. 1 Package 11  . predniSONE (DELTASONE) 10 MG tablet Take 10 mg by mouth daily with breakfast.     No current facility-administered medications on file prior to visit.   Family History  Problem Relation Age of Onset  . Hyperlipidemia Mother    History   Social History  . Marital Status: Single    Spouse Name: N/A  . Number of Children: N/A  . Years of Education: N/A   Occupational History  . Not on  file.   Social History Main Topics  . Smoking status: Current Some Day Smoker  . Smokeless tobacco: Not on file     Comment: Smoking 1 cig/month  . Alcohol Use: No  . Drug Use: No  . Sexual Activity: Yes   Other Topics Concern  . Not on file   Social History Narrative    Review of Systems  Skin: Positive for rash (eczema).  Psychiatric/Behavioral: Negative for depression and substance abuse. The patient is not nervous/anxious.   All other systems reviewed and are negative.     Objective:   Filed Vitals:   02/14/15 1236  BP: 126/78  Pulse: 72  Temp: 98 F (36.7 C)  Resp: 16    Physical Exam  Constitutional: She is oriented to person, place, and time.  Neurological: She is alert and oriented to person, place, and time.  Skin:  Right arm with dry patches that appear to be a eczema flare   .  Lab Results  Component Value Date   WBC 6.5 09/07/2014   HGB 11.9* 09/07/2014   HCT 37.3 09/07/2014   MCV 84.2 09/07/2014   PLT 213 09/07/2014   Lab Results  Component Value Date   CREATININE 0.81 09/07/2014   BUN 9 09/07/2014   NA 135 09/07/2014   K 4.6 09/07/2014   CL 103 09/07/2014   CO2 21 09/07/2014  No results found for: HGBA1C Lipid Panel  No results found for: CHOL, TRIG, HDL, CHOLHDL, VLDL, LDLCALC     Assessment and plan:   Addisen was seen today for follow-up.  Diagnoses and all orders for this visit:  Difficulty concentrating I have given patient resources to go to [facility] for official testing. I have explained to her that she will need to see a specialist for proper testing, diagnoses, and possible treatment.   Eczema Orders: -    Refilltriamcinolone cream (KENALOG) 0.1 %; Apply 1 application topically 2 (two) times daily.   May f/u as needed.      Holland Commons, NP-C [facility] [phone] 02/14/2015, 12:41 PM

## 2015-04-10 ENCOUNTER — Encounter: Payer: Self-pay | Admitting: Family Medicine

## 2015-04-10 ENCOUNTER — Ambulatory Visit (INDEPENDENT_AMBULATORY_CARE_PROVIDER_SITE_OTHER): Payer: 59 | Admitting: Family Medicine

## 2015-04-10 VITALS — BP 129/86 | Ht 59.0 in | Wt 119.0 lb

## 2015-04-10 DIAGNOSIS — Z202 Contact with and (suspected) exposure to infections with a predominantly sexual mode of transmission: Secondary | ICD-10-CM | POA: Diagnosis not present

## 2015-04-10 DIAGNOSIS — N760 Acute vaginitis: Secondary | ICD-10-CM | POA: Diagnosis not present

## 2015-04-10 DIAGNOSIS — F411 Generalized anxiety disorder: Secondary | ICD-10-CM

## 2015-04-10 MED ORDER — METRONIDAZOLE 0.75 % VA GEL: VAGINAL | Status: DC

## 2015-04-10 MED ORDER — FLUCONAZOLE 150 MG PO TABS: ORAL_TABLET | ORAL | Status: DC

## 2015-04-10 MED ORDER — VENLAFAXINE HCL ER 37.5 MG PO CP24: 37.5000 mg | ORAL_CAPSULE | Freq: Every day | ORAL | Status: DC

## 2015-04-10 NOTE — Progress Notes (Signed)
CC: Victoria Barnes is a 29 y.o. female is here for Establish Care and would like to be checked for STD's   Subjective: HPI:  Pleasant 29 year old here to establish care  Reports a history for at least the last 3 months of her mind racing and counseling feeling like she has things to do. Symptoms are worsened by the fact that she started a new job, continues to go to school and has to take care of her 27-year-old son is a single mother. She denies depression but does report that she feels overly anxious compared to most people.  She also has trouble falling asleep because her mind is thinking about all her responsibilities, she has difficulty enjoying happiness because she feels like they're always feels like there is some responsibility that she is forgetting about while having fun.  Complains of vaginal discharge, foul odor and itching that has been present off and on for matter of years. It usually improves with Diflucan or MetroGel. Symptoms are worse around the time that she is taking anti-biotics for any other issue.  She is pretty sure it feels like prior episodes of BV and candidiasis. No abnormal vaginal bleeding  Within the last 6 months she's had unprotected sex with a new partner. It's possible this individual could have a STD and has been withholding information from her. She would like to know if she has contracted anything. Fortunately she denies any genitourinary complaints or skin changes other than that described above.   Review Of Systems Outlined In HPI  Past Medical History  Diagnosis Date  . Eczema 2012    Past Surgical History  Procedure Laterality Date  . Wisdom tooth extraction    . Breast enhancement surgery Bilateral June 2015   Family History  Problem Relation Age of Onset  . Hyperlipidemia Mother     History   Social History  . Marital Status: Single    Spouse Name: N/A  . Number of Children: N/A  . Years of Education: N/A   Occupational History  . Not  on file.   Social History Main Topics  . Smoking status: Current Some Day Smoker  . Smokeless tobacco: Not on file     Comment: Smoking 1 cig/month  . Alcohol Use: 2.4 oz/week    4 Standard drinks or equivalent per week  . Drug Use: No  . Sexual Activity:    Partners: Male    Birth Control/ Protection: Condom   Other Topics Concern  . Not on file   Social History Narrative     Objective: BP 129/86 mmHg  Ht 4\' 11"  (1.499 m)  Wt 119 lb (53.978 kg)  BMI 24.02 kg/m2  Vital signs reviewed. General: Alert and Oriented, No Acute Distress HEENT: Pupils equal, round, reactive to light. Conjunctivae clear.  External ears unremarkable.  Moist mucous membranes. Lungs: Clear and comfortable work of breathing, speaking in full sentences without accessory muscle use. Cardiac: Regular rate and rhythm.  Neuro: CN II-XII grossly intact, gait normal. Extremities: No peripheral edema.  Strong peripheral pulses.  Mental Status: No depression, anxiety, nor agitation. Logical though process. Skin: Warm and dry. Assessment & Plan: Victoria Barnes was seen today for establish care and would like to be checked for std's.  Diagnoses and all orders for this visit:  Vaginitis and vulvovaginitis Orders: -     WET PREP FOR TRICH, YEAST, CLUE -     GC/chlamydia probe amp, urine -     HIV antibody -  RPR -     Hepatitis C Antibody -     fluconazole (DIFLUCAN) 150 MG tablet; Take one tab, may take second tab if no improvement after 72 hours. -     metroNIDAZOLE (METROGEL) 0.75 % vaginal gel; One applicatorful delivered into vagina every evening for five days.  Exposure to STD Orders: -     GC/chlamydia probe amp, urine -     HIV antibody -     RPR -     Hepatitis C Antibody  Generalized anxiety disorder Orders: -     venlafaxine XR (EFFEXOR XR) 37.5 MG 24 hr capsule; Take 1 capsule (37.5 mg total) by mouth daily with breakfast.   Vulvovaginitis: Wet prep was obtained, presents for treatment with  metronidazole and fluconazole provided. Since she gets this recurrently and she is aware of her symptoms when they reflect BV or yeast infection refills were provided. Generalized anxiety disorder: Mild in severity but significantly interfering quality of life therefore start low-dose Effexor    Return for Schedule CPE in the next 3 months, let me know if you're benefiting from generic Effexor next week.[name]

## 2015-04-11 LAB — RPR

## 2015-04-11 LAB — WET PREP, GENITAL

## 2015-04-11 LAB — GC/CHLAMYDIA PROBE AMP, URINE
CHLAMYDIA, SWAB/URINE, PCR: NEGATIVE
GC PROBE AMP, URINE: NEGATIVE

## 2015-04-11 LAB — HIV ANTIBODY (ROUTINE TESTING W REFLEX): HIV 1&2 Ab, 4th Generation: NONREACTIVE

## 2015-04-11 LAB — HEPATITIS C ANTIBODY: HCV Ab: NEGATIVE

## 2015-04-11 NOTE — Addendum Note (Signed)
Addended by: Wyline BeadyMCCRIMMON, Lannis Lichtenwalner C on: 04/11/2015 04:34 PM   Modules accepted: Orders

## 2015-04-12 ENCOUNTER — Other Ambulatory Visit: Payer: Self-pay | Admitting: *Deleted

## 2015-04-12 LAB — WET PREP FOR TRICH, YEAST, CLUE
TRICH WET PREP: NONE SEEN
WBC WET PREP: NONE SEEN
Yeast Wet Prep HPF POC: NONE SEEN

## 2015-04-12 MED ORDER — FEXOFENADINE HCL 180 MG PO TABS: 180.0000 mg | ORAL_TABLET | Freq: Every day | ORAL | Status: DC

## 2015-05-08 ENCOUNTER — Other Ambulatory Visit: Payer: Self-pay | Admitting: *Deleted

## 2015-05-08 DIAGNOSIS — F411 Generalized anxiety disorder: Secondary | ICD-10-CM

## 2015-05-08 MED ORDER — VENLAFAXINE HCL ER 37.5 MG PO CP24: 37.5000 mg | ORAL_CAPSULE | Freq: Every day | ORAL | Status: DC

## 2015-05-18 ENCOUNTER — Telehealth: Payer: Self-pay | Admitting: Family Medicine

## 2015-05-18 DIAGNOSIS — L309 Dermatitis, unspecified: Secondary | ICD-10-CM

## 2015-05-18 MED ORDER — CLOBETASOL PROP EMOLLIENT BASE 0.05 % EX CREA: TOPICAL_CREAM | CUTANEOUS | Status: DC

## 2015-05-18 NOTE — Telephone Encounter (Signed)
Shows me what looks like dyshidrotic eczema, triamcinolone cream not helping, will try clobetasol

## 2015-05-29 ENCOUNTER — Telehealth: Payer: Self-pay | Admitting: *Deleted

## 2015-05-29 DIAGNOSIS — Z3009 Encounter for other general counseling and advice on contraception: Secondary | ICD-10-CM

## 2015-05-29 NOTE — Telephone Encounter (Signed)
Pt requests referral to GYN to discuss mirena and placement

## 2015-06-07 ENCOUNTER — Ambulatory Visit (INDEPENDENT_AMBULATORY_CARE_PROVIDER_SITE_OTHER): Payer: 59 | Admitting: Obstetrics & Gynecology

## 2015-06-07 ENCOUNTER — Encounter: Payer: Self-pay | Admitting: Obstetrics & Gynecology

## 2015-06-07 VITALS — BP 129/83 | HR 81 | Resp 16 | Ht 59.0 in | Wt 117.0 lb

## 2015-06-07 DIAGNOSIS — Z30430 Encounter for insertion of intrauterine contraceptive device: Secondary | ICD-10-CM

## 2015-06-07 DIAGNOSIS — N898 Other specified noninflammatory disorders of vagina: Secondary | ICD-10-CM | POA: Diagnosis not present

## 2015-06-07 DIAGNOSIS — Z01812 Encounter for preprocedural laboratory examination: Secondary | ICD-10-CM | POA: Diagnosis not present

## 2015-06-07 LAB — POCT URINE PREGNANCY: Preg Test, Ur: NEGATIVE

## 2015-06-07 MED ORDER — HYLAFEM VA SUPP: 1.0000 | Freq: Every day | VAGINAL | Status: AC

## 2015-06-07 MED ORDER — LEVONORGESTREL 13.5 MG IU IUD
1.0000 | INTRAUTERINE_SYSTEM | Freq: Once | INTRAUTERINE | Status: AC
  Administered 2015-06-07: 1 via INTRAUTERINE

## 2015-06-07 NOTE — Progress Notes (Signed)
   Subjective:      Patient ID: Victoria Barnes, female    DOB: 1986/09/06, 29 y.o.   MRN: [id number]  HPI  29 yo S AAP1 (22 yo son) here for [facility] insertion. She is currently using condoms.  Review of Systems     Objective:   Physical Exam UPT negative, consent signed, Time out procedure done. Cervix prepped with betadine and grasped with a single tooth tenaculum. Christean Grief was easily placed and the strings were cut to 3-4 cm. Uterus sounded to 9 cm. She tolerated the procedure well.  Frothy discharge c/w recurrent BV     Assessment & Plan:  BV- hylafem and flagyl prn Contraception- Skyla. Rec back up for 2 weeks

## 2015-06-11 ENCOUNTER — Other Ambulatory Visit: Payer: Self-pay | Admitting: *Deleted

## 2015-06-11 DIAGNOSIS — F411 Generalized anxiety disorder: Secondary | ICD-10-CM

## 2015-06-11 MED ORDER — VENLAFAXINE HCL ER 37.5 MG PO CP24: 37.5000 mg | ORAL_CAPSULE | Freq: Every day | ORAL | Status: DC

## 2015-06-12 ENCOUNTER — Telehealth: Payer: Self-pay | Admitting: Family Medicine

## 2015-06-12 DIAGNOSIS — Z0000 Encounter for general adult medical examination without abnormal findings: Secondary | ICD-10-CM

## 2015-06-12 DIAGNOSIS — R5383 Other fatigue: Secondary | ICD-10-CM

## 2015-06-12 NOTE — Telephone Encounter (Signed)
cpe labs with complaint of fatigue.

## 2015-06-19 LAB — COMPLETE METABOLIC PANEL WITH GFR
ALBUMIN: 4.1 g/dL (ref 3.6–5.1)
ALT: 15 U/L (ref 6–29)
AST: 20 U/L (ref 10–30)
Alkaline Phosphatase: 47 U/L (ref 33–115)
BUN: 11 mg/dL (ref 7–25)
CHLORIDE: 105 mmol/L (ref 98–110)
CO2: 25 mmol/L (ref 20–31)
Calcium: 9.1 mg/dL (ref 8.6–10.2)
Creat: 0.72 mg/dL (ref 0.50–1.10)
GFR, Est African American: >89 mL/min (ref >=60)
GFR, Est Non African American: >89 mL/min (ref >=60)
GLUCOSE: 85 mg/dL (ref 65–99)
POTASSIUM: 3.9 mmol/L (ref 3.5–5.3)
SODIUM: 138 mmol/L (ref 135–146)
Total Bilirubin: 0.4 mg/dL (ref 0.2–1.2)
Total Protein: 7 g/dL (ref 6.1–8.1)

## 2015-06-19 LAB — LIPID PANEL
CHOL/HDL RATIO: 2.4 ratio (ref <=5.0)
Cholesterol: 173 mg/dL (ref 125–200)
HDL: 72 mg/dL (ref >=46)
LDL Cholesterol: 89 mg/dL (ref <130)
Triglycerides: 58 mg/dL (ref <150)
VLDL: 12 mg/dL (ref <30)

## 2015-06-19 LAB — CBC
HCT: 40.7 % (ref 36.0–46.0)
HEMOGLOBIN: 13.2 g/dL (ref 12.0–15.0)
MCH: 29.8 pg (ref 26.0–34.0)
MCHC: 32.4 g/dL (ref 30.0–36.0)
MCV: 91.9 fL (ref 78.0–100.0)
MPV: 11.4 fL (ref 8.6–12.4)
PLATELETS: 249 10*3/uL (ref 150–400)
RBC: 4.43 MIL/uL (ref 3.87–5.11)
RDW: 13.2 % (ref 11.5–15.5)
WBC: 7.3 10*3/uL (ref 4.0–10.5)

## 2015-06-19 LAB — VITAMIN D 25 HYDROXY (VIT D DEFICIENCY, FRACTURES): Vit D, 25-Hydroxy: 20 ng/mL — ABNORMAL LOW (ref 30–100)

## 2015-06-19 LAB — TSH: TSH: 1.741 u[IU]/mL (ref 0.350–4.500)

## 2015-06-19 LAB — VITAMIN B12: Vitamin B-12: 687 pg/mL (ref 211–911)

## 2015-06-20 ENCOUNTER — Telehealth: Payer: Self-pay | Admitting: Family Medicine

## 2015-06-20 ENCOUNTER — Other Ambulatory Visit: Payer: Self-pay | Admitting: *Deleted

## 2015-06-20 DIAGNOSIS — E559 Vitamin D deficiency, unspecified: Secondary | ICD-10-CM | POA: Insufficient documentation

## 2015-06-20 MED ORDER — VITAMIN D (ERGOCALCIFEROL) 1.25 MG (50000 UNIT) PO CAPS: 50000.0000 [IU] | ORAL_CAPSULE | ORAL | Status: DC

## 2015-06-20 NOTE — Telephone Encounter (Signed)
Victoria Barnes, Will you please let patient know that her blood work was normal other than a vitamin d deficiency.  I've sent a weekly supplement to her [facility] on file to help with this.

## 2015-06-20 NOTE — Telephone Encounter (Signed)
Pt.notified

## 2015-06-22 ENCOUNTER — Ambulatory Visit (INDEPENDENT_AMBULATORY_CARE_PROVIDER_SITE_OTHER): Payer: 59 | Admitting: Family Medicine

## 2015-06-22 ENCOUNTER — Encounter: Payer: Self-pay | Admitting: Family Medicine

## 2015-06-22 VITALS — BP 132/82 | HR 93 | Ht 59.0 in | Wt 118.0 lb

## 2015-06-22 DIAGNOSIS — Z0000 Encounter for general adult medical examination without abnormal findings: Secondary | ICD-10-CM

## 2015-06-22 MED ORDER — VENLAFAXINE HCL ER 75 MG PO CP24: 75.0000 mg | ORAL_CAPSULE | Freq: Every day | ORAL | Status: DC

## 2015-06-22 MED ORDER — TACROLIMUS 0.1 % EX OINT: TOPICAL_OINTMENT | Freq: Two times a day (BID) | CUTANEOUS | Status: DC

## 2015-06-22 NOTE — Progress Notes (Signed)
CC: Victoria Barnes is a 29 y.o. female is here for Annual Exam   Subjective: HPI:  Colonoscopy: Not indicated Papsmear: Normal 09/07/2014 Mammogram: No current indication  Influenza Vaccine: will receive this year Pneumovax: not indicated Td/Tdap: Up-to-date as of last year Zoster:No indicated yet  Requesting complete physical exam with complaints other than worsening eczema on arms not responsive to triamcinolone cream.  Feels like Effexor is becoming less effective  Review of Systems - General ROS: negative for - chills, fever, night sweats, weight gain or weight loss Ophthalmic ROS: negative for - decreased vision Psychological ROS: negative for - anxiety or depression ENT ROS: negative for - hearing change, nasal congestion, tinnitus or allergies Hematological and Lymphatic ROS: negative for - bleeding problems, bruising or swollen lymph nodes Breast ROS: negative Respiratory ROS: no cough, shortness of breath, or wheezing Cardiovascular ROS: no chest pain or dyspnea on exertion Gastrointestinal ROS: no abdominal pain, change in bowel habits, or black or bloody stools Genito-Urinary ROS: negative for - genital discharge, genital ulcers, incontinence or abnormal bleeding from genitals Musculoskeletal ROS: negative for - joint pain or muscle pain Neurological ROS: negative for - headaches or memory loss Dermatological ROS: negative for lumps, mole changes, rash and skin lesion changes other than that described above  Past Medical History  Diagnosis Date  . Eczema 2012    Past Surgical History  Procedure Laterality Date  . Wisdom tooth extraction    . Breast enhancement surgery Bilateral June 2015   Family History  Problem Relation Age of Onset  . Hyperlipidemia Mother     Social History   Social History  . Marital Status: Single    Spouse Name: N/A  . Number of Children: N/A  . Years of Education: N/A   Occupational History  . CMA    Social History Main Topics   . Smoking status: Current Some Day Smoker  . Smokeless tobacco: Not on file     Comment: Smoking 1 cig/month  . Alcohol Use: 2.4 oz/week    4 Standard drinks or equivalent per week  . Drug Use: No  . Sexual Activity:    Partners: Male    Birth Control/ Protection: Condom     Comment: condoms   Other Topics Concern  . Not on file   Social History Narrative     Objective: BP 132/82 mmHg  Pulse 93  Ht 4\' 11"  (1.499 m)  Wt 118 lb (53.524 kg)  BMI 23.82 kg/m2  LMP 05/23/2015  General: No Acute Distress HEENT: Atraumatic, normocephalic, conjunctivae normal without scleral icterus.  No nasal discharge, hearing grossly intact, TMs with good landmarks bilaterally with no middle ear abnormalities, posterior pharynx clear without oral lesions. Neck: Supple, trachea midline, no cervical nor supraclavicular adenopathy. Pulmonary: Clear to auscultation bilaterally without wheezing, rhonchi, nor rales. Cardiac: Regular rate and rhythm.  No murmurs, rubs, nor gallops. No peripheral edema.  2+ peripheral pulses bilaterally. Abdomen: Bowel sounds normal.  No masses.  Non-tender without rebound.  Negative Murphy's sign. GU: Declined MSK: Grossly intact, no signs of weakness.  Full strength throughout upper and lower extremities.  Full ROM in upper and lower extremities.  No midline spinal tenderness. Neuro: Gait unremarkable, CN II-XII grossly intact.  C5-C6 Reflex 2/4 Bilaterally, L4 Reflex 2/4 Bilaterally.  Cerebellar function intact. Skin: Mild to moderate eczematous changes ranging from 1 Center-2 cm diameter on the arms sparing the rest of the body. Psych: Alert and oriented to person/place/time.  Thought process normal. No anxiety/depression.  Assessment & Plan: [name] was seen today for annual exam.  Diagnoses and all orders for this visit:  Annual physical exam  Other orders -     venlafaxine XR (EFFEXOR XR) 75 MG 24 hr capsule; Take 1 capsule (75 mg total) by mouth daily with  breakfast. -     Discontinue: tacrolimus (PROTOPIC) 0.1 % ointment; Apply topically 2 (two) times daily. Only as needed to eczema spots. -     tacrolimus (PROTOPIC) 0.1 % ointment; Apply topically 2 (two) times daily. Only as needed to eczema spots.  Healthy lifestyle interventions including but not limited to regular exercise, a healthy low fat diet, moderation of salt intake, the dangers of tobacco/alcohol/recreational drug use, nutrition supplementation, and accident avoidance were discussed with the patient and a handout was provided for future reference. Increase in Effexor, start tacrolimus topical preparation in hopes of helping eczema.    Return in about 1 year (around 06/21/2016).

## 2015-07-17 ENCOUNTER — Encounter: Payer: Self-pay | Admitting: Family Medicine

## 2015-07-17 ENCOUNTER — Ambulatory Visit (INDEPENDENT_AMBULATORY_CARE_PROVIDER_SITE_OTHER): Payer: 59 | Admitting: Family Medicine

## 2015-07-17 VITALS — BP 121/83 | HR 93 | Wt 119.0 lb

## 2015-07-17 DIAGNOSIS — F32A Depression, unspecified: Secondary | ICD-10-CM

## 2015-07-17 DIAGNOSIS — F329 Major depressive disorder, single episode, unspecified: Secondary | ICD-10-CM

## 2015-07-17 DIAGNOSIS — L309 Dermatitis, unspecified: Secondary | ICD-10-CM | POA: Diagnosis not present

## 2015-07-17 MED ORDER — ESCITALOPRAM OXALATE 10 MG PO TABS: 10.0000 mg | ORAL_TABLET | Freq: Every day | ORAL | Status: DC

## 2015-07-17 NOTE — Progress Notes (Signed)
CC: Victoria Barnes is a 29 y.o. female is here for f/u mood   Subjective: HPI:   Follow-up depression: After increasing Effexor she did not notice any benefit with subjective depression. She's noticed that over the past couple weeks she's been much more disinterested in all hobbies, does not want to be active with loved ones, is becoming socially withdrawn and is having some irregular sleep habits. She's had a bleak outlook on life but denies any thoughts of wanting to harm self or others. Symptoms overall are mild to moderate in severity but getting in the way with her quality of life and relationships with others. Nothing seems to make symptoms better or worse. They're present all hours of the day. She denies any anxiety. No unintentional weight loss or gain or appetite change.  Follow-up eczema: She does be she thinks that the tacrolimus topical preparations are doing a great job at reducing her eczema flareups. She has a small lesion on the right distal forearm but has not applied any medication to get it was noticed first yesterday and it's itchy but not painful. Denies fevers, chills or swollen lymph nodes   Review Of Systems Outlined In HPI  Past Medical History  Diagnosis Date  . Eczema 2012    Past Surgical History  Procedure Laterality Date  . Wisdom tooth extraction    . Breast enhancement surgery Bilateral June 2015   Family History  Problem Relation Age of Onset  . Hyperlipidemia Mother     Social History   Social History  . Marital Status: Single    Spouse Name: N/A  . Number of Children: N/A  . Years of Education: N/A   Occupational History  . CMA    Social History Main Topics  . Smoking status: Current Some Day Smoker  . Smokeless tobacco: Not on file     Comment: Smoking 1 cig/month  . Alcohol Use: 2.4 oz/week    4 Standard drinks or equivalent per week  . Drug Use: No  . Sexual Activity:    Partners: Male    Birth Control/ Protection: Condom   Comment: condoms   Other Topics Concern  . Not on file   Social History Narrative     Objective: BP 121/83 mmHg  Pulse 93  Wt 119 lb (53.978 kg)  Vital signs reviewed. General: Alert and Oriented, No Acute Distress HEENT: Pupils equal, round, reactive to light. Conjunctivae clear.  External ears unremarkable.  Moist mucous membranes. Lungs: Clear and comfortable work of breathing, speaking in full sentences without accessory muscle use. Cardiac: Regular rate and rhythm.  Neuro: CN II-XII grossly intact, gait normal. Extremities: No peripheral edema.  Strong peripheral pulses.  Mental Status: mild depression. Noanxiety, nor agitation. Logical though process. Skin: Warm and dry. Trace eczematous changes of on the distal left forearm  Assessment & Plan: Victoria Barnes was seen today for f/u mood.  Diagnoses and all orders for this visit:  Depression -     escitalopram (LEXAPRO) 10 MG tablet; Take 1 tablet (10 mg total) by mouth daily.  Eczema   Depression: Uncontrolled chronic condition stopping Effexor and starting Lexapro. Will check with her on a weekly basis here in the clinic to see how she's doing Eczema: Overall improved, encouraged her to start using tacrolimus as soon as she starts seeing signs of eczema outbreaks.  Return if symptoms worsen or fail to improve.

## 2015-07-19 ENCOUNTER — Ambulatory Visit (INDEPENDENT_AMBULATORY_CARE_PROVIDER_SITE_OTHER): Payer: 59 | Admitting: Sports Medicine

## 2015-07-19 ENCOUNTER — Encounter: Payer: Self-pay | Admitting: Sports Medicine

## 2015-07-19 VITALS — BP 104/66 | HR 95 | Ht 59.0 in | Wt 121.0 lb

## 2015-07-19 DIAGNOSIS — R635 Abnormal weight gain: Secondary | ICD-10-CM | POA: Insufficient documentation

## 2015-07-19 MED ORDER — PHENTERMINE HCL 37.5 MG PO TABS
ORAL_TABLET | ORAL | Status: DC
Start: 2015-07-19 — End: 2015-08-27

## 2015-07-19 NOTE — Assessment & Plan Note (Signed)
Starting phentermine, return monthly for weight checks and refills. 

## 2015-07-19 NOTE — Progress Notes (Signed)
  Subjective:    CC: Abnormal weight gain  HPI: This is a pleasant 29 year old female, recently she was placed on Lexapro, unfortunately she has noted voracious appetite and great difficulty losing weight, she is very eager to lose weight but unfortunately dieting and exercise have been insufficient. She is eager to try pharmacological weight loss, with the understanding that this would take the commitments of decreased calorie intake and appropriate exercise.  Past medical history, Surgical history, Family history not pertinant except as noted below, Social history, Allergies, and medications have been entered into the medical record, reviewed, and no changes needed.   Review of Systems: No fevers, chills, night sweats, weight loss, chest pain, or shortness of breath.   Objective:    General: Well Developed, well nourished, and in no acute distress.  Neuro: Alert and oriented x3, extra-ocular muscles intact, sensation grossly intact.  HEENT: Normocephalic, atraumatic, pupils equal round reactive to light, neck supple, no masses, no lymphadenopathy, thyroid nonpalpable.  Skin: Warm and dry, no rashes. Cardiac: Regular rate and rhythm, no murmurs rubs or gallops, no lower extremity edema.  Respiratory: Clear to auscultation bilaterally. Not using accessory muscles, speaking in full sentences.  Impression and Recommendations:    I spent 25 minutes with this patient, greater than 50% was face-to-face time counseling regarding the above diagnoses

## 2015-08-02 ENCOUNTER — Encounter: Payer: Self-pay | Admitting: Family Medicine

## 2015-08-02 ENCOUNTER — Ambulatory Visit (INDEPENDENT_AMBULATORY_CARE_PROVIDER_SITE_OTHER): Payer: 59 | Admitting: Family Medicine

## 2015-08-02 VITALS — BP 115/80 | HR 91 | Ht 59.0 in | Wt 115.0 lb

## 2015-08-02 DIAGNOSIS — M25579 Pain in unspecified ankle and joints of unspecified foot: Secondary | ICD-10-CM | POA: Diagnosis not present

## 2015-08-02 NOTE — Progress Notes (Signed)
Orthotics Note:   Patient was fitted for a : standard, cushioned, semi-rigid orthotic. The orthotic was heated and afterward the patient stood on the orthotic blank positioned on the orthotic stand. The patient was positioned in subtalar neutral position and 10 degrees of ankle dorsiflexion in a weight bearing stance. After completion of molding, a stable base was applied to the orthotic blank. The blank was ground to a stable position for weight bearing. Size: 6 (cut to size 5) Base: White EVA Additional Posting and Padding: None The patient ambulated these, and they were very comfortable.  I spent 40 minutes with this patient, greater than 50% was face-to-face time counseling regarding the below diagnosis.

## 2015-08-08 ENCOUNTER — Telehealth: Payer: Self-pay | Admitting: Family Medicine

## 2015-08-08 MED ORDER — SULFAMETHOXAZOLE-TRIMETHOPRIM 800-160 MG PO TABS: 1.0000 | ORAL_TABLET | Freq: Two times a day (BID) | ORAL | Status: DC

## 2015-08-08 NOTE — Telephone Encounter (Signed)
Showed me a UA she ran on her urine today and it looks like she has a UTI, sent in Bactrim.

## 2015-08-23 ENCOUNTER — Encounter: Payer: Self-pay | Admitting: Family Medicine

## 2015-08-23 ENCOUNTER — Ambulatory Visit: Payer: 59 | Admitting: Physician Assistant

## 2015-08-23 ENCOUNTER — Ambulatory Visit (INDEPENDENT_AMBULATORY_CARE_PROVIDER_SITE_OTHER): Payer: 59 | Admitting: Family Medicine

## 2015-08-23 VITALS — BP 111/77 | HR 84 | Wt 114.0 lb

## 2015-08-23 DIAGNOSIS — F329 Major depressive disorder, single episode, unspecified: Secondary | ICD-10-CM | POA: Diagnosis not present

## 2015-08-23 DIAGNOSIS — F32A Depression, unspecified: Secondary | ICD-10-CM

## 2015-08-23 DIAGNOSIS — L309 Dermatitis, unspecified: Secondary | ICD-10-CM | POA: Diagnosis not present

## 2015-08-23 MED ORDER — VILAZODONE HCL 10 & 20 MG PO KIT: 1.0000 | PACK | Freq: Every day | ORAL | Status: DC

## 2015-08-23 NOTE — Progress Notes (Signed)
CC: Victoria Barnes is a 29 y.o. female is here for Depression   Subjective: HPI:  Irritability, excessive worrying, difficulty falling asleep, loss of interest in hobbies. Symptoms have been worsened by recent breakup with boyfriend, stress of moving, and restructuring work roles at work. Symptoms are moderate in severity and have not been helped by the new switch to Lexapro. Symptoms are present on a daily basis. Nothing seems to make better. Symptoms are present on the weekend as well. No thoughts when harm self or others. Mild anxiousness however overall she feels depressed more than anything. "I wish to live on [place] with electricity and food by myself". Denies hallucinations or paranoia.  She's happy with the response of her current topical therapy for eczema. She denies any new lesions.    Review Of Systems Outlined In HPI  Past Medical History  Diagnosis Date  . Eczema 2012    Past Surgical History  Procedure Laterality Date  . Wisdom tooth extraction    . Breast enhancement surgery Bilateral June 2015   Family History  Problem Relation Age of Onset  . Hyperlipidemia Mother     Social History   Social History  . Marital Status: Single    Spouse Name: N/A  . Number of Children: N/A  . Years of Education: N/A   Occupational History  . CMA    Social History Main Topics  . Smoking status: Current Some Day Smoker  . Smokeless tobacco: Not on file     Comment: Smoking 1 cig/month  . Alcohol Use: 2.4 oz/week    4 Standard drinks or equivalent per week  . Drug Use: No  . Sexual Activity:    Partners: Male    Birth Control/ Protection: Condom     Comment: condoms   Other Topics Concern  . Not on file   Social History Narrative     Objective: BP 111/77 mmHg  Pulse 84  Wt 114 lb (51.71 kg)  SpO2 99%  Vital signs reviewed. General: Alert and Oriented, No Acute Distress HEENT: Pupils equal, round, reactive to light. Conjunctivae clear.  External ears  unremarkable.  Moist mucous membranes. Lungs: Clear and comfortable work of breathing, speaking in full sentences without accessory muscle use. Cardiac: Regular rate and rhythm.  Neuro: CN II-XII grossly intact, gait normal. Extremities: No peripheral edema.  Strong peripheral pulses.  Mental Status: Mild depression no anxiety, nor agitation. Logical though process. Skin: Warm and dry.  Assessment & Plan: Alvena was seen today for depression.  Diagnoses and all orders for this visit:  Depression  Eczema  Other orders -     Vilazodone HCl (VIIBRYD STARTER PACK) 10 & 20 MG KIT; Take 1 tablet by mouth daily.   Depression: Uncontrolled chronic condition tapering off of Lexapro by taking 5 mg every day for the next 4 days, start Viibryd today. Savings voucher provided. Signs and symptoms requring emergent/urgent reevaluation were discussed with the patient. Eczema: Stable, no changes to medication regimen.  Return in about 4 weeks (around 09/20/2015).

## 2015-08-27 ENCOUNTER — Encounter: Payer: Self-pay | Admitting: Sports Medicine

## 2015-08-27 ENCOUNTER — Ambulatory Visit (INDEPENDENT_AMBULATORY_CARE_PROVIDER_SITE_OTHER): Payer: 59 | Admitting: Sports Medicine

## 2015-08-27 VITALS — BP 119/76 | HR 88 | Ht 59.0 in | Wt 115.0 lb

## 2015-08-27 DIAGNOSIS — R635 Abnormal weight gain: Secondary | ICD-10-CM

## 2015-08-27 MED ORDER — PHENTERMINE HCL 37.5 MG PO TABS: ORAL_TABLET | ORAL | Status: DC

## 2015-08-27 NOTE — Progress Notes (Signed)
  Subjective:    HK:[id number]:weight check  HPI: Abnormal weight gain: 6 pound weight loss in one month on phentermine. No side effects  Past medical history, Surgical history, Family history not pertinant except as noted below, Social history, Allergies, and medications have been entered into the medical record, reviewed, and no changes needed.   Review of Systems: No fevers, chills, night sweats, weight loss, chest pain, or shortness of breath.   Objective:    General: Well Developed, well nourished, and in no acute distress.  Neuro: Alert and oriented x3, extra-ocular muscles intact, sensation grossly intact.  HEENT: Normocephalic, atraumatic, pupils equal round reactive to light, neck supple, no masses, no lymphadenopathy, thyroid nonpalpable.  Skin: Warm and dry, no rashes. Cardiac: Regular rate and rhythm, no murmurs rubs or gallops, no lower extremity edema.  Respiratory: Clear to auscultation bilaterally. Not using accessory muscles, speaking in full sentences.  Impression and Recommendations:

## 2015-08-27 NOTE — Assessment & Plan Note (Signed)
Minimal initial weight loss. Refilling phentermine, return monthly for weight checks and refills.

## 2015-08-29 ENCOUNTER — Ambulatory Visit: Payer: 59 | Admitting: Sports Medicine

## 2015-09-07 ENCOUNTER — Other Ambulatory Visit: Payer: Self-pay | Admitting: Sports Medicine

## 2015-09-07 DIAGNOSIS — Z20818 Contact with and (suspected) exposure to other bacterial communicable diseases: Secondary | ICD-10-CM | POA: Insufficient documentation

## 2015-09-07 MED ORDER — FLUCONAZOLE 150 MG PO TABS: 150.0000 mg | ORAL_TABLET | Freq: Once | ORAL | Status: DC

## 2015-09-07 MED ORDER — AZITHROMYCIN 250 MG PO TABS: ORAL_TABLET | ORAL | Status: DC

## 2015-09-07 NOTE — Assessment & Plan Note (Signed)
Amoxicillin, Diflucan.

## 2015-09-13 ENCOUNTER — Ambulatory Visit (INDEPENDENT_AMBULATORY_CARE_PROVIDER_SITE_OTHER): Payer: 59 | Admitting: Family Medicine

## 2015-09-13 VITALS — BP 132/86 | HR 108 | Temp 98.2°F | Resp 18 | Wt 115.0 lb

## 2015-09-13 DIAGNOSIS — Z2089 Contact with and (suspected) exposure to other communicable diseases: Secondary | ICD-10-CM | POA: Diagnosis not present

## 2015-09-13 DIAGNOSIS — Z20818 Contact with and (suspected) exposure to other bacterial communicable diseases: Secondary | ICD-10-CM

## 2015-09-13 DIAGNOSIS — L02415 Cutaneous abscess of right lower limb: Secondary | ICD-10-CM | POA: Diagnosis not present

## 2015-09-13 DIAGNOSIS — A084 Viral intestinal infection, unspecified: Secondary | ICD-10-CM | POA: Diagnosis not present

## 2015-09-13 DIAGNOSIS — L0291 Cutaneous abscess, unspecified: Secondary | ICD-10-CM | POA: Insufficient documentation

## 2015-09-13 MED ORDER — ONDANSETRON HCL 4 MG PO TABS: 4.0000 mg | ORAL_TABLET | Freq: Three times a day (TID) | ORAL | Status: DC | PRN

## 2015-09-13 MED ORDER — FLUCONAZOLE 150 MG PO TABS: 150.0000 mg | ORAL_TABLET | Freq: Once | ORAL | Status: DC

## 2015-09-13 MED ORDER — DOXYCYCLINE HYCLATE 100 MG PO TABS
100.0000 mg | ORAL_TABLET | Freq: Two times a day (BID) | ORAL | Status: DC
Start: 2015-09-13 — End: 2015-11-06

## 2015-09-13 NOTE — Progress Notes (Signed)
Victoria Barnes is a 29 y.o. female who presents to [facility]  today for vomiting, and abscess.[name]  1) vomiting: Patient notes that she's been sick now for the last few days. She's had a cough congestion and runny nose body aches subjective fevers and chills. She's tried taking some over-the-counter medicines helped. She is getting better yesterday when she developed vomiting. She's had vomiting overnight. The last vomiting episode was this morning. She's able to drink ginger ale now without any further vomiting. She denies any chest pain or palpitations or shortness of breath. The cough continues. She notes the runny nose and sinus bradycardia and a pressure is improving. She has not tried any medications yet today.  2) abscess: Patient has a painful erythematous nodule on her right posterior ankle. This is been present for 3 days and is worsening. She's tried treating it with a combination of her eczema medications including clobetasol, triamcinolone, and tacrolimus cream. This has not helped.  Past Medical History  Diagnosis Date  . Eczema 2012   Past Surgical History  Procedure Laterality Date  . Wisdom tooth extraction    . Breast enhancement surgery Bilateral June 2015   Social History  Substance Use Topics  . Smoking status: Current Some Day Smoker  . Smokeless tobacco: Not on file     Comment: Smoking 1 cig/month  . Alcohol Use: 2.4 oz/week    4 Standard drinks or equivalent per week   family history includes Hyperlipidemia in her mother.  ROS as above Medications: Current Outpatient Prescriptions  Medication Sig Dispense Refill  . BIOTIN PO Take by mouth.    . Clobetasol Prop Emollient Base (CLOBETASOL PROPIONATE E) 0.05 % emollient cream Use twice a day only as needed for eczema. 30 g 1  . fexofenadine (ALLEGRA ALLERGY) 180 MG tablet Take 1 tablet (180 mg total) by mouth daily. 30 tablet 6  . Ginkgo Biloba 40 MG TABS Take by mouth.    .  Levonorgestrel (SKYLA) 13.5 MG IUD by Intrauterine route.    . Multiple Vitamin (MULTIVITAMIN) tablet Take 1 tablet by mouth daily.    . phentermine (ADIPEX-P) 37.5 MG tablet One tab by mouth qAM 30 tablet 0  . tacrolimus (PROTOPIC) 0.1 % ointment Apply topically 2 (two) times daily. Only as needed to eczema spots. 100 g 0  . Vitamin D, Ergocalciferol, (DRISDOL) 50000 UNITS CAPS capsule Take 1 capsule (50,000 Units total) by mouth every 7 (seven) days. Recheck Vitamin D in 3 Months 12 capsule 0  . doxycycline (VIBRA-TABS) 100 MG tablet Take 1 tablet (100 mg total) by mouth 2 (two) times daily. 14 tablet 0  . fluconazole (DIFLUCAN) 150 MG tablet Take 1 tablet (150 mg total) by mouth once. 2 tablet 0  . ondansetron (ZOFRAN) 4 MG tablet Take 1 tablet (4 mg total) by mouth every 8 (eight) hours as needed for nausea or vomiting. 20 tablet 0  . Vilazodone HCl (VIIBRYD) 20 MG TABS One by mouth daily. 30 tablet 4   No current facility-administered medications for this visit.   Allergies  Allergen Reactions  . Antacid [Alum & Mag Hydroxide-Simeth] Nausea And Vomiting  . Keflex [Cephalexin] Nausea And Vomiting  . Penicillins Nausea And Vomiting     Exam:  BP 132/86 mmHg  Pulse 108  Temp(Src) 98.2 F (36.8 C)  Resp 18  Wt 115 lb (52.164 kg)  SpO2 99%  Gen: Well NAD nontoxic appearing HEENT: EOMI,  MMM Lungs: Normal work of  breathing. CTABL Heart: Mild tachycardia regular rhythm no MRG Abd: NABS, Soft. Nondistended, Nontender Exts: Brisk capillary refill, warm and well perfused.  Skin: Erythematous injury to tender 1 cm nodule right posterior ankle. No fluctuance palpated. No surrounding or spreading erythema present.  Patient was given 1000 ml normal saline bolus, and 975 mg of oral Tylenol, and felt a lot better.   The IV was placed into the right AC using a 22-gauge catheter with 1 attempt.  No results found for this or any previous visit (from the past 24 hour(s)). No results  found.   Please see individual assessment and plan sections.

## 2015-09-13 NOTE — Assessment & Plan Note (Signed)
Not ready for drainage. Hopeful for improvement with doxycycline. Drain if worsening.

## 2015-09-13 NOTE — Assessment & Plan Note (Signed)
Improving. Better following IVF and tylenol. Rx zofran.  Return if not better.

## 2015-09-13 NOTE — Patient Instructions (Signed)
Thank you for coming in today. If your belly pain worsens, or you have high fever, bad vomiting, blood in your stool or black tarry stool go to the Emergency Room.   If the abscess becomes squishy in the middle we will drain it.  Hopefully the antibiotics will work.

## 2015-09-14 ENCOUNTER — Encounter: Payer: Self-pay | Admitting: Family Medicine

## 2015-09-17 ENCOUNTER — Telehealth: Payer: Self-pay | Admitting: Family Medicine

## 2015-09-17 DIAGNOSIS — F32A Depression, unspecified: Secondary | ICD-10-CM

## 2015-09-17 DIAGNOSIS — F329 Major depressive disorder, single episode, unspecified: Secondary | ICD-10-CM

## 2015-09-17 MED ORDER — VILAZODONE HCL 20 MG PO TABS: ORAL_TABLET | ORAL | Status: DC

## 2015-09-17 NOTE — Telephone Encounter (Signed)
Refill req 

## 2015-10-05 ENCOUNTER — Telehealth: Payer: Self-pay | Admitting: Family Medicine

## 2015-10-05 DIAGNOSIS — K141 Geographic tongue: Secondary | ICD-10-CM | POA: Insufficient documentation

## 2015-10-05 MED ORDER — FLUOCINONIDE 0.05 % EX GEL: 1.0000 "application " | Freq: Four times a day (QID) | CUTANEOUS | Status: DC

## 2015-10-05 NOTE — Telephone Encounter (Signed)
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

## 2015-10-08 ENCOUNTER — Telehealth: Payer: Self-pay

## 2015-10-08 DIAGNOSIS — Z20818 Contact with and (suspected) exposure to other bacterial communicable diseases: Secondary | ICD-10-CM

## 2015-10-08 MED ORDER — FLUCONAZOLE 150 MG PO TABS: 150.0000 mg | ORAL_TABLET | Freq: Once | ORAL | Status: DC

## 2015-10-08 MED ORDER — METRONIDAZOLE 0.75 % VA GEL: 1.0000 | Freq: Two times a day (BID) | VAGINAL | Status: DC

## 2015-10-09 NOTE — Telephone Encounter (Signed)
Opened in error. [facility],CMA  

## 2015-10-25 ENCOUNTER — Ambulatory Visit: Payer: 59 | Admitting: Obstetrics & Gynecology

## 2015-11-04 ENCOUNTER — Emergency Department
Admission: EM | Admit: 2015-11-04 | Discharge: 2015-11-04 | Disposition: A | Discharge status: Home / Self Care | Payer: 59 | Source: Home / Self Care | Attending: Family Medicine | Admitting: Family Medicine

## 2015-11-04 ENCOUNTER — Encounter: Payer: Self-pay | Admitting: Emergency Medicine

## 2015-11-04 DIAGNOSIS — H9201 Otalgia, right ear: Secondary | ICD-10-CM

## 2015-11-04 DIAGNOSIS — J029 Acute pharyngitis, unspecified: Secondary | ICD-10-CM | POA: Diagnosis not present

## 2015-11-04 LAB — POCT RAPID STREP A (OFFICE): Rapid Strep A Screen: NEGATIVE

## 2015-11-04 MED ORDER — MAGIC MOUTHWASH W/LIDOCAINE: 5.0000 mL | Freq: Three times a day (TID) | ORAL | Status: DC | PRN

## 2015-11-04 MED ORDER — IBUPROFEN 600 MG PO TABS: 600.0000 mg | ORAL_TABLET | Freq: Four times a day (QID) | ORAL | Status: DC | PRN

## 2015-11-04 NOTE — ED Notes (Signed)
Pt c/o right sided throat pain that started this am, right ear and jaw pain.

## 2015-11-04 NOTE — ED Provider Notes (Signed)
CSN: [id number]     Arrival date & time 11/04/15  1505 History   First MD Initiated Contact with Patient 11/04/15 1521     Chief Complaint  Patient presents with  . Otalgia  . Sore Throat   (Consider location/radiation/quality/duration/timing/severity/associated sxs/prior Treatment) HPI Pt is a 29[facility]/o Right sided throat pain that started suddenly this morning when she woke up.  Pain radiates into the Right side of her jaw and into her Right ear.  Pain is aching and sharp, worse with swallowing, waxes and wanes throughout the day.  Pain is 6/10 at worst. She has not tried anything for pain PTA. Denies fever, chills, n/v/d. Denies cough or congestion. 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  Denies dental pain.  Denies eating anything that may have gotten stuck in her throat last night.   Past Medical History  Diagnosis Date  . Eczema 2012   Past Surgical History  Procedure Laterality Date  . Wisdom tooth extraction    . Breast enhancement surgery Bilateral June 2015   Family History  Problem Relation Age of Onset  . Hyperlipidemia Mother    Social History  Substance Use Topics  . Smoking status: Current Some Day Smoker  . Smokeless tobacco: None     Comment: Smoking 1 cig/month  . Alcohol Use: 2.4 oz/week    4 Standard drinks or equivalent per week   OB History    Gravida Para Term Preterm AB TAB SAB Ectopic Multiple Living   3 1  1      1      Review of Systems  Constitutional: Negative for fever and chills.  HENT: Positive for ear pain (Right) and sore throat. Negative for congestion, trouble swallowing and voice change.   Respiratory: Negative for cough and shortness of breath.   Cardiovascular: Negative for chest pain and palpitations.  Gastrointestinal: Negative for nausea, vomiting, abdominal pain and diarrhea.  Musculoskeletal: Negative for myalgias, back pain and arthralgias.  Skin: Negative for rash.     Allergies  Antacid; Keflex; and Penicillins  Home Medications   Prior to Admission medications   Medication Sig Start Date End Date Taking? Authorizing Provider  BIOTIN PO Take by mouth.    Historical Provider, MD  Clobetasol Prop Emollient Base (CLOBETASOL PROPIONATE E) 0.05 % emollient cream Use twice a day only as needed for eczema. 05/18/15   Laren BoomSean Hommel, DO  doxycycline (VIBRA-TABS) 100 MG tablet Take 1 tablet (100 mg total) by mouth 2 (two) times daily. 09/13/15   Rodolph BongEvan S Corey, MD  fexofenadine [facility](ALLEGRA ALLERGY) 180 MG tablet Take 1 tablet (180 mg total) by mouth daily. 04/12/15   Sean Hommel, DO  fluconazole (DIFLUCAN) 150 MG tablet Take 1 tablet (150 mg total) by mouth once. 10/08/15   Sean Hommel, DO  fluocinonide gel (LIDEX) 0.05 % Apply 1 application topically 4 (four) times daily. Use for 1-2 weeks at a time. 10/05/15   Sean Hommel, DO  Ginkgo Biloba 40 MG TABS Take by mouth.    Historical Provider, MD  ibuprofen (ADVIL,MOTRIN) 600 MG tablet Take 1 tablet (600 mg total) by mouth every 6 (six) hours as needed. 11/04/15   Junius FinnerErin O'Malley, PA-C  Levonorgestrel (SKYLA) 13.5 MG IUD by Intrauterine route.    Historical Provider, MD  magic mouthwash w/lidocaine SOLN Take 5 mLs by mouth 3 (three) times daily as needed for mouth pain. Swish, gargle, and spit 11/04/15   Junius FinnerErin O'Malley,  PA-C  metroNIDAZOLE (METROGEL) 0.75 % vaginal gel Place 1 Applicatorful vaginally 2 (two) times daily. 10/08/15   Laren Boom, DO  Multiple Vitamin (MULTIVITAMIN) tablet Take 1 tablet by mouth daily.    Historical Provider, MD  ondansetron (ZOFRAN) 4 MG tablet Take 1 tablet (4 mg total) by mouth every 8 (eight) hours as needed for nausea or vomiting. 09/13/15   Rodolph Bong, MD  phentermine (ADIPEX-P) 37.5 MG tablet One tab by mouth qAM 08/27/15   Monica Becton, MD  tacrolimus (PROTOPIC) 0.1 % ointment Apply topically 2 (two) times daily. Only as needed to eczema spots. 06/22/15   Laren Boom, DO  Vilazodone  HCl (VIIBRYD) 20 MG TABS One by mouth daily. 09/17/15   Laren Boom, DO  Vitamin D, Ergocalciferol, (DRISDOL) 50000 UNITS CAPS capsule Take 1 capsule (50,000 Units total) by mouth every 7 (seven) days. Recheck Vitamin D in 3 Months 06/20/15   Laren Boom, DO   Meds Ordered and Administered this Visit  Medications - No data to display  BP 111/71 mmHg  Pulse 101  Temp(Src) 98 F (36.7 C) (Oral)  Ht 4\' 11"  (1.499 m)  Wt 119 lb (53.978 kg)  BMI 24.02 kg/m2  SpO2 100%  LMP 10/21/2015 No data found.   Physical Exam  Constitutional: She is oriented to person, place, and time. She appears well-developed and well-nourished.  HENT:  Head: Normocephalic and atraumatic.  Right Ear: Hearing, tympanic membrane, external ear and ear canal normal.  Left Ear: Hearing, tympanic membrane, external ear and ear canal normal.  Nose: Nose normal.  Mouth/Throat: Uvula is midline and mucous membranes are normal. Posterior oropharyngeal erythema present. No oropharyngeal exudate, posterior oropharyngeal edema or tonsillar abscesses.  Eyes: EOM are normal.  Neck: Normal range of motion. Neck supple.  Cardiovascular: Normal rate, regular rhythm and normal heart sounds.   Pulmonary/Chest: Effort normal. No stridor. No respiratory distress. She has no wheezes. She has no rales. She exhibits no tenderness.  Musculoskeletal: Normal range of motion.  Lymphadenopathy:    She has no cervical adenopathy.  Neurological: She is alert and oriented to person, place, and time.  Skin: Skin is warm and dry.  Psychiatric: She has a normal mood and affect. Her behavior is normal.  Nursing note and vitals reviewed.   ED Course  Procedures (including critical care time)  Labs Review Labs Reviewed  STREP A DNA PROBE  POCT RAPID STREP A (OFFICE)    Imaging Review No results found.   MDM   1. Sore throat   2. Right ear pain     Pt c/o Right sided throat pain that started this morning, radiating to Right ear.  Denies difficulty breathing. Pain worse with swallowing but she is able to keep down fluids.  She has not tried anything for symptoms yet. No evidence of tonsillar abscess  Rapid strep: negative Will send culture.   Rx: magic mouthwash with lidocaine.  Pt does have nausea and vomiting with antacid medication found in the magic mouthwash, however, pt instructed to swish, gargle, and spit.  This should hopefully provide some pain relief without systemic affects. Also encouraged to take acetaminophen and ibuprofen for pain. F/u with PCP in 4-5 days if not improving, sooner if worsening.  Patient verbalized understanding and agreement with treatment plan.    Junius Finner, PA-C 11/04/15 1630

## 2015-11-06 ENCOUNTER — Encounter: Payer: Self-pay | Admitting: Family Medicine

## 2015-11-06 ENCOUNTER — Ambulatory Visit (INDEPENDENT_AMBULATORY_CARE_PROVIDER_SITE_OTHER): Payer: 59 | Admitting: Family Medicine

## 2015-11-06 VITALS — BP 118/73 | HR 82 | Temp 98.5°F | Ht 59.0 in | Wt 118.0 lb

## 2015-11-06 DIAGNOSIS — J029 Acute pharyngitis, unspecified: Secondary | ICD-10-CM | POA: Diagnosis not present

## 2015-11-06 MED ORDER — PREDNISONE 10 MG PO TABS: 30.0000 mg | ORAL_TABLET | Freq: Every day | ORAL | Status: DC

## 2015-11-06 NOTE — Addendum Note (Signed)
Addended by: Collie SiadICHARDSON, Oaklen Thiam M on: 11/06/2015 05:01 PM   Modules accepted: Orders

## 2015-11-06 NOTE — Progress Notes (Signed)
Victoria Barnes is a 30 y.o. female who presents to [facility] today for sore throat. Patient had vomiting on New Year's Eve 3 days ago. The next morning she awoke with a right-sided sore throat. She is able to swallow without any issues and denies any current vomiting or blood in the vomit or stool. She was seen in urgent care the same day the pain occurred and a rapid strep test was negative. Throat culture is pending. She notes continued sore throat. She's using Magic mouthwash which helps some. Overall she notes the pain is moderate interfering with swallowing. She denies being acutely ill.   Past Medical History  Diagnosis Date  . Eczema 2012   Past Surgical History  Procedure Laterality Date  . Wisdom tooth extraction    . Breast enhancement surgery Bilateral June 2015   Social History  Substance Use Topics  . Smoking status: Current Some Day Smoker  . Smokeless tobacco: Not on file     Comment: Smoking 1 cig/month  . Alcohol Use: 2.4 oz/week    4 Standard drinks or equivalent per week   family history includes Hyperlipidemia in her mother.  ROS as above Medications: Current Outpatient Prescriptions  Medication Sig Dispense Refill  . BIOTIN PO Take by mouth.    . Clobetasol Prop Emollient Base (CLOBETASOL PROPIONATE E) 0.05 % emollient cream Use twice a day only as needed for eczema. 30 g 1  . fexofenadine (ALLEGRA ALLERGY) 180 MG tablet Take 1 tablet (180 mg total) by mouth daily. 30 tablet 6  . fluconazole (DIFLUCAN) 150 MG tablet Take 1 tablet (150 mg total) by mouth once. 2 tablet 4  . fluocinonide gel (LIDEX) 0.05 % Apply 1 application topically 4 (four) times daily. Use for 1-2 weeks at a time. 15 g 0  . Ginkgo Biloba 40 MG TABS Take by mouth.    [name] ibuprofen (ADVIL,MOTRIN) 600 MG tablet Take 1 tablet (600 mg total) by mouth every 6 (six) hours as needed. 30 tablet 0    . Levonorgestrel (SKYLA) 13.5 MG IUD by Intrauterine route.    . magic mouthwash w/lidocaine SOLN Take 5 mLs by mouth 3 (three) times daily as needed for mouth pain. Swish, gargle, and spit 45 mL 0  . metroNIDAZOLE (METROGEL) 0.75 % vaginal gel Place 1 Applicatorful vaginally 2 (two) times daily. 70 g 4  . Multiple Vitamin (MULTIVITAMIN) tablet Take 1 tablet by mouth daily.    . ondansetron (ZOFRAN) 4 MG tablet Take 1 tablet (4 mg total) by mouth every 8 (eight) hours as needed for nausea or vomiting. 20 tablet 0  . phentermine (ADIPEX-P) 37.5 MG tablet One tab by mouth qAM 30 tablet 0  . predniSONE (DELTASONE) 10 MG tablet Take 3 tablets (30 mg total) by mouth daily. 15 tablet 0  . tacrolimus (PROTOPIC) 0.1 % ointment Apply topically 2 (two) times daily. Only as needed to eczema spots. 100 g 0  . Vilazodone HCl (VIIBRYD) 20 MG TABS One by mouth daily. 30 tablet 4   No current facility-administered medications for this visit.   Allergies  Allergen Reactions  . Antacid [Alum & Mag Hydroxide-Simeth] Nausea And Vomiting  . Keflex [Cephalexin] Nausea And Vomiting  . Penicillins Nausea And Vomiting     Exam:  BP 118/73 mmHg  Pulse 82  Temp(Src) 98.5 F (36.9 C) (Oral)  Ht 4\' 11"  (1.499 m)  Wt 118 lb (53.524 kg)  BMI 23.82 kg/m2  LMP 10/21/2015 Gen: Well NAD HEENT: EOMI,  MMM posterior pharynx is largely normal appearing. The top of the epiglottis is visible but otherwise is unremarkable. No lesions or exudates. Normal tympanic membranes bilaterally. No significant cervical lymphadenopathy. No subcutaneous crepitations palpated. Normal neck motion. Lungs: Normal work of breathing. CTABL Heart: RRR no MRG Abd: NABS, Soft. Nondistended, Nontender Exts: Brisk capillary refill, warm and well perfused.   No results found for this or any previous visit (from the past 24 hour(s)). No results found.   Please see individual assessment and plan sections.

## 2015-11-06 NOTE — Assessment & Plan Note (Signed)
Likely irritation from vomiting. Very doubtful for Mallory-Weiss tear. Plan for watchful waiting. Awaiting for culture results. Treat with prednisone. Continue Tylenol or ibuprofen as needed.

## 2015-11-07 ENCOUNTER — Encounter: Payer: Self-pay | Admitting: Family Medicine

## 2015-11-07 ENCOUNTER — Ambulatory Visit (INDEPENDENT_AMBULATORY_CARE_PROVIDER_SITE_OTHER): Payer: 59 | Admitting: Family Medicine

## 2015-11-07 VITALS — BP 125/85 | HR 86 | Wt 119.0 lb

## 2015-11-07 DIAGNOSIS — J029 Acute pharyngitis, unspecified: Secondary | ICD-10-CM | POA: Diagnosis not present

## 2015-11-07 DIAGNOSIS — F329 Major depressive disorder, single episode, unspecified: Secondary | ICD-10-CM

## 2015-11-07 DIAGNOSIS — J020 Streptococcal pharyngitis: Secondary | ICD-10-CM

## 2015-11-07 DIAGNOSIS — F32A Depression, unspecified: Secondary | ICD-10-CM

## 2015-11-07 LAB — STREP A DNA PROBE: GASP: NOT DETECTED

## 2015-11-07 MED ORDER — VILAZODONE HCL 10 MG PO TABS
ORAL_TABLET | ORAL | Status: DC
Start: 2015-11-07 — End: 2015-11-07

## 2015-11-07 NOTE — Patient Instructions (Addendum)
Half tablet of the 20mg  viibryd

## 2015-11-07 NOTE — Progress Notes (Signed)
CC: Victoria Barnes is a 30 y.o. female is here for Medication Problem   Subjective: HPI:  Continued sore throat located on the right side of the neck and And worse with swallowing. No benefit from nonsteroidal anti-inflammatories. Has not started prednisone yet due to fears of side effects. Denies any difficulty swallowing, fever, cough, nor change in voice.  Her major complaint today is possible side effects of Viibryd. She looks back she's noticed that she's having more and more vivid dreams where she is now confusing dreams for what actually happened in reality. This is caused conflicts between her and her significant other. She is also having dreams that are much more frightening and she believes she is even having episodes of sleep paralysis where she is awake but cannot move her body. She denies any hallucinations that she is aware of. There's been no paranoia or depression. She is very happy with how to help him with depression but she cannot tolerate the above side effects.     Review Of Systems Outlined In HPI  Past Medical History  Diagnosis Date  . Eczema 2012    Past Surgical History  Procedure Laterality Date  . Wisdom tooth extraction    . Breast enhancement surgery Bilateral June 2015   Family History  Problem Relation Age of Onset  . Hyperlipidemia Mother     Social History   Social History  . Marital Status: Single    Spouse Name: N/A  . Number of Children: N/A  . Years of Education: N/A   Occupational History  . CMA    Social History Main Topics  . Smoking status: Current Some Day Smoker  . Smokeless tobacco: Not on file     Comment: Smoking 1 cig/month  . Alcohol Use: 2.4 oz/week    4 Standard drinks or equivalent per week  . Drug Use: No  . Sexual Activity:    Partners: Male    Birth Control/ Protection: Condom     Comment: condoms   Other Topics Concern  . Not on file   Social History Narrative     Objective: BP 125/85 mmHg  Pulse 86  Wt  119 lb (53.978 kg)  LMP 10/21/2015  Vital signs reviewed. General: Alert and Oriented, No Acute Distress HEENT: Pupils equal, round, reactive to light. Conjunctivae clear.  External ears unremarkable.  Moist mucous membranes. Pharynx unremarkable, uvula midline. Lungs: Clear and comfortable work of breathing, speaking in full sentences without accessory muscle use. Cardiac: Regular rate and rhythm.  Neuro: CN II-XII grossly intact, gait normal. Extremities: No peripheral edema.  Strong peripheral pulses.  Mental Status: No depression, mildly anxious. No agitation. Logical though process. Skin: Warm and dry.  Assessment & Plan: Keiona was seen today for medication problem.  Diagnoses and all orders for this visit:  Streptococcal sore throat  Sore throat -     POCT rapid strep A  Depression  Other orders -     Discontinue: Vilazodone HCl (VIIBRYD) 10 MG TABS; After finishing 20mg  fomulation start half tablet daily for five days.   Sore throat: Strep test is negative today and DNA probe from earlier this week is normal. Encouraged her to start on prednisone to help with pain. Depression: Overall controlled however side effects of Viibryd are now more troublesome than the benefit. Joint decision to cut back to 10 mg and will readdress this on Friday or Monday to determine if we want to keep cutting back down to 5 mg daily or  remain at 10 if she is not having side effects.  25 minutes spent face-to-face during visit today of which at least 50% was counseling or coordinating care regarding: 1. Streptococcal sore throat   2. Sore throat   3. Depression       Return if symptoms worsen or fail to improve.

## 2015-11-08 ENCOUNTER — Telehealth: Payer: Self-pay | Admitting: *Deleted

## 2015-11-08 ENCOUNTER — Ambulatory Visit: Payer: 59 | Admitting: Obstetrics & Gynecology

## 2015-11-21 ENCOUNTER — Ambulatory Visit: Payer: 59 | Admitting: Obstetrics & Gynecology

## 2015-11-22 ENCOUNTER — Encounter: Payer: Self-pay | Admitting: Obstetrics & Gynecology

## 2015-11-22 ENCOUNTER — Ambulatory Visit (INDEPENDENT_AMBULATORY_CARE_PROVIDER_SITE_OTHER): Payer: 59 | Admitting: Obstetrics & Gynecology

## 2015-11-22 VITALS — BP 127/75 | HR 108 | Resp 18 | Ht 59.0 in | Wt 119.0 lb

## 2015-11-22 DIAGNOSIS — N938 Other specified abnormal uterine and vaginal bleeding: Secondary | ICD-10-CM | POA: Diagnosis not present

## 2015-11-22 DIAGNOSIS — Z30011 Encounter for initial prescription of contraceptive pills: Secondary | ICD-10-CM

## 2015-11-22 DIAGNOSIS — Z30431 Encounter for routine checking of intrauterine contraceptive device: Secondary | ICD-10-CM

## 2015-11-22 DIAGNOSIS — N9489 Other specified conditions associated with female genital organs and menstrual cycle: Secondary | ICD-10-CM | POA: Diagnosis not present

## 2015-11-22 NOTE — Progress Notes (Signed)
   Subjective:    Patient ID: Victoria Barnes, female    DOB: November 29, 1985, 30 y.o.   MRN: [id number]  HPI  30 yo AA P28 (28 yo son) is here with the complaint of heavy irregular bleeding since she got her Skyla 8/16. She denies a change in sexual partner.  Review of Systems     Objective:   Physical Exam  [id number] Breathing, conversing, and ambulating normally Abd- benign NSSA, NT, mobile uterus and non-enlarged adnexa      Assessment & Plan:  DUB with skykla Check TSH, CBC I gave her a pack of Lo lo estrin to try to see if her bleeding decreases

## 2015-11-23 ENCOUNTER — Telehealth: Payer: Self-pay | Admitting: *Deleted

## 2015-11-23 LAB — CBC
HEMATOCRIT: 39.6 % (ref 36.0–46.0)
HEMOGLOBIN: 12.7 g/dL (ref 12.0–15.0)
MCH: 29.2 pg (ref 26.0–34.0)
MCHC: 32.1 g/dL (ref 30.0–36.0)
MCV: 91 fL (ref 78.0–100.0)
MPV: 11.7 fL (ref 8.6–12.4)
Platelets: 258 10*3/uL (ref 150–400)
RBC: 4.35 MIL/uL (ref 3.87–5.11)
RDW: 12.9 % (ref 11.5–15.5)
WBC: 8 10*3/uL (ref 4.0–10.5)

## 2015-11-23 LAB — TSH: TSH: 0.737 u[IU]/mL (ref 0.350–4.500)

## 2015-11-23 NOTE — Telephone Encounter (Signed)
Lm of normal labs on voicemail.

## 2015-11-29 ENCOUNTER — Telehealth: Payer: Self-pay | Admitting: Family Medicine

## 2015-11-29 DIAGNOSIS — R41840 Attention and concentration deficit: Secondary | ICD-10-CM

## 2015-11-29 NOTE — Telephone Encounter (Signed)
Pt requesting formal eval of poor concentration.

## 2015-12-19 ENCOUNTER — Telehealth: Payer: Self-pay | Admitting: Family Medicine

## 2015-12-19 MED ORDER — FLUOXETINE HCL 20 MG PO TABS: 20.0000 mg | ORAL_TABLET | Freq: Every day | ORAL | Status: DC

## 2015-12-19 MED ORDER — DULOXETINE HCL 20 MG PO CPEP: 20.0000 mg | ORAL_CAPSULE | Freq: Every day | ORAL | Status: DC

## 2015-12-19 MED FILL — FLUoxetine HCL 20 MG CAPS: 20 | 30 days supply | Qty: 30 | Fill #0

## 2015-12-19 NOTE — Addendum Note (Signed)
Addended by: Laren Boom on: 12/19/2015 04:57 PM   Modules accepted: Orders, Medications

## 2015-12-19 NOTE — Telephone Encounter (Addendum)
Since being off anti-depression meds for one month she has been having thoughts of   Quitting school, She is more annoyed with others, and is losing interest in hobbies. She and her boyfriend would like her to be back on a depression medication but not on any of the ones that she's tried already. Starting Prozac. Encourage her to reach out to me if it's not working in the next few days.

## 2015-12-21 ENCOUNTER — Encounter: Payer: Self-pay | Admitting: Osteopathic Medicine

## 2015-12-21 ENCOUNTER — Ambulatory Visit (INDEPENDENT_AMBULATORY_CARE_PROVIDER_SITE_OTHER): Payer: 59 | Admitting: Osteopathic Medicine

## 2015-12-21 VITALS — BP 125/79 | HR 84 | Ht 59.0 in | Wt 118.0 lb

## 2015-12-21 DIAGNOSIS — N898 Other specified noninflammatory disorders of vagina: Secondary | ICD-10-CM

## 2015-12-21 LAB — WET PREP FOR TRICH, YEAST, CLUE
Trich, Wet Prep: NONE SEEN
Yeast Wet Prep HPF POC: NONE SEEN

## 2015-12-21 MED ORDER — METRONIDAZOLE 500 MG PO TABS: 500.0000 mg | ORAL_TABLET | Freq: Two times a day (BID) | ORAL | Status: AC

## 2015-12-21 MED FILL — metroNIDAZOLE 500 MG TABS: 500 | 7 days supply | Qty: 14 | Fill #0

## 2015-12-21 MED FILL — FLUCONAZOLE 150 MG TABLET: 150 | 2 days supply | Qty: 2 | Fill #1

## 2015-12-21 NOTE — Addendum Note (Signed)
Addended by: Pixie Casino on: 12/21/2015 09:49 AM   Modules accepted: Orders

## 2015-12-21 NOTE — Addendum Note (Signed)
Addended by: Pixie Casino on: 12/21/2015 10:25 AM   Modules accepted: Orders

## 2015-12-21 NOTE — Progress Notes (Signed)
HPI: Victoria Barnes is a 30 y.o. female who presents to [name] today for chief complaint of:  Chief Complaint  Patient presents with  . Vaginitis     . Location: vaginal . Quality: mildly itchy discharge . Duration: few days . Context: seems like maybe BV, not bad yeast infection, has Diflucan Rx at home . Assoc signs/symptoms: no fever/chills, no pelvic pain, no vaginal sores/ulcer   Past medical, social and family history reviewed: Past Medical History  Diagnosis Date  . Eczema 2012   Past Surgical History  Procedure Laterality Date  . Wisdom tooth extraction    . Breast enhancement surgery Bilateral June 2015   Social History  Substance Use Topics  . Smoking status: Current Some Day Smoker  . Smokeless tobacco: Not on file     Comment: Smoking 1 cig/month  . Alcohol Use: 2.4 oz/week    4 Standard drinks or equivalent per week   Family History  Problem Relation Age of Onset  . Hyperlipidemia Mother     Current Outpatient Prescriptions  Medication Sig Dispense Refill  . BIOTIN PO Take by mouth.    . Clobetasol Prop Emollient Base (CLOBETASOL PROPIONATE E) 0.05 % emollient cream Use twice a day only as needed for eczema. 30 g 1  . fexofenadine (ALLEGRA ALLERGY) 180 MG tablet Take 1 tablet (180 mg total) by mouth daily. 30 tablet 6  . fluocinonide gel (LIDEX) 0.05 % Apply 1 application topically 4 (four) times daily. Use for 1-2 weeks at a time. 15 g 0  . FLUoxetine (PROZAC) 20 MG tablet Take 1 tablet (20 mg total) by mouth daily. 30 tablet 3  . Ginkgo Biloba 40 MG TABS Take by mouth.    . Levonorgestrel (SKYLA) 13.5 MG IUD by Intrauterine route.    . metroNIDAZOLE (METROGEL) 0.75 % vaginal gel Place 1 Applicatorful vaginally 2 (two) times daily. 70 g 4  . tacrolimus (PROTOPIC) 0.1 % ointment Apply topically 2 (two) times daily. Only as needed to eczema spots. 100 g 0  . metroNIDAZOLE (FLAGYL) 500 MG tablet Take 1 tablet (500 mg  total) by mouth 2 (two) times daily. 14 tablet 0   No current facility-administered medications for this visit.   Allergies  Allergen Reactions  . Antacid [Alum & Mag Hydroxide-Simeth] Nausea And Vomiting  . Keflex [Cephalexin] Nausea And Vomiting  . Penicillins Nausea And Vomiting  . Viibryd [Vilazodone Hcl]     Abnormal Frightening Dreams      Review of Systems: CONSTITUTIONAL:  No  fever, no chills, No  unintentional weight changes GASTROINTESTINAL: No  nausea, No  vomiting, No  abdominal pain GENITOURINARY: No  incontinence, No  abnormal genital bleeding/ulcer, (+) discharge as per HPI SKIN: No  rash/wounds/concerning lesions   Exam:  BP 125/79 mmHg  Pulse 84  Ht  (1.499 m)  Wt 118 lb (53.524 kg)  BMI 23.82 kg/m2 Constitutional: VS see above. General Appearance: alert, well-developed, well-nourished, NAD Psychiatric: Normal judgment/insight. Normal mood and affect.     No results found for this or any previous visit (from the past 72 hour(s)).    ASSESSMENT/PLAN: Labs as below. IUD in place. Pt request CG/CT screen, decline HIV  Vaginal discharge - Plan: WET PREP FOR TRICH, YEAST, CLUE, metroNIDAZOLE (FLAGYL) 500 MG tablet, GC/chlamydia probe amp, urine   Return if symptoms worsen or fail to improve.

## 2015-12-21 NOTE — Addendum Note (Signed)
Addended by: Pixie Casino on: 12/21/2015 09:52 AM   Modules accepted: Orders

## 2015-12-22 LAB — GC/CHLAMYDIA PROBE AMP
CT PROBE, AMP APTIMA: NOT DETECTED
GC PROBE AMP APTIMA: NOT DETECTED

## 2016-01-07 DIAGNOSIS — F429 Obsessive-compulsive disorder, unspecified: Secondary | ICD-10-CM | POA: Diagnosis not present

## 2016-01-07 DIAGNOSIS — F4010 Social phobia, unspecified: Secondary | ICD-10-CM | POA: Diagnosis not present

## 2016-01-07 DIAGNOSIS — R41840 Attention and concentration deficit: Secondary | ICD-10-CM | POA: Diagnosis not present

## 2016-01-07 DIAGNOSIS — H93299 Other abnormal auditory perceptions, unspecified ear: Secondary | ICD-10-CM | POA: Diagnosis not present

## 2016-01-07 DIAGNOSIS — F902 Attention-deficit hyperactivity disorder, combined type: Secondary | ICD-10-CM | POA: Diagnosis not present

## 2016-01-07 DIAGNOSIS — F338 Other recurrent depressive disorders: Secondary | ICD-10-CM | POA: Diagnosis not present

## 2016-01-07 DIAGNOSIS — F1920 Other psychoactive substance dependence, uncomplicated: Secondary | ICD-10-CM | POA: Diagnosis not present

## 2016-01-07 DIAGNOSIS — Z79899 Other long term (current) drug therapy: Secondary | ICD-10-CM | POA: Diagnosis not present

## 2016-01-07 DIAGNOSIS — F419 Anxiety disorder, unspecified: Secondary | ICD-10-CM | POA: Diagnosis not present

## 2016-01-08 ENCOUNTER — Encounter: Payer: Self-pay | Admitting: Family Medicine

## 2016-01-08 ENCOUNTER — Ambulatory Visit (INDEPENDENT_AMBULATORY_CARE_PROVIDER_SITE_OTHER): Payer: 59 | Admitting: Family Medicine

## 2016-01-08 VITALS — BP 127/90 | Wt 116.0 lb

## 2016-01-08 DIAGNOSIS — L309 Dermatitis, unspecified: Secondary | ICD-10-CM | POA: Diagnosis not present

## 2016-01-08 DIAGNOSIS — F329 Major depressive disorder, single episode, unspecified: Secondary | ICD-10-CM

## 2016-01-08 DIAGNOSIS — F909 Attention-deficit hyperactivity disorder, unspecified type: Secondary | ICD-10-CM | POA: Diagnosis not present

## 2016-01-08 DIAGNOSIS — F32A Depression, unspecified: Secondary | ICD-10-CM

## 2016-01-08 MED ORDER — FLUOXETINE HCL 20 MG PO TABS: 20.0000 mg | ORAL_TABLET | Freq: Every day | ORAL | Status: DC

## 2016-01-08 NOTE — Progress Notes (Signed)
CC: Victoria Barnes is a 30 y.o. female is here for No chief complaint on file.   Subjective: HPI:  Recently diagnosed with Adhd by Dr. Elisabeth MostStevenson,  She started on Adderall this morning and has had no known side effects. She tells me that she feels different but she has difficulty identifying exactly how, she denies any discomfort or displeasure. Denies any known side effects. No anxiety or appetite suppression.  Since switching to Prozac she's no longer having any more vivid dreams and states that she has no degree of anxiety or depression.  Follow-up eczema: she has an outbreak on the palm her left hand. It's nontender. She just began to use Protopic and believes it starting to help. She denies any skin lesions elsewhere.   Review Of Systems Outlined In HPI  Past Medical History  Diagnosis Date  . Eczema 2012    Past Surgical History  Procedure Laterality Date  . Wisdom tooth extraction    . Breast enhancement surgery Bilateral June 2015   Family History  Problem Relation Age of Onset  . Hyperlipidemia Mother     Social History   Social History  . Marital Status: Single    Spouse Name: N/A  . Number of Children: N/A  . Years of Education: N/A   Occupational History  . CMA    Social History Main Topics  . Smoking status: Current Some Day Smoker  . Smokeless tobacco: Not on file     Comment: Smoking 1 cig/month  . Alcohol Use: 2.4 oz/week    4 Standard drinks or equivalent per week  . Drug Use: No  . Sexual Activity:    Partners: Male    Birth Control/ Protection: Condom     Comment: condoms   Other Topics Concern  . Not on file   Social History Narrative     Objective: BP 127/90 mmHg  Wt 116 lb (52.617 kg)  Vital signs reviewed. General: Alert and Oriented, No Acute Distress HEENT: Pupils equal, round, reactive to light. Conjunctivae clear.  External ears unremarkable.  Moist mucous membranes. Lungs: Clear and comfortable work of breathing, speaking in  full sentences without accessory muscle use. Cardiac: Regular rate and rhythm.  Neuro: CN II-XII grossly intact, gait normal. Extremities: No peripheral edema.  Strong peripheral pulses.  Mental Status: No depression, anxiety, nor agitation. Logical though process. Skin: Warm and dry. Assessment & Plan: Diagnoses and all orders for this visit:  Depression -     FLUoxetine (PROZAC) 20 MG tablet; Take 1 tablet (20 mg total) by mouth daily.  Attention deficit hyperactivity disorder (ADHD), unspecified ADHD type  Eczema   Depression controlled with Prozac. ADHD: Plan to continue on daily Adderall and follow with Dr. Carmela HurtStephenson in one month to recheck her Qb test. Eczema: Controlled with Protopic   Return in about 4 weeks (around 02/05/2016) for ADHD f/u.\

## 2016-01-09 ENCOUNTER — Ambulatory Visit (INDEPENDENT_AMBULATORY_CARE_PROVIDER_SITE_OTHER): Payer: 59 | Admitting: Sports Medicine

## 2016-01-09 DIAGNOSIS — A084 Viral intestinal infection, unspecified: Secondary | ICD-10-CM | POA: Diagnosis not present

## 2016-01-09 NOTE — Progress Notes (Signed)
  Subjective:    CC: Dehydration  HPI: Diarrhea for several days, difficulty keeping up with oral intake. Desires parenteral hydration. No constitutional symptoms. No abdominal pain. No hematochezia, melena, or hematemesis.  Past medical history, Surgical history, Family history not pertinant except as noted below, Social history, Allergies, and medications have been entered into the medical record, reviewed, and no changes needed.   Review of Systems: No fevers, chills, night sweats, weight loss, chest pain, or shortness of breath.   Objective:    General: Well Developed, well nourished, and in no acute distress.  Neuro: Alert and oriented x3, extra-ocular muscles intact, sensation grossly intact.  HEENT: Normocephalic, atraumatic, pupils equal round reactive to light, neck supple, no masses, no lymphadenopathy, thyroid nonpalpable.  Skin: Warm and dry, no rashes. Cardiac: Regular rate and rhythm, no murmurs rubs or gallops, no lower extremity edema.  Respiratory: Clear to auscultation bilaterally. Not using accessory muscles, speaking in full sentences. Abdomen: Soft, nontender, nondistended, normal bowel sounds, no palpable masses, no guarding, rigidity, rebound tenderness.  20-gauge angiocatheter placed in the right cubital vein, and 2 L normal saline run rapidly.  Impression and Recommendations:    I spent 40 minutes with this patient, greater than 50% was face-to-face time counseling regarding the above diagnoses

## 2016-01-09 NOTE — Assessment & Plan Note (Signed)
With dehydration, 2 L IV fluids given today. Return as needed.

## 2016-01-10 ENCOUNTER — Ambulatory Visit (INDEPENDENT_AMBULATORY_CARE_PROVIDER_SITE_OTHER): Payer: 59 | Admitting: Family Medicine

## 2016-01-10 ENCOUNTER — Encounter: Payer: Self-pay | Admitting: Family Medicine

## 2016-01-10 VITALS — BP 122/76 | HR 101 | Wt 116.0 lb

## 2016-01-10 DIAGNOSIS — R197 Diarrhea, unspecified: Secondary | ICD-10-CM

## 2016-01-10 DIAGNOSIS — F900 Attention-deficit hyperactivity disorder, predominantly inattentive type: Secondary | ICD-10-CM | POA: Diagnosis not present

## 2016-01-10 MED ORDER — LISDEXAMFETAMINE DIMESYLATE 30 MG PO CAPS: 30.0000 mg | ORAL_CAPSULE | Freq: Every day | ORAL | Status: DC

## 2016-01-10 MED FILL — FLUoxetine HCL 20 MG CAPS: 20 | 90 days supply | Qty: 90 | Fill #0

## 2016-01-10 MED FILL — VYVANSE 30 MG CAPSULE: 30 | 30 days supply | Qty: 30 | Fill #0

## 2016-01-10 NOTE — Progress Notes (Signed)
CC: Victoria Barnes is a 30 y.o. female is here for Medication Management   Subjective: HPI:  She's been taking Adderall XR daily ever since Tuesday. She's noticed that this medication causes her to feel incredibly drowsy and tired within a few minutes after taking it. It seems to wear off sometime around the afternoon. She's also been experiencing some diarrhea however this started on Monday the day before she started taking Adderall XR. She doesn't feel like the medication is helping much with her distractibility and inattentiveness. She denies any other side effects. She denies any fevers, chills, abdominal pain or blood in her stool. As of this morning she's not had any bowel movement or fecal urgency.   Review Of Systems Outlined In HPI  Past Medical History  Diagnosis Date  . Eczema 2012    Past Surgical History  Procedure Laterality Date  . Wisdom tooth extraction    . Breast enhancement surgery Bilateral June 2015   Family History  Problem Relation Age of Onset  . Hyperlipidemia Mother     Social History   Social History  . Marital Status: Single    Spouse Name: N/A  . Number of Children: N/A  . Years of Education: N/A   Occupational History  . CMA    Social History Main Topics  . Smoking status: Current Some Day Smoker  . Smokeless tobacco: Not on file     Comment: Smoking 1 cig/month  . Alcohol Use: 2.4 oz/week    4 Standard drinks or equivalent per week  . Drug Use: No  . Sexual Activity:    Partners: Male    Birth Control/ Protection: Condom     Comment: condoms   Other Topics Concern  . Not on file   Social History Narrative     Objective: BP 122/76 mmHg  Pulse 101  Wt 116 lb (52.617 kg)  Vital signs reviewed. General: Alert and Oriented, No Acute Distress HEENT: Pupils equal, round, reactive to light. Conjunctivae clear.  External ears unremarkable.  Moist mucous membranes. Lungs: Clear and comfortable work of breathing, speaking in full  sentences without accessory muscle use. Cardiac: Regular rate and rhythm.  Neuro: CN II-XII grossly intact, gait normal. Extremities: No peripheral edema.  Strong peripheral pulses.  Mental Status: No depression, anxiety, nor agitation. Logical though process. Skin: Warm and dry.  Assessment & Plan: [name] was seen today for medication management.  Diagnoses and all orders for this visit:  Diarrhea, unspecified type  Attention deficit hyperactivity disorder (ADHD), predominantly inattentive type -     lisdexamfetamine (VYVANSE) 30 MG capsule; Take 1 capsule (30 mg total) by mouth daily.   Diarrhea: Appears to be improving, no signs of infectious etiology.  ADHD: Unfavorable side effect of Adderall therefore switching to Vyvanse, she should still follow up with Dr. Lucia EstelleStephenson's office next month for repeat testing.    Return if symptoms worsen or fail to improve.

## 2016-01-14 ENCOUNTER — Telehealth: Payer: Self-pay | Admitting: *Deleted

## 2016-01-14 MED ORDER — AMPHETAMINE-DEXTROAMPHETAMINE 10 MG PO TABS: ORAL_TABLET | ORAL | Status: DC

## 2016-01-14 MED FILL — AMPHETAMINE SALTS 10 MG TAB: 10 | 30 days supply | Qty: 30 | Fill #0

## 2016-01-14 NOTE — Telephone Encounter (Signed)
[place], Rx placed in in-box ready for pickup/faxing.  

## 2016-01-14 NOTE — Telephone Encounter (Signed)
Pt states the vyvanse wears off in 9 hours. Would it be possible to get an immediate release to carry over throught out the day?

## 2016-01-14 NOTE — Telephone Encounter (Signed)
Pt.notified

## 2016-01-22 MED FILL — SF 5000 PLUS CREAM: 1.1 | 30 days supply | Qty: 51 | Fill #0

## 2016-02-06 ENCOUNTER — Ambulatory Visit: Payer: 59 | Admitting: Family Medicine

## 2016-02-06 DIAGNOSIS — Z79899 Other long term (current) drug therapy: Secondary | ICD-10-CM | POA: Diagnosis not present

## 2016-02-06 DIAGNOSIS — F419 Anxiety disorder, unspecified: Secondary | ICD-10-CM | POA: Diagnosis not present

## 2016-02-06 DIAGNOSIS — F902 Attention-deficit hyperactivity disorder, combined type: Secondary | ICD-10-CM | POA: Diagnosis not present

## 2016-02-06 DIAGNOSIS — F1920 Other psychoactive substance dependence, uncomplicated: Secondary | ICD-10-CM | POA: Diagnosis not present

## 2016-02-08 ENCOUNTER — Other Ambulatory Visit: Payer: Self-pay | Admitting: Family Medicine

## 2016-02-08 MED ORDER — CLINDAMYCIN PHOS-BENZOYL PEROX 1-5 % EX GEL: Freq: Two times a day (BID) | CUTANEOUS | Status: DC

## 2016-02-08 MED FILL — CLINDAMYCIN-BENZOYL PEROX 1: 1-5 | 25 days supply | Qty: 25 | Fill #0

## 2016-02-08 MED FILL — VYVANSE 50 MG CAPSULE: 50 | 30 days supply | Qty: 30 | Fill #0

## 2016-02-11 ENCOUNTER — Other Ambulatory Visit: Payer: Self-pay

## 2016-02-11 ENCOUNTER — Telehealth: Payer: Self-pay | Admitting: Family Medicine

## 2016-02-11 MED ORDER — METRONIDAZOLE 0.75 % VA GEL: 1.0000 | Freq: Two times a day (BID) | VAGINAL | Status: DC

## 2016-02-11 MED ORDER — FEXOFENADINE HCL 180 MG PO TABS: 180.0000 mg | ORAL_TABLET | Freq: Every day | ORAL | Status: DC

## 2016-02-11 MED FILL — metroNIDAZOLE 0.75 % GEL: 0.75 | 5 days supply | Qty: 70 | Fill #0

## 2016-02-11 NOTE — Telephone Encounter (Signed)
Patient request a 90 day supply of Allegra 180 mg sent to [facility]. Rhonda Cunningham,CMA

## 2016-02-11 NOTE — Telephone Encounter (Signed)
Med refill

## 2016-02-13 MED FILL — FEXOFENADINE HCL 180 MG TAB: 180 | 30 days supply | Qty: 30 | Fill #0

## 2016-02-26 ENCOUNTER — Encounter: Payer: Self-pay | Admitting: Sports Medicine

## 2016-02-26 ENCOUNTER — Ambulatory Visit (INDEPENDENT_AMBULATORY_CARE_PROVIDER_SITE_OTHER): Payer: 59 | Admitting: Sports Medicine

## 2016-02-26 VITALS — BP 113/75 | HR 76 | Wt 114.0 lb

## 2016-02-26 DIAGNOSIS — G43909 Migraine, unspecified, not intractable, without status migrainosus: Secondary | ICD-10-CM | POA: Diagnosis not present

## 2016-02-26 HISTORY — DX: Migraine, unspecified, not intractable, without status migrainosus: G43.909

## 2016-02-26 NOTE — Progress Notes (Addendum)
  Subjective:    CC: Headache  HPI: This is a pleasant 30 year old female, she is one of our medical assistants, she comes in with a two-day history of worsening headache, right-sided temporal, throbbing, with photophobia, phonophobia, no nausea and no preceding aura. No other constitutional symptoms, she gets these occasionally on her cycles. She is having difficulty taking down oral fluids and is desiring parenteral fluids and treatment today. Symptoms are moderate, persistent.  Past medical history, Surgical history, Family history not pertinant except as noted below, Social history, Allergies, and medications have been entered into the medical record, reviewed, and no changes needed.   Review of Systems: No fevers, chills, night sweats, weight loss, chest pain, or shortness of breath.   Objective:    General: Well Developed, well nourished, and in no acute distress.  Neuro: Alert and oriented x3, extra-ocular muscles intact, sensation grossly intact.  HEENT: Normocephalic, atraumatic, pupils equal round reactive to light, neck supple, no masses, no lymphadenopathy, thyroid nonpalpable.  Skin: Warm and dry, no rashes. Cardiac: Regular rate and rhythm, no murmurs rubs or gallops, no lower extremity edema.  Respiratory: Clear to auscultation bilaterally. Not using accessory muscles, speaking in full sentences.  I placed a 20-gauge angiocatheter in the right cubital vein, we infused 2 L of normal saline, as well as slow IV push of 30 mg of Toradol and 4 mg of dexamethasone IV. All IV pushes were done by me.  Impression and Recommendations:    I spent 40 minutes with this patient, greater than 50% was face-to-face time counseling regarding the above diagnoses

## 2016-02-26 NOTE — Assessment & Plan Note (Addendum)
Catamenial migraine, 2 L normal saline, Toradol 30, Decadron 4 infused slowly. Patient was feeling better at the end of the treatment. Return as needed.

## 2016-02-28 MED FILL — VYVANSE 70 MG CAPSULE: 70 | 30 days supply | Qty: 30 | Fill #0

## 2016-02-29 MED FILL — SF 5000 PLUS CREAM: 1.1 | 30 days supply | Qty: 51 | Fill #1

## 2016-03-11 DIAGNOSIS — F419 Anxiety disorder, unspecified: Secondary | ICD-10-CM | POA: Diagnosis not present

## 2016-03-11 DIAGNOSIS — Z79899 Other long term (current) drug therapy: Secondary | ICD-10-CM | POA: Diagnosis not present

## 2016-03-11 DIAGNOSIS — F902 Attention-deficit hyperactivity disorder, combined type: Secondary | ICD-10-CM | POA: Diagnosis not present

## 2016-03-11 DIAGNOSIS — F1920 Other psychoactive substance dependence, uncomplicated: Secondary | ICD-10-CM | POA: Diagnosis not present

## 2016-03-17 ENCOUNTER — Encounter: Payer: Self-pay | Admitting: Family Medicine

## 2016-03-17 ENCOUNTER — Ambulatory Visit (INDEPENDENT_AMBULATORY_CARE_PROVIDER_SITE_OTHER): Payer: 59 | Admitting: Family Medicine

## 2016-03-17 VITALS — BP 126/88 | HR 90 | Wt 114.0 lb

## 2016-03-17 DIAGNOSIS — T148 Other injury of unspecified body region: Secondary | ICD-10-CM | POA: Diagnosis not present

## 2016-03-17 DIAGNOSIS — W5503XA Scratched by cat, initial encounter: Secondary | ICD-10-CM | POA: Diagnosis not present

## 2016-03-17 DIAGNOSIS — F329 Major depressive disorder, single episode, unspecified: Secondary | ICD-10-CM

## 2016-03-17 DIAGNOSIS — F32A Depression, unspecified: Secondary | ICD-10-CM

## 2016-03-17 MED ORDER — AZITHROMYCIN 250 MG PO TABS: ORAL_TABLET | ORAL | Status: AC

## 2016-03-17 MED ORDER — FLUCONAZOLE 150 MG PO TABS: ORAL_TABLET | ORAL | Status: DC

## 2016-03-17 MED ORDER — FLUOXETINE HCL 20 MG PO TABS: 40.0000 mg | ORAL_TABLET | Freq: Every day | ORAL | Status: DC

## 2016-03-17 NOTE — Progress Notes (Signed)
CC: Victoria Barnes is a 30 y.o. female is here for cat scratch and Anxiety   Subjective: HPI:  This last Saturday she was scratched on both forearms by her cat. It's getting more and more painful on a daily basis. She's been keeping it clean however it continues to get more and more red. It's painful to touch and also with rest. She denies any skin lesions elsewhere left arm hurts greater than right. She denies any fevers, chills or nausea.  Follow depression: She tells me that her anxiety and depression is getting somewhat worse. This happened soon after the recent passing of a loved one who was in her 4220s. Patient is now frequently thinking about doing the family or which friend might die next. She is not fearful that she might die. She denies any other anxiousness or signs of depression. No unintentional weight loss or gain or appetite change. No thoughts or or others   Review Of Systems Outlined In HPI  Past Medical History  Diagnosis Date  . Eczema 2012    Past Surgical History  Procedure Laterality Date  . Wisdom tooth extraction    . Breast enhancement surgery Bilateral June 2015   Family History  Problem Relation Age of Onset  . Hyperlipidemia Mother     Social History   Social History  . Marital Status: Single    Spouse Name: N/A  . Number of Children: N/A  . Years of Education: N/A   Occupational History  . CMA    Social History Main Topics  . Smoking status: Current Some Day Smoker  . Smokeless tobacco: Not on file     Comment: Smoking 1 cig/month  . Alcohol Use: 2.4 oz/week    4 Standard drinks or equivalent per week  . Drug Use: No  . Sexual Activity:    Partners: Male    Birth Control/ Protection: Condom     Comment: condoms   Other Topics Concern  . Not on file   Social History Narrative     Objective: BP 126/88 mmHg  Pulse 90  Wt 114 lb (51.71 kg)  Vital signs reviewed. General: Alert and Oriented, No Acute Distress HEENT: Pupils equal,  round, reactive to light. Conjunctivae clear.  External ears unremarkable.  Moist mucous membranes. Lungs: Clear and comfortable work of breathing, speaking in full sentences without accessory muscle use. Cardiac: Regular rate and rhythm.  Neuro: CN II-XII grossly intact, gait normal. Extremities: No peripheral edema.  Strong peripheral pulses.  Mental Status: No depression, anxiety, nor agitation. Logical though process. Skin: Warm and dry. Slight erythema surrounding the left greater than right cat scratch.     Assessment & Plan: Victoria Barnes was seen today for cat scratch and anxiety.  Diagnoses and all orders for this visit:  Cat scratch -     azithromycin (ZITHROMAX) 250 MG tablet; Take two tabs at once on day 1, then one tab daily on days 2-5.  Depression -     Discontinue: FLUoxetine (PROZAC) 20 MG tablet; Take 2 tablets (40 mg total) by mouth daily. -     FLUoxetine (PROZAC) 20 MG tablet; Take 2 tablets (40 mg total) by mouth daily.  Other orders -     fluconazole (DIFLUCAN) 150 MG tablet; Take one tab, may take second tab if no improvement after 72 hours.  Cat scratch is questionably infected, the looks of it actually sounds better than the progressive pain aspect therefore starting azithromycin just to be safe. Depression: Controlled  chronic condition increasing Prozac. She requests a prescription for Diflucan given pass his infections with antibiotics.  Return if symptoms worsen or fail to improve.

## 2016-03-28 ENCOUNTER — Telehealth: Payer: Self-pay | Admitting: Family Medicine

## 2016-03-28 DIAGNOSIS — F900 Attention-deficit hyperactivity disorder, predominantly inattentive type: Secondary | ICD-10-CM

## 2016-03-28 MED ORDER — LISDEXAMFETAMINE DIMESYLATE 70 MG PO CAPS: 70.0000 mg | ORAL_CAPSULE | Freq: Every day | ORAL | Status: DC

## 2016-03-28 MED FILL — SF 5000 PLUS CREAM: 1.1 | 30 days supply | Qty: 51 | Fill #2

## 2016-03-28 MED FILL — metroNIDAZOLE 0.75 % GEL: 0.75 | 5 days supply | Qty: 70 | Fill #1

## 2016-03-28 NOTE — Telephone Encounter (Signed)
Refill req [facility] reviewed

## 2016-04-03 MED FILL — FLUoxetine HCL 20 MG CAPS: 20 | 30 days supply | Qty: 60 | Fill #0

## 2016-04-14 ENCOUNTER — Encounter: Payer: Self-pay | Admitting: Family Medicine

## 2016-04-14 ENCOUNTER — Ambulatory Visit (INDEPENDENT_AMBULATORY_CARE_PROVIDER_SITE_OTHER): Payer: 59 | Admitting: Family Medicine

## 2016-04-14 VITALS — BP 120/75 | HR 105 | Temp 97.9°F | Wt 113.0 lb

## 2016-04-14 DIAGNOSIS — R238 Other skin changes: Secondary | ICD-10-CM

## 2016-04-14 NOTE — Progress Notes (Signed)
Subjective:    CC: Rash  HPI:  Comes in to discuss a rash. She says it comes and goes. Typically it will start with a shooting type pain from the mid forearm up to her wrist and then will be followed by a red streak over the skin. And then usually by the next day she'll actually notice a few vesicles over the thenar eminence. It can happen on either hand. Always recurs in the same location. She can go weeks to months between outbreaks. She typically will use her topical steroids that she does have a history of eczema and it does seem to go away after about 3 days of use of topical treatment.  Past medical history, Surgical history, Family history not pertinant except as noted below, Social history, Allergies, and medications have been entered into the medical record, reviewed, and corrections made.    Objective:    General: Well Developed, well nourished, and in no acute distress.  Neuro: Alert and oriented x3, HEENT: Normocephalic, atraumatic  Skin: Warm and dry. She has 2 vesicles on the thenar eminence on the palm of the left hand. One looks more vesicular and one looks more pustular. I swabbed both lesions for a herpes simplex viral culture. Respiratory:  Not using accessory muscles, speaking in full sentences.   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 She has some features of both. The pain and streaking is unusual before the outbreak. Viral culture obtained. Will call with results. Can continue to use topical steroids since it does seem to improve her symptoms.

## 2016-04-16 LAB — HERPES SIMPLEX VIRUS CULTURE: Organism ID, Bacteria: DETECTED

## 2016-04-17 ENCOUNTER — Ambulatory Visit: Payer: 59 | Admitting: Family Medicine

## 2016-04-30 MED FILL — metroNIDAZOLE 0.75 % GEL: 0.75 | 5 days supply | Qty: 70 | Fill #2

## 2016-04-30 MED FILL — CLINDAMYCIN-BENZOYL PEROX 1: 1-5 | 25 days supply | Qty: 25 | Fill #1

## 2016-04-30 MED FILL — SF 5000 PLUS CREAM: 1.1 | 30 days supply | Qty: 51 | Fill #3

## 2016-04-30 MED FILL — FLUCONAZOLE 150 MG TABLET: 150 | 2 days supply | Qty: 2 | Fill #2

## 2016-04-30 MED FILL — SM FEXOFENADINE HCL 180 MG: 180 MG | 30 days supply | Qty: 30 | Fill #1

## 2016-05-01 ENCOUNTER — Other Ambulatory Visit: Payer: Self-pay

## 2016-05-01 DIAGNOSIS — F900 Attention-deficit hyperactivity disorder, predominantly inattentive type: Secondary | ICD-10-CM

## 2016-05-01 MED ORDER — LISDEXAMFETAMINE DIMESYLATE 70 MG PO CAPS: 70.0000 mg | ORAL_CAPSULE | Freq: Every day | ORAL | Status: DC

## 2016-05-01 MED FILL — VYVANSE 70 MG CAPSULE: 70 | 30 days supply | Qty: 30 | Fill #0

## 2016-05-05 ENCOUNTER — Encounter: Payer: Self-pay | Admitting: Family Medicine

## 2016-05-05 ENCOUNTER — Ambulatory Visit (INDEPENDENT_AMBULATORY_CARE_PROVIDER_SITE_OTHER): Payer: 59 | Admitting: Family Medicine

## 2016-05-05 VITALS — BP 127/80 | HR 83 | Wt 115.0 lb

## 2016-05-05 DIAGNOSIS — F418 Other specified anxiety disorders: Secondary | ICD-10-CM

## 2016-05-05 MED ORDER — CLONAZEPAM 0.5 MG PO TABS: 0.5000 mg | ORAL_TABLET | Freq: Two times a day (BID) | ORAL | Status: DC | PRN
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
## 2016-05-08 ENCOUNTER — Telehealth: Payer: Self-pay | Admitting: Family Medicine

## 2016-05-08 DIAGNOSIS — F329 Major depressive disorder, single episode, unspecified: Secondary | ICD-10-CM

## 2016-05-08 DIAGNOSIS — F32A Depression, unspecified: Secondary | ICD-10-CM

## 2016-05-08 MED ORDER — FLUOXETINE HCL 20 MG PO CAPS: 60.0000 mg | ORAL_CAPSULE | Freq: Every day | ORAL | Status: DC

## 2016-05-08 MED FILL — FLUoxetine HCL 20 MG CAPS: 20 | 90 days supply | Qty: 90 | Fill #0

## 2016-05-08 NOTE — Telephone Encounter (Signed)
Patient comes in today feeling like she is still very depressed. She's been on 40 mg of fluoxetine for several months. She says she has no thoughts of wanting to harm herself but just feels lifeless. She is open to seeing a therapist/counselor. Will go ahead and place referral. Increase fluoxetine to 60 mg. Follow-up with PCP in 2-3 weeks.  Nani Gasseratherine Metheney, MD

## 2016-05-15 ENCOUNTER — Ambulatory Visit (INDEPENDENT_AMBULATORY_CARE_PROVIDER_SITE_OTHER): Payer: 59 | Admitting: Licensed Clinical Social Worker

## 2016-05-15 DIAGNOSIS — F321 Major depressive disorder, single episode, moderate: Secondary | ICD-10-CM

## 2016-05-22 ENCOUNTER — Encounter: Payer: Self-pay | Admitting: Physician Assistant

## 2016-05-22 ENCOUNTER — Ambulatory Visit (INDEPENDENT_AMBULATORY_CARE_PROVIDER_SITE_OTHER): Payer: 59 | Admitting: Licensed Clinical Social Worker

## 2016-05-22 ENCOUNTER — Ambulatory Visit (INDEPENDENT_AMBULATORY_CARE_PROVIDER_SITE_OTHER): Payer: 59 | Admitting: Physician Assistant

## 2016-05-22 VITALS — BP 124/69 | HR 114 | Ht 59.0 in | Wt 113.0 lb

## 2016-05-22 DIAGNOSIS — F900 Attention-deficit hyperactivity disorder, predominantly inattentive type: Secondary | ICD-10-CM

## 2016-05-22 DIAGNOSIS — F329 Major depressive disorder, single episode, unspecified: Secondary | ICD-10-CM

## 2016-05-22 DIAGNOSIS — G43001 Migraine without aura, not intractable, with status migrainosus: Secondary | ICD-10-CM

## 2016-05-22 DIAGNOSIS — F321 Major depressive disorder, single episode, moderate: Secondary | ICD-10-CM | POA: Diagnosis not present

## 2016-05-22 DIAGNOSIS — F32A Depression, unspecified: Secondary | ICD-10-CM

## 2016-05-22 DIAGNOSIS — G4700 Insomnia, unspecified: Secondary | ICD-10-CM

## 2016-05-22 MED ORDER — KETOROLAC TROMETHAMINE 30 MG/ML IJ SOLN
30.0000 mg | Freq: Once | INTRAMUSCULAR | Status: AC
  Administered 2016-05-22: 30 mg via INTRAMUSCULAR

## 2016-05-22 MED ORDER — TRAZODONE HCL 50 MG PO TABS: 25.0000 mg | ORAL_TABLET | Freq: Every evening | ORAL | Status: DC | PRN

## 2016-05-22 MED ORDER — LISDEXAMFETAMINE DIMESYLATE 70 MG PO CAPS: 70.0000 mg | ORAL_CAPSULE | Freq: Every day | ORAL | Status: DC

## 2016-05-22 MED FILL — traZODone HCL 50 MG TABS: 50 | 30 days supply | Qty: 30 | Fill #0
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
## 2016-05-23 ENCOUNTER — Encounter: Payer: Self-pay | Admitting: Physician Assistant

## 2016-05-23 ENCOUNTER — Ambulatory Visit (INDEPENDENT_AMBULATORY_CARE_PROVIDER_SITE_OTHER): Payer: 59 | Admitting: Physician Assistant

## 2016-05-23 VITALS — BP 118/79 | HR 110

## 2016-05-23 DIAGNOSIS — E860 Dehydration: Secondary | ICD-10-CM | POA: Diagnosis not present

## 2016-05-23 DIAGNOSIS — G43101 Migraine with aura, not intractable, with status migrainosus: Secondary | ICD-10-CM

## 2016-05-23 DIAGNOSIS — R110 Nausea: Secondary | ICD-10-CM | POA: Diagnosis not present

## 2016-05-23 MED ORDER — DEXAMETHASONE SODIUM PHOSPHATE 4 MG/ML IJ SOLN
8.0000 mg | Freq: Once | INTRAMUSCULAR | Status: AC
  Administered 2016-05-23: 8 mg via INTRAVENOUS

## 2016-05-23 MED ORDER — PROMETHAZINE HCL 25 MG/ML IJ SOLN
25.0000 mg | Freq: Once | INTRAMUSCULAR | Status: AC
  Administered 2016-05-23: 25 mg via INTRAVENOUS

## 2016-05-23 MED ORDER — KETOROLAC TROMETHAMINE 60 MG/2ML IM SOLN
60.0000 mg | Freq: Once | INTRAMUSCULAR | Status: AC
  Administered 2016-05-23: 60 mg via INTRAMUSCULAR

## 2016-05-23 MED ORDER — RIZATRIPTAN BENZOATE 10 MG PO TABS: 10.0000 mg | ORAL_TABLET | ORAL | Status: DC | PRN

## 2016-05-23 MED FILL — RIZATRIPTAN 10 MG TABLET: 10 | 30 days supply | Qty: 9 | Fill #0

## 2016-05-23 NOTE — Progress Notes (Signed)
   Subjective:    I'm seeing this patient as a consultation for:  Tandy GawJade Breeback, PA-C  CC: Venous access  HPI: This is a pleasant 30 year old female, she sounds to be having a migraine, primary treating provider feels as though IV fluids are necessary so I'm called for further evaluation and definitive treatment with regards to establishing intravenous access.  Past medical history, Surgical history, Family history not pertinant except as noted below, Social history, Allergies, and medications have been entered into the medical record, reviewed, and no changes needed.   Review of Systems: No headache, visual changes, nausea, vomiting, diarrhea, constipation, dizziness, abdominal pain, skin rash, fevers, chills, night sweats, weight loss, swollen lymph nodes, body aches, joint swelling, muscle aches, chest pain, shortness of breath, mood changes, visual or auditory hallucinations.   Objective:   General: Well Developed, well nourished, and in no acute distress.  Neuro/Psych: Alert and oriented x3, extra-ocular muscles intact, able to move all 4 extremities, sensation grossly intact. Skin: Warm and dry, no rashes noted.  Respiratory: Not using accessory muscles, speaking in full sentences, trachea midline.  Cardiovascular: Pulses palpable, no extremity edema. Abdomen: Does not appear distended.  Intravenous access into the left cubital vein was performed with a 20-gauge angiocatheter.  We then infused 1 L of normal saline, and did slow IV pushes of 60 mg of Toradol, 8 mg of Decadron, and 12.5 mg of Phenergan.  Impression and Recommendations:   This case required medical decision making of moderate complexity.  1. Dehydration/migraine: Angiocatheter placement into the cubital vein, further fluid management per primary treating provider. ___________________________________________ Ihor Austinhomas J. Benjamin Stainhekkekandam, M.D., ABFM., CAQSM. Primary Care and Sports Medicine  [facility]  Adjunct Instructor of Family Medicine  [employer]
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
## 2016-05-23 NOTE — Patient Instructions (Signed)
If you start to have regular migraines need to consider preventative.

## 2016-05-24 DIAGNOSIS — R110 Nausea: Secondary | ICD-10-CM | POA: Insufficient documentation

## 2016-05-26 ENCOUNTER — Other Ambulatory Visit: Payer: Self-pay | Admitting: Physician Assistant

## 2016-05-26 DIAGNOSIS — B009 Herpesviral infection, unspecified: Secondary | ICD-10-CM

## 2016-05-26 MED ORDER — MELOXICAM 15 MG PO TABS: 15.0000 mg | ORAL_TABLET | Freq: Every day | ORAL | 1 refills | Status: DC

## 2016-05-26 MED ORDER — VALACYCLOVIR HCL 1 G PO TABS: ORAL_TABLET | ORAL | 0 refills | Status: DC

## 2016-05-26 MED FILL — MELOXICAM 15 MG TABLET: 15 | 30 days supply | Qty: 30 | Fill #0

## 2016-05-26 MED FILL — valACYclovir HCL 1 GM TABS: 1 | 2 days supply | Qty: 8 | Fill #0 | Status: TO

## 2016-05-26 NOTE — Progress Notes (Signed)
Pt calls and lets me know that she has vesicles and bilateral arm pain that occasionally occurs. 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 She had first outbreak when she was 30 years old. This is her 56 outbreak.   Will send valtrex and mobic.

## 2016-05-30 ENCOUNTER — Other Ambulatory Visit: Payer: Self-pay | Admitting: Physician Assistant

## 2016-05-30 ENCOUNTER — Telehealth: Payer: Self-pay | Admitting: *Deleted

## 2016-05-30 DIAGNOSIS — R238 Other skin changes: Secondary | ICD-10-CM

## 2016-05-30 NOTE — Telephone Encounter (Signed)
Labs ordered.

## 2016-06-02 LAB — HSV(HERPES SMPLX)ABS-I+II(IGG+IGM)-BLD
HERPES SIMPLEX VRS I-IGM AB (EIA): 0.53 {index}
HSV 1 Glycoprotein G Ab, IgG: <0.9 Index (ref <0.90)
HSV 2 GLYCOPROTEIN G AB, IGG: 14.6 {index} — AB (ref <0.90)

## 2016-06-04 ENCOUNTER — Encounter: Payer: Self-pay | Admitting: Physician Assistant

## 2016-06-05 ENCOUNTER — Ambulatory Visit (INDEPENDENT_AMBULATORY_CARE_PROVIDER_SITE_OTHER): Payer: 59 | Admitting: Licensed Clinical Social Worker

## 2016-06-05 DIAGNOSIS — F321 Major depressive disorder, single episode, moderate: Secondary | ICD-10-CM | POA: Diagnosis not present

## 2016-06-05 LAB — VIRAL CULTURE VIRC

## 2016-06-05 LAB — CYTOMEGALOVIRUS CULTURE

## 2016-06-05 MED FILL — VYVANSE 70 MG CAPSULE: 70 | 30 days supply | Qty: 30 | Fill #0

## 2016-06-06 LAB — RFX HSV/VARICELLA ZOSTER RAPID CULT

## 2016-06-06 LAB — REFLEX ADENOVIRUS CULTURE

## 2016-06-11 LAB — HSV 1/2 AB (IGM), IFA W/RFLX TITER
HSV 1 IgM Screen: NEGATIVE
HSV 2 IgM Screen: NEGATIVE

## 2016-06-13 ENCOUNTER — Other Ambulatory Visit: Payer: Self-pay | Admitting: Family Medicine

## 2016-06-13 MED ORDER — FLUCONAZOLE 150 MG PO TABS: 150.0000 mg | ORAL_TABLET | Freq: Once | ORAL | 1 refills | Status: AC

## 2016-06-13 MED ORDER — DOXYCYCLINE HYCLATE 100 MG PO TABS: 100.0000 mg | ORAL_TABLET | Freq: Two times a day (BID) | ORAL | 0 refills | Status: DC

## 2016-06-13 NOTE — Progress Notes (Signed)
Right 3rd digit paronychia. Plan to treat with antibiotics. Fluconazole for use if yeast infection.

## 2016-06-17 ENCOUNTER — Encounter: Payer: Self-pay | Admitting: Physician Assistant

## 2016-06-17 ENCOUNTER — Ambulatory Visit (INDEPENDENT_AMBULATORY_CARE_PROVIDER_SITE_OTHER): Payer: 59 | Admitting: Physician Assistant

## 2016-06-17 VITALS — BP 108/75 | HR 76 | Wt 113.0 lb

## 2016-06-17 DIAGNOSIS — F4323 Adjustment disorder with mixed anxiety and depressed mood: Secondary | ICD-10-CM

## 2016-06-17 MED ORDER — VORTIOXETINE HBR 10 MG PO TABS: 1.0000 | ORAL_TABLET | Freq: Every day | ORAL | 0 refills | Status: DC

## 2016-06-17 MED FILL — TRINTELLIX 10 MG TABLET: 10 | 30 days supply | Qty: 30 | Fill #0

## 2016-06-17 NOTE — Patient Instructions (Signed)
Decrease to 40mg  of prozac for 1 week with 1/2 tablet for trintellix then decrease to 20mg  for 4 days of prozac same dose of trintellix then stop prozac and increase to 10mg (full tablet) for prozac.

## 2016-06-17 NOTE — Progress Notes (Signed)
   Subjective:    Patient ID: Victoria Barnes, female    DOB: Feb 14, 1986, 30 y.o.   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 She is a single mother who is in college to get her bachelors degree. Over the past few months she has experienced worsening depression and anxiety. Seems like an assalt on her friend started these feelings. Since she feels like her life has "fallen apart". She recently was told by someone she loved and trusted that he did not want to be with her. She lives with him and having to find another house to live in. 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 She is really upset with the level of hate in our country. She feels sad her son has to grow up in these times. She almost feels like she is becoming racist against white people. She has no desire to do anything. She does not want to work, eat or sleep. She denies any harm to her son. She denies any suicidal thoughts. She does wish "she would just die" so she would be out of this torment. She is crying most of the day. She denies any alcohol or drug use.    Review of Systems  All other systems reviewed and are negative.      Objective:   Physical Exam  Constitutional: She appears well-nourished.  Psychiatric:  Tearful throughout exam, shaking at points.  When stopped crying flat affect.           Assessment & Plan:  Acute depression/anxiety- will taper off prozac and start trintellix. Continue clonazepam as needed. Trazodone up to 100mg  for sleep.  Written out of work for 2 weeks. Continue with Raynelle FanningJulie and counseling. Discussed cutting back school hours. Follow up in 2 weeks.    Spent 30 minutes with patient and greater than 50 percent of visit spent counseling pt regarding mood.

## 2016-06-20 ENCOUNTER — Other Ambulatory Visit: Payer: Self-pay | Admitting: Physician Assistant

## 2016-06-20 DIAGNOSIS — F4323 Adjustment disorder with mixed anxiety and depressed mood: Secondary | ICD-10-CM

## 2016-06-20 NOTE — Progress Notes (Signed)
Placed referral to get Pt into the partial hospitalization [facility]. Called and left VM with office to schedule Pt.

## 2016-06-23 ENCOUNTER — Encounter: Payer: 59 | Admitting: Family Medicine

## 2016-06-23 ENCOUNTER — Other Ambulatory Visit (HOSPITAL_COMMUNITY): Payer: 59 | Attending: Psychiatry | Admitting: Licensed Clinical Social Worker

## 2016-06-23 ENCOUNTER — Other Ambulatory Visit (HOSPITAL_COMMUNITY): Payer: 59

## 2016-06-23 DIAGNOSIS — F322 Major depressive disorder, single episode, severe without psychotic features: Secondary | ICD-10-CM

## 2016-06-23 DIAGNOSIS — F909 Attention-deficit hyperactivity disorder, unspecified type: Secondary | ICD-10-CM

## 2016-06-23 DIAGNOSIS — F332 Major depressive disorder, recurrent severe without psychotic features: Secondary | ICD-10-CM | POA: Insufficient documentation

## 2016-06-23 DIAGNOSIS — F17200 Nicotine dependence, unspecified, uncomplicated: Secondary | ICD-10-CM | POA: Insufficient documentation

## 2016-06-24 ENCOUNTER — Other Ambulatory Visit (HOSPITAL_COMMUNITY): Payer: 59 | Admitting: Psychiatry

## 2016-06-24 ENCOUNTER — Ambulatory Visit: Payer: 59 | Admitting: Licensed Clinical Social Worker

## 2016-06-24 ENCOUNTER — Encounter (HOSPITAL_COMMUNITY): Payer: Self-pay | Admitting: Licensed Clinical Social Worker

## 2016-06-24 DIAGNOSIS — F332 Major depressive disorder, recurrent severe without psychotic features: Secondary | ICD-10-CM | POA: Diagnosis not present

## 2016-06-24 DIAGNOSIS — F17200 Nicotine dependence, unspecified, uncomplicated: Secondary | ICD-10-CM | POA: Diagnosis not present

## 2016-06-24 NOTE — Psych (Signed)
   [facility]  Victoria Barnes [id number]  Session Time: 9 AM - 2 PM  Participation Level: Active  Behavioral Response: CasualAlertDepressed  Type of Therapy: Individual Therapy  Treatment Goals addressed: Coping  Interventions: CBT, DBT, Supportive and Reframing  Summary: Clinician facilitated check-in regarding current stressors and situation, and review of patient completed diary card. Clinician utilized active listening and empathetic response and validated patient emotions. Clinician utilized a Needs Assessment Handout to discover the current needs of the patient. Clinician had patient to address needs that needed improvement in her life. Clinician helped patient to discuss how to address these needs in the future. Clinician talked with the patient about healthy and unhealthy relationships, and how the relationships were affecting her life. Clinician also helped patient to process activities to do after group, instead of going home and ruminating about her problems.  Patient was seen by the doctor to address current functioning, and possible medication management.  Clinician assessed for immediate needs, medication compliance and efficacy, and safety concerns.    Suicidal/Homicidal: Nowithout intent/plan  Therapist Response: Victoria Barnes is a 30 y.o. female who presents with depressive symptoms. Patient presented within time allowed and in a conflicted mood. Patient participated in discussion, and is conflicted about relationship and social issues.  Patient engaged in activity and discussion. Patient shared that she often has a bad attitude towards her friends, and that she wants to learn how to be a better friend. Patient shared that she did not want to be held back by harmful relationships, and that she wanted to move forward and accomplish her goals. Patient shared plans of buying her own house, and developing healthier relationships.  Patient  demonstrates  progress as evidenced by her willingness to attend group, and being engaged in active discussion. Patient is also becoming aware of unhealthy relationships in her life. Patient denies SI/HI/self-harm thoughts, endorses medication compliance and continued efficacy, and denies any immediate needs.    Plan: Patient will continue in PHP and medication management. Work towards decreasing depression symptoms and increase emotional regulation and positive coping skills.    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 Severe single current episode of major depressive disorder, without psychotic features (HCC)      Donia GuilesJenny Kenric Ginger, LCSW 06/24/2016
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
## 2016-06-25 ENCOUNTER — Other Ambulatory Visit: Payer: Self-pay | Admitting: Physician Assistant

## 2016-06-25 ENCOUNTER — Other Ambulatory Visit (HOSPITAL_COMMUNITY): Payer: 59 | Admitting: Licensed Clinical Social Worker

## 2016-06-25 DIAGNOSIS — F332 Major depressive disorder, recurrent severe without psychotic features: Secondary | ICD-10-CM | POA: Diagnosis not present

## 2016-06-25 DIAGNOSIS — F17200 Nicotine dependence, unspecified, uncomplicated: Secondary | ICD-10-CM | POA: Diagnosis not present

## 2016-06-25 DIAGNOSIS — F900 Attention-deficit hyperactivity disorder, predominantly inattentive type: Secondary | ICD-10-CM

## 2016-06-25 MED ORDER — LISDEXAMFETAMINE DIMESYLATE 70 MG PO CAPS: 70.0000 mg | ORAL_CAPSULE | Freq: Every day | ORAL | 0 refills | Status: DC

## 2016-06-25 MED ORDER — VORTIOXETINE HBR 20 MG PO TABS: 20.0000 mg | ORAL_TABLET | Freq: Every day | ORAL | 0 refills | Status: DC

## 2016-06-25 NOTE — Progress Notes (Signed)
bhh

## 2016-06-25 NOTE — Progress Notes (Signed)
Pt needs refill on vyvanse today. Printed off.   Seeing behavioral health. Suggested written out of work until September 8th.

## 2016-06-25 NOTE — Psych (Signed)
   [facility]  Victoria Barnes [id number]  Session Time: 9 AM - 2:30 PM  Participation Level: Active  Behavioral Response: CasualAlertDepressed  Type of Therapy: Individual Therapy  Treatment Goals addressed: Coping, depression  Interventions: CBT, DBT, Supportive and Reframing  Summary: Clinician facilitated check-in regarding current stressors and situation, and review of patient completed diary card. Clinician utilized active listening and empathetic response and validated patient emotions. Clinician introduced topic of emotions. Clinician provided education on the role of emotions and the difference between feelings and emotions. Clinician also discussed tenets of cognitive distortions, dialectics, and healthy relationships throughout discussion.  Clinician assessed for immediate needs, medication compliance and efficacy, and safety concerns.    Suicidal/Homicidal: Nowithout intent/plan  Therapist Response: Victoria Barnes is a 30 y.o. female who presents with depressive symptoms. Patient presented within time allowed and states she is doing "okay." Patient rates depression symptoms "severe" today.  Patient actively engaged in discussion and utilized her personal experiences to illustrate her current understanding of topics. Patient participated in role playing to re-frame current perceptions. Patient reports understanding of topics discussed.  Patient demonstrates continued progress as evidenced by willingness to engage and open-ness. Patient is able to engage with information and show how it would play out in her life. Patient states "I feel like I can actually do this. Like I can make my life better."  Patient denies SI/HI/self-harm thoughts, endorses medication compliance and continued efficacy, and denies any immediate needs.    Plan: Patient will continue in PHP and medication management. Work towards decreasing depression symptoms and increase emotional  regulation and positive coping skills.    Diagnosis: Severe recurrent major depression without psychotic features (HCC) [F33.2]    1. Severe recurrent major depression without psychotic features [facility](HCC)      Donia GuilesJenny Mialynn Shelvin, LCSW 06/25/2016

## 2016-06-25 NOTE — Psych (Signed)
Behavioral Health Partial Program Assessment Note  Date: 06/25/2016 Name: Victoria Barnes MRN: [id number]  Chief Complaint: depression interfering with all aspects of her life  Subjective:dadness, inability to feel emotions, increased anger, poor motivation, interest and energy, poor sleep, wanting to avoid usual activities, tendency to isolate and some feelings life is not worth living   HPI: Patient is a 30 y.o. African American female presents with depressed mood.  Breaking up with her boyfriend she lives with, stressed with school and work and suddenly recalling childhood trauma that she has never dealt with. Increased irritability and anger.  The national situation with all its ugliness and racism is very disheartening and says with her 28 year old son she feels bad about his future.  Her childhood was problematic in that she was close to her father who was wonderful but also an alcoholic.  Her mother was and is self centered, cold and unpredictable.  Consequently Victoria Barnes learned to keep her feelings to herself and to be very independent.  She likes her independent nature but regrets her lack of feeling.  Her father died when she was [age] and that was traumatic.  She is not close to her mother to this day.  She did well in school and is currently working a job and attending college as well as being a single mother.  Her son is a joy to her.  All the stressors came together as her live in boyfriend is breaking up with her to some extent meaning they were already going their own ways even though they live together and she still loves him.  Now she plans to move out to give him space.  She is confident in her abilities to succeed but is concerned she has boxed in so many emotions over the years she has no room for any more stress without losing control..  Patient was enrolled in partial psychiatric program on 06/25/16.  Primary complaints include: agitation, anxiety, depression worse, difficulty  sleeping, difficulty with school, poor concentration, relationship difficulties, stressed at work and I don't want to go on living like this".  Onset of symptoms was gradual with gradually worsening course since that time. Psychosocial Stressors include the following: family, financial and occupational.   I have reviewed the history as related by the patient  Complaints of Pain: none Past Psychiatric History:  She has been taking medication which helps some  Currently in treatment with PCP  Substance Abuse History: None of significance Use of Alcohol: occasional, social use not of significance Use of Caffeine: other not an issue Use of over the counter: none of relevance  Past Surgical History:  Procedure Laterality Date  . BREAST ENHANCEMENT SURGERY Bilateral June 2015  . WISDOM TOOTH EXTRACTION      Past Medical History:  Diagnosis Date  . Eczema 2012   Outpatient Encounter Prescriptions as of 06/24/2016  Medication Sig  . BIOTIN PO Take by mouth.  . Clobetasol Prop Emollient Base (CLOBETASOL PROPIONATE E) 0.05 % emollient cream Use twice a day only as needed for eczema.  . clonazePAM (KLONOPIN) 0.5 MG tablet Take 1 tablet (0.5 mg total) by mouth 2 (two) times daily as needed for anxiety. Will cause sedation.  . fexofenadine (ALLEGRA ALLERGY) 180 MG tablet Take 1 tablet (180 mg total) by mouth daily.  [name]. FLUoxetine (PROZAC) 20 MG capsule Take 3 capsules (60 mg total) by mouth daily.  . Ginkgo Biloba 40 MG TABS Take by mouth.  . Levonorgestrel (SKYLA) 13.5  MG IUD by Intrauterine route.  [name]. lisdexamfetamine (VYVANSE) 70 MG capsule Take 1 capsule (70 mg total) by mouth daily. Do not refill 05/29/16.  . meloxicam (MOBIC) 15 MG tablet Take 1 tablet (15 mg total) by mouth daily.  . metroNIDAZOLE (METROGEL) 0.75 % vaginal gel Place 1 Applicatorful vaginally 2 (two) times daily.  . rizatriptan (MAXALT) 10 MG tablet Take 1 tablet (10 mg total) by mouth as needed for migraine. May repeat in  2 hours if needed  . tacrolimus (PROTOPIC) 0.1 % ointment Apply topically 2 (two) times daily. Only as needed to eczema spots.  . traZODone (DESYREL) 50 MG tablet Take 0.5-1 tablets (25-50 mg total) by mouth at bedtime as needed for sleep.  . valACYclovir (VALTREX) 1000 MG tablet Take 2 tablets twice a day for one day for outbreak.  . vortioxetine HBr (TRINTELLIX) 10 MG TABS Take 1 tablet (10 mg total) by mouth daily.   No facility-administered encounter medications on file as of 06/24/2016.    Allergies  Allergen Reactions  . Antacid [Alum & Mag Hydroxide-Simeth] Nausea And Vomiting  . Keflex [Cephalexin] Nausea And Vomiting  . Penicillins Nausea And Vomiting  . Viibryd [Vilazodone Hcl]     Abnormal Frightening Dreams    Social History  Substance Use Topics  . Smoking status: Current Some Day Smoker  . Smokeless tobacco: Not on file     Comment: Smoking 1 cig/month  . Alcohol use 2.4 oz/week    4 Standard drinks or equivalent per week   Functioning Relationships: strained with spouse or significant others and good relationship with children Education: College       Please specify degree: working towards a degree Other Pertinent History: None Family History  Problem Relation Age of Onset  . Hyperlipidemia Mother      Review of Systems Constitutional: negative  Objective:  There were no vitals filed for this visit.  Physical Exam: No exam performed today, no exam necessary.  Mental Status Exam: Appearance:  Well groomed Psychomotor::  Within Normal Limits Attention span and concentration: Normal Behavior: calm, cooperative and adequate rapport can be established Speech:  normal volume Mood:  anxious Affect:  normal and mood-congruent Thought Process:  Coherent Thought Content:  Illogical Orientation:  person, place and time/date Cognition:  grossly intact Insight:  Intact Judgment:  Intact Estimate of Intelligence: Above average Fund of  knowledge: Intact Memory: Recent and remote intact Abnormal movements: None Gait and station: Normal  Assessment:  Diagnosis: Severe recurrent major depression without psychotic features (HCC) [F33.2] 1. Severe recurrent major depression without psychotic features (HCC)     Indications for admission: severe depression interfering with work, school and relationships  Plan: patient enrolled in Partial Hospitalization Program  Treatment options and alternatives reviewed with patient and patient understands the above plan.   Comments: none .    Carolanne GrumblingGerald Daylen Hack, MD

## 2016-06-26 ENCOUNTER — Encounter: Payer: Self-pay | Admitting: Physician Assistant

## 2016-06-26 ENCOUNTER — Other Ambulatory Visit (HOSPITAL_COMMUNITY): Payer: 59 | Admitting: Specialist

## 2016-06-26 ENCOUNTER — Other Ambulatory Visit (HOSPITAL_COMMUNITY): Payer: 59 | Admitting: Licensed Clinical Social Worker

## 2016-06-26 DIAGNOSIS — F909 Attention-deficit hyperactivity disorder, unspecified type: Secondary | ICD-10-CM

## 2016-06-26 DIAGNOSIS — F17200 Nicotine dependence, unspecified, uncomplicated: Secondary | ICD-10-CM | POA: Diagnosis not present

## 2016-06-26 DIAGNOSIS — F332 Major depressive disorder, recurrent severe without psychotic features: Secondary | ICD-10-CM | POA: Diagnosis not present

## 2016-06-26 NOTE — Psych (Signed)
   [facility] PHP THERAPIST PROGRESS NOTE  Victoria Holtsbony T Keech [id number]  Session Time: 9 AM - 2:20 PM  Participation Level: Active  Behavioral Response: CasualAlertDepressed  Type of Therapy: Individual Therapy  Treatment Goals addressed: Coping, depression  Interventions: CBT, DBT, Supportive and Reframing  Summary: Clinician facilitated check-in regarding current stressors and situation, and review of patient completed diary card. Clinician utilized active listening and empathetic response and validated patient emotions. Clinician continued topic of emotions. Clinician utilized handout "Emotion Regulation" to educate patient on the following skills: opposite action, checking the facts, PLEASE, and pleasant events. Clinician discussed taking baby steps to have more effective emotion regulation. Tenets of interpersonal effectiveness, dialectics, and communication were addressed.  Clinician assessed for immediate needs, medication compliance and efficacy, and safety concerns.    Suicidal/Homicidal: Nowithout intent/plan  Therapist Response: Victoria Barnes is a 30 y.o. female who presents with depressive symptoms. Patient presented within time allowed and states she is doing "good." Patient reports decrease in anxiety and depression symptoms.  Patient reports understanding of topics discussed and remained actively engaged in discussion. Patient is able to relate current/past experiences to what is being discussed and ways she can alter her behavior to have more effective outcomes. Patient demonstrates continued progress as evidenced by willingness to engage and open-ness. Patient reports "I am starting to be able to imagine not being an angry person all the time. And having more friends." Patient denies SI/HI/self-harm thoughts, endorses medication compliance and continued efficacy, and denies any immediate needs.    Plan: Patient will continue in PHP and medication management. Work towards  decreasing depression symptoms and increase emotional regulation and positive coping skills.    Diagnosis: Severe recurrent major depression without psychotic features (HCC) [F33.2]    1. Severe recurrent major depression without psychotic features [facility](HCC)      Donia GuilesJenny Keamber Macfadden, LCSW 06/26/2016

## 2016-06-26 NOTE — Therapy (Signed)
[facility] [address] [place], [place], [address] Phone: [phone]   Fax:  [phone]  Occupational Therapy Evaluation  Patient Details  Name: Victoria Barnes MRN: [id number] Date of Birth: 1985-11-30 No Data Recorded  Encounter Date: 06/26/2016      OT End of Session - 06/26/16 2048    Visit Number 1   Number of Visits 6   Date for OT Re-Evaluation 07/17/16   Author[facility] Type [facility]   OT Start Time 1035   OT Stop Time 1135   OT Time Calculation (min) 60 min   Activity Tolerance Patient tolerated treatment well   Behavior [facility] for tasks assessed/performed      Past Medical History:  Diagnosis Date  . Eczema 2012    Past Surgical History:  Procedure Laterality Date  . BREAST ENHANCEMENT SURGERY Bilateral June 2015  . WISDOM TOOTH EXTRACTION      There were no vitals filed for this visit.      Subjective Assessment - 06/26/16 2046    Subjective  S:  I just can't deal with all of the negativity in the world.  I take it personally.    Currently in Pain? No/denies            OT assessment Diagnosis Major Depressive Disorder  Past medical history:  ADHD, Depression Living situation:  Lives with her 28 year old son and her friend Peyton NajjarLarry, has a boyfriend that lives outside of the home.  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  Family is local but not super involved. Struggles:  Social skills, time management, sleep.   OT goal:  same General Causality Orientation Scale   Subscore Percentile Score  Autonomy 52 45.61  Control 58 70.82  Impersonal 18 .85   Motivation Type  Motivation type Explanation  o    o Control-oriented The individual's behavior is likely determined by internal or external factors,  and often associated with subjective feelings of "have-to" or sense of coercion.  Motivational interventions may benefit and prevent potential motivational deterioration over time.  o    o    Assessment:  Patient demonstrates control oriented motivation Her behavior is likely determined by internal or external factors and often combined with feelings of "have to".  Patient will benefit from occupational therapy intervention in order to improve time management, financial management, stress management, job readiness skills, social skills,sleep hygiene, exercise and healthy eating habits,  and health management skills and other psychosocial skills needed for preparation to return to full time community living and to be a productive community member.   Plan:  Patient will participate in skilled occupational therapy sessions individually or in a group setting to improve coping skills, psychosocial skills, and emotional skills required to return to prior level of function as a productive community member. Treatment will be 1-2 times per week for 2-6 weeks.                             OT Education - 06/26/16 2046    Education provided Yes   Education Details Patient educated on social skills and communication strategies for beginning and ending conversations and self esteem and its affects and reactions from many factors in the world.  Person(s) Educated Patient   Methods Explanation;Demonstration;Handout   Comprehension Verbalized understanding          OT Short Term Goals - 06/26/16 2052      OT SHORT TERM GOAL #1   Title Patient will be educated on strategies to improve psychosocial skills needed to participate fully in all daily, work, and leisure activities.   Time 3   Period Weeks   Status New     OT SHORT TERM GOAL #2   Title Patient will be educated on a HEP and independent with implementation of HEP.   Time 3   Period Weeks   Status New     OT SHORT TERM  GOAL #3   Title Patient will independently apply psychosocial skills and coping mechanisms to her daily actiivties in order to function independently.   Time 3   Period Weeks   Status New                  Plan - 06/26/16 2107    Rehab Potential Good   OT Frequency --  1-2 times per week   OT Duration --  3 weeks   OT Treatment/Interventions Self-care/ADL training  pyschosocial skills building, coping mechanism training      Patient will benefit from skilled therapeutic intervention in order to improve the following deficits and impairments:   (coping strategies, pyschosocial skills.)  Visit Diagnosis: Severe recurrent major depression without psychotic features (HCC)  Attention deficit hyperactivity disorder (ADHD), unspecified ADHD type    Problem List Patient Active Problem List   Diagnosis Date Noted  . 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 Nausea without vomiting 05/24/2016  . Migraine without aura and with status migrainosus, not intractable 05/22/2016  . Insomnia 05/22/2016  . Migraine headache 02/26/2016  . Attention deficit hyperactivity disorder (ADHD) 01/08/2016  . 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 Abnormal weight gain 07/19/2015  . Eczema 07/17/2015  . Depression 07/17/2015    Shirlean MylarBethany H. Taiwo Fish, MHA, OTR/L [phone]  06/26/2016, 9:08 PM  [facility] [address] [place], [place], [address] Phone: [phone]   Fax:  [phone]  Name: Victoria Barnes MRN: [id number] Date of Birth: [birth year]

## 2016-06-27 ENCOUNTER — Other Ambulatory Visit (HOSPITAL_COMMUNITY): Payer: 59 | Admitting: Licensed Clinical Social Worker

## 2016-06-27 ENCOUNTER — Encounter: Payer: Self-pay | Admitting: Physician Assistant

## 2016-06-27 DIAGNOSIS — F17200 Nicotine dependence, unspecified, uncomplicated: Secondary | ICD-10-CM | POA: Diagnosis not present

## 2016-06-27 DIAGNOSIS — F332 Major depressive disorder, recurrent severe without psychotic features: Secondary | ICD-10-CM

## 2016-06-27 NOTE — Psych (Signed)
   [facility]  Victoria Barnes [id number]  Session Time: 9 AM - 1:00 PM  Participation Level: Active  Behavioral Response: CasualAlertDepressed  Type of Therapy: Individual Therapy  Treatment Goals addressed: Coping, depression  Interventions: CBT, DBT, Supportive and Reframing  Summary: Clinician facilitated check-in regarding current stressors and situation, and review of patient completed diary card. Clinician utilized active listening and empathetic response and validated patient emotions. Clinician introduced topic of healthy relationships. Clinician educated patient on the foundations of a healthy relationship and discussed what patient wants out of a relationship and how to achieve that.  Clinician assessed for immediate needs, medication compliance and efficacy, and safety concerns.    Suicidal/Homicidal: Nowithout intent/plan  Therapist Response: Victoria Barnes is a 30 y.o. female who presents with depressive symptoms. Patient presented within time allowed and states she is doing "pretty good." Patient reports decrease in anxiety symptoms and improved focus.  Patient reports understanding of topics discussed and remained actively engaged in discussion. Patient is able to make realistic goals for what she wants out of a relationship and was insightful regarding ways she needs to alter her behaviors to attract what she wants.   Patient continues to make progress as evidenced by sharing ways in which she utilized skills discussed in group when she goes home and states she is having positive outcomes. Patient denies SI/HI/self-harm thoughts, endorses medication compliance and continued efficacy, and denies any immediate needs.    Plan: Patient will continue in PHP and medication management. Work towards decreasing depression symptoms and increase emotional regulation and positive coping skills.    Diagnosis: Severe recurrent major depression without  psychotic features (HCC) [F33.2]    1. Severe recurrent major depression without psychotic features [facility](HCC)      Donia GuilesJenny Nioka Thorington, LCSW 06/27/2016

## 2016-06-30 ENCOUNTER — Other Ambulatory Visit (HOSPITAL_COMMUNITY): Payer: 59 | Admitting: Licensed Clinical Social Worker

## 2016-06-30 DIAGNOSIS — F17200 Nicotine dependence, unspecified, uncomplicated: Secondary | ICD-10-CM | POA: Diagnosis not present

## 2016-06-30 DIAGNOSIS — F332 Major depressive disorder, recurrent severe without psychotic features: Secondary | ICD-10-CM | POA: Diagnosis not present

## 2016-06-30 NOTE — Psych (Signed)
   [facility]  Victoria Barnes [id number]  Session Time: 9 AM - 2:00 PM  Participation Level: Active  Behavioral Response: CasualAlertDepressed  Type of Therapy: Individual Therapy  Treatment Goals addressed: Coping, depression  Interventions: CBT, DBT, Supportive and Reframing  Summary: Clinician facilitated check-in regarding current stressors and situation, and review of patient completed diary card. Clinician utilized active listening and empathetic response and validated patient emotions. Clinician reviewed skills completed last week and role played honing these skills. Clinician educated patient on interpersonal effectiveness skills GIVE and FAST. Clinician discussed cognitive distortions, utilizing handout "Common Unhealthy Thought Patterns." Clinician encouraged patient to consider how these distortions play out in her life and how to counteract them.  Clinician assessed for immediate needs, medication compliance and efficacy, and safety concerns.    Suicidal/Homicidal: Nowithout intent/plan  Therapist Response: Victoria Barnes is a 30 y.o. female who presents with depressive symptoms. Patient presented within time allowed and states she is doing "alright." Patient reports increased concentration, appetite, and emotional spectrum. Patient reports she can notice an improvement from her medication.  Patient shared situations from the weekend in which she used skills or tried to use skills. Patient engaged in workshopping and role playing the situations to increase effectiveness in the future. Patient reports understanding of topics discussed and is able to identify ways in which they have effected her life.   Patient continues to make progress as evidenced by demonstrating increased ability to identify emotions other than anger and increased utilization of skills.  Patient denies SI/HI/self-harm thoughts, endorses medication compliance and continued efficacy,  and denies any immediate needs.    Plan: Patient will continue in PHP and medication management. Work towards decreasing depression symptoms and increase emotional regulation and positive coping skills.    Diagnosis: Severe recurrent major depression without psychotic features (HCC) [F33.2]    1. Severe recurrent major depression without psychotic features [facility](HCC)      Donia GuilesJenny Areyana Leoni, LCSW 06/30/2016

## 2016-07-01 ENCOUNTER — Ambulatory Visit (HOSPITAL_COMMUNITY): Payer: Self-pay

## 2016-07-01 ENCOUNTER — Other Ambulatory Visit (HOSPITAL_COMMUNITY): Payer: Self-pay

## 2016-07-02 ENCOUNTER — Other Ambulatory Visit (HOSPITAL_COMMUNITY): Payer: 59 | Admitting: Licensed Clinical Social Worker

## 2016-07-02 DIAGNOSIS — F332 Major depressive disorder, recurrent severe without psychotic features: Secondary | ICD-10-CM

## 2016-07-02 DIAGNOSIS — F17200 Nicotine dependence, unspecified, uncomplicated: Secondary | ICD-10-CM | POA: Diagnosis not present

## 2016-07-03 ENCOUNTER — Other Ambulatory Visit (HOSPITAL_COMMUNITY): Payer: 59 | Attending: Psychiatry | Admitting: Licensed Clinical Social Worker

## 2016-07-03 ENCOUNTER — Other Ambulatory Visit (HOSPITAL_COMMUNITY): Payer: 59 | Admitting: Specialist

## 2016-07-03 DIAGNOSIS — F909 Attention-deficit hyperactivity disorder, unspecified type: Secondary | ICD-10-CM

## 2016-07-03 DIAGNOSIS — F332 Major depressive disorder, recurrent severe without psychotic features: Secondary | ICD-10-CM | POA: Diagnosis not present

## 2016-07-03 DIAGNOSIS — F322 Major depressive disorder, single episode, severe without psychotic features: Secondary | ICD-10-CM

## 2016-07-03 DIAGNOSIS — F17200 Nicotine dependence, unspecified, uncomplicated: Secondary | ICD-10-CM | POA: Diagnosis not present

## 2016-07-03 NOTE — Psych (Signed)
   [facility]  Victoria Barnes [id number]  Session Time: 9 AM - 2:00 PM  Participation Level: Active  Behavioral Response: CasualAlertDepressed  Type of Therapy: Individual Therapy  Treatment Goals addressed: Coping, depression  Interventions: CBT, DBT, Supportive and Reframing  Summary: Clinician facilitated check-in regarding current stressors and situation, and review of patient completed diary card. Clinician utilized active listening and empathetic response and validated patient emotions.   Clinician reviewed skills discussed previously and how patient has utilized them. Clinician introduced topic of healthy communication. Clinician educated patient on communication types, active listening, and I statements. Clinician and patient discussed how to utilize skills in patient's life.  Clinician discussed discharge with patient and progress made. Clinician processed patient's feelings about discharge and Tuesday 9/5 is the tentative mutually agreed upon discharge date. Clinician assessed for immediate needs, medication compliance and efficacy, and safety concerns.    Suicidal/Homicidal: Nowithout intent/plan  Therapist Response: Victoria Barnes is a 30 y.o. female who presents with depressive symptoms. Patient presented within time allowed and states she is doing "good." Patient reports "leveling out" and feeling more positive than yesterday with a decrease in symptoms.  Patient shared how she utilized skills discussed since last session. Patient engaged in workshopping and role playing the situations to increase effectiveness in the future. Patient engaged with topics discussed and was able to recognize situations in which these skills would have increase effective outcomes.  Patient discussed upcoming discharge. Patient was able to identify ways in which she has made progress in her goals of increasing emotional regulation, identifying feelings, decreasing  depression sym,ptoms, and increasing interpersonal effectiveness. Patient expressed anxieties about discharge and was able to talk through them utilizing skills. Patient agrees to planned upcoming discharge date. Patient continues to make progress as evidenced by reports of practicing skills at home, increased insight and awareness in discussion, and making forward thinking statements and goals. Patient denies SI/HI/self-harm thoughts, endorses medication compliance and continued efficacy, and denies any immediate needs.    Plan: Patient will continue in PHP and medication management. Work towards decreasing depression symptoms and increase emotional regulation and positive coping skills.    Diagnosis: Severe recurrent major depression without psychotic features (HCC) [F33.2]    1. Severe recurrent major depression without psychotic features [facility](HCC)      Donia GuilesJenny Brannan Cassedy, LCSW 07/03/2016

## 2016-07-03 NOTE — Psych (Signed)
   [facility]  Victoria Holtsbony T Eugenio [id number]  Session Time: 9 AM - 2:00 PM  Participation Level: Active  Behavioral Response: CasualAlertDepressed  Type of Therapy: Individual Therapy  Treatment Goals addressed: Coping, depression  Interventions: CBT, DBT, Supportive and Reframing  Summary: Clinician facilitated check-in regarding current stressors and situation, and review of patient completed diary card. Clinician utilized active listening and empathetic response and validated patient emotions. Clinician discussed patient's absence yesterday and the reasons for it.  Clinician reviewed skills discussed previously and role played honing these skills. Clinician introduced topics of assertiveness, self esteem, and boundaries. Clinician and patient discussed how increasing skillfulness in these topics can effect well being.  Clinician assessed for immediate needs, medication compliance and efficacy, and safety concerns.    Suicidal/Homicidal: Nowithout intent/plan  Therapist Response: Victoria Barnes is a 30 y.o. female who presents with depressive symptoms. Patient presented within time allowed and states she is doing "okay." Patient reports a decrease in appetite and concentration, and rates her depression and anxiety higher than during the last session. Patient shares struggling yesterday with hopelessness and that is why she did not attend group. Patient stated ways to reframe her thoughts from yesterday in a more healthy manner.  Patient shared how she utilized skills discussed since last session. Patient engaged in workshopping and role playing the situations to increase effectiveness in the future. Patient reports understanding of topics discussed and is able to identify ways in which they have effected her life. Patient is able to connect her life events to what is being discussed in group and how that can change her interactions.   Patient continues to make  progress as evidenced by continued effort in sessions, introspection, and connection of what is discussed to her every day life. Patient effectively re-framed her set back yesterday.  Patient denies SI/HI/self-harm thoughts, endorses medication compliance and continued efficacy, and denies any immediate needs.    Plan: Patient will continue in PHP and medication management. Work towards decreasing depression symptoms and increase emotional regulation and positive coping skills.    Diagnosis: Severe recurrent major depression without psychotic features (HCC) [F33.2]    1. Severe recurrent major depression without psychotic features [facility](HCC)      Donia GuilesJenny Zanovia Rotz, LCSW 07/03/2016

## 2016-07-03 NOTE — Therapy (Signed)
[facility] PARTIAL HOSPITALIZATION PROGRAM [address] [place], [place], [address] Phone: [phone]   Fax:  [phone]  Occupational Therapy Treatment  Patient Details  Name: Victoria Barnes MRN: [id number] Date of Birth: [birth year] No Data Recorded  Encounter Date: 07/03/2016      OT End of Session - 07/03/16 2144    Visit Number 2   Number of Visits 6   Date for OT Re-Evaluation 07/17/16   Authorization Type [facility] [employer]      Past Medical History:  Diagnosis Date  . Eczema 2012    Past Surgical History:  Procedure Laterality Date  . BREAST ENHANCEMENT SURGERY Bilateral June 2015  . WISDOM TOOTH EXTRACTION      There were no vitals filed for this visit.      Subjective Assessment - 07/03/16 2143    Subjective  S:  Being here has really helped.  I am learning that I have a lot of great ideas, I was just afraid to use them.        O:  Patient actively participated in the following skilled occupational therapy group this date: o Time management:  What is good vs bad time management, effects of good vs bad time management, strategies to improve time management including varying levels of structure and using a daily schedule.  Victoria Barnes does well managing time at work, but not at home. Discussed strategies to improve time management at home in order to spend less time studying and more time on son, leisure activities, and sleep. Patient was engaged in discussion throughout entire session.  A:  Patient participated in skilled occupational therapy group for time management  skills this date.  Patient was engage and has committed to utilizing a segmented schedule when working on schoolwork to allow for more leisure and sleep time. P:  Continue participation in skilled occupational therapy groups  1-2 times per week for 1 weeks in order to gain the necessary skills needed to return to full time community living and learn effective coping strategies to  be a productive community resident.  Group will address sleep hygiene next session.                         OT Education - 07/03/16 2143    Education provided Yes   Education Details Patient educated on time management skills, including varying structure, strategies to stay focused   Person(s) Educated Patient   Methods Explanation;Handout;Verbal cues   Comprehension Verbalized understanding          OT Short Term Goals - 07/03/16 2144      OT SHORT TERM GOAL #1   Title Patient will be educated on strategies to improve psychosocial skills needed to participate fully in all daily, work, and leisure activities.   Time 3   Period Weeks   Status On-going     OT SHORT TERM GOAL #2   Title Patient will be educated on a HEP and independent with implementation of HEP.   Time 3   Period Weeks   Status On-going     OT SHORT TERM GOAL #3   Title Patient will independently apply psychosocial skills and coping mechanisms to her daily actiivties in order to function independently.   Time 3   Period Weeks   Status On-going                Patient will benefit from skilled therapeutic intervention in order to  improve the following deficits and impairments:     Visit Diagnosis: Severe recurrent major depression without psychotic features (HCC)  Attention deficit hyperactivity disorder (ADHD), unspecified ADHD type  Severe single current episode of major depressive disorder, without psychotic features [facility](HCC)    Problem List Patient Active Problem List   Diagnosis Date Noted  . 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 Nausea without vomiting 05/24/2016  . Migraine without aura and with status migrainosus, not intractable 05/22/2016  . Insomnia 05/22/2016  . Migraine headache 02/26/2016  . Attention deficit hyperactivity  disorder (ADHD) 01/08/2016  . 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 Abnormal weight gain 07/19/2015  . Eczema 07/17/2015  . Depression 07/17/2015    Shirlean MylarBethany H. Murray, MHA, OTR/L [phone]  07/03/2016, 9:46 PM  [facility] PART[facility]PITAL[facility] PROGRAM [address] [place], [place], [address] Phone: [phone]   Fax:  [phone]  Name: Victoria Barnes MRN: [id number] Date of Birth: 08/18/1986

## 2016-07-04 ENCOUNTER — Telehealth: Payer: Self-pay | Admitting: Physician Assistant

## 2016-07-04 ENCOUNTER — Other Ambulatory Visit (HOSPITAL_COMMUNITY): Payer: 59 | Attending: Psychiatry | Admitting: Licensed Clinical Social Worker

## 2016-07-04 DIAGNOSIS — F332 Major depressive disorder, recurrent severe without psychotic features: Secondary | ICD-10-CM | POA: Diagnosis not present

## 2016-07-04 DIAGNOSIS — E559 Vitamin D deficiency, unspecified: Secondary | ICD-10-CM | POA: Diagnosis not present

## 2016-07-04 MED FILL — VYVANSE 70 MG CAPSULE: 70 | 30 days supply | Qty: 30 | Fill #0

## 2016-07-04 MED FILL — TRINTELLIX 20 MG TABLET: 20 | 30 days supply | Qty: 30 | Fill #0

## 2016-07-04 NOTE — Telephone Encounter (Signed)
Pt needs updated work note. Current return to work date is on a Friday. Will change to Monday.

## 2016-07-04 NOTE — Psych (Signed)
   [facility]  Victoria Barnes [id number]  Session Time: 9 AM - 1:00 PM  Participation Level: Active  Behavioral Response: CasualAlertEuthymic  Type of Therapy: Individual Therapy  Treatment Goals addressed: Coping, depression  Interventions: CBT, DBT, Supportive and Reframing  Summary: Clinician facilitated check-in regarding current stressors and situation, and review of patient completed diary card. Clinician utilized active listening and empathetic response and validated patient emotions.   Clinician reviewed skills discussed previously and how patient has utilized them. Clinician introduced the skill of DEARMAN. Clinician and patient discussed topics of vulnerability, long term effectiveness, and feeling dis-empowered.  Clinician assessed for immediate needs, medication compliance and efficacy, and safety concerns.    Suicidal/Homicidal: Nowithout intent/plan  Therapist Response: Victoria Holtsbony T Kleinman is a 30 y.o. female who presents with depressive symptoms. Patient presented within time allowed and states she is doing "good." Patient reports decrease in anxiety and depression symptoms and states "I can see the improvement."  Patient shared how she utilized skills discussed since last session. Patient engaged with new skills discussed and how to continue to integrate previously discussed skills into her every day. Patient discussed how the social environment is effecting her daily life and was able to determine ways to empower herself.  Patient continues to make progress as evidenced by having realizations of unhealthy habits, and ways to increase healthy principles. Patient is not meeting all of the afternoon goals she sets, however is meeting more as time progresses. Patient denies SI/HI/self-harm thoughts, endorses medication compliance and continued efficacy, and denies any immediate needs.    Plan: Patient will continue in PHP and medication management. Work  towards decreasing depression symptoms and increase emotional regulation and positive coping skills.    Diagnosis: Severe recurrent major depression without psychotic features (HCC) [F33.2]    1. Severe recurrent major depression without psychotic features [facility](HCC)      Donia GuilesJenny Montrey Buist, LCSW 07/04/2016

## 2016-07-07 ENCOUNTER — Other Ambulatory Visit (HOSPITAL_COMMUNITY): Payer: Self-pay

## 2016-07-08 ENCOUNTER — Other Ambulatory Visit (HOSPITAL_COMMUNITY): Payer: 59 | Admitting: Specialist

## 2016-07-08 ENCOUNTER — Other Ambulatory Visit (HOSPITAL_COMMUNITY): Payer: 59 | Admitting: Licensed Clinical Social Worker

## 2016-07-08 DIAGNOSIS — F332 Major depressive disorder, recurrent severe without psychotic features: Secondary | ICD-10-CM

## 2016-07-08 DIAGNOSIS — E559 Vitamin D deficiency, unspecified: Secondary | ICD-10-CM | POA: Diagnosis not present

## 2016-07-08 DIAGNOSIS — F909 Attention-deficit hyperactivity disorder, unspecified type: Secondary | ICD-10-CM

## 2016-07-08 NOTE — Progress Notes (Signed)
Partial hospital discharge note  Date of discharge:  07/08/2016  Partial hospital stay:  Ms Victoria Barnes attended and participated in the program.  She was remarkably quick in picking up what she was taught and putting it into practice.  The skills worked and reinforced their use.  Overall she found the experience helpful.  She is no longer as depressed as she was and looks forward to her future.  Mental status revealed no suicidal thoughts  Final diagnosis is Major depression, recurrent, severe without psychotic features  Plan:  Scheduled with a psychiatrist and therapist

## 2016-07-08 NOTE — Psych (Signed)
   [facility] BH PHP THERAPIST PROGRESS NOTE  Victoria Barnes [id number]  Session Time: 9 AM - 2:15 PM  Participation Level: Active  Behavioral Response: CasualAlertEuthymic  Type of Therapy: Individual Therapy  Treatment Goals addressed: Coping, depression  Interventions: CBT, DBT, Supportive and Reframing  Summary: Clinician facilitated check-in regarding current stressors and situation, and review of patient completed diary card. Clinician utilized active listening and empathetic response and validated patient emotions.   Clinician reviewed skills discussed previously and how patient has utilized them. Clinician introduced distress tolerance skills of STOP, ACCEPTS, and Self-Soothe. Clinician led discussion of how to practically apply these skills. Clinician discussed discharge and processed patient's feelings. Clinician validated patient's progresses and goals that were met. Clinician led review and "skill flash cards" activity.   Clinician assessed for immediate needs, medication compliance and efficacy, and safety concerns.    Suicidal/Homicidal: Nowithout intent/plan  Therapist Response: Victoria Barnes is a 30 y.o. female who presents with depressive symptoms. Patient presented within time allowed and states she is doing "really good." Patient reports decrease in depression symptoms, increased concentration, appetite, and energy level. Patient reports she feels she has made "good progress" in the program.  Patient shared how she utilized skills discussed since last session. Patient engaged with new skills discussed and how to continue to integrate previously discussed skills into her every day. Patient demonstrated understanding of skill development by participating in review and accurately explaining the purpose of each skill and how to utilize it.  Patient demonstrates progress as evidenced by increased insight into her motivations and problematic behaviors and thought patterns. Patient  reports decreased anxiety and depression symptoms which is evidenced by decreased PHQ-9 and GAD scores. Patient demonstrates increased ability to manage emotional regulation and distress tolerance as evidenced by proficiency in correlation DBT skills. Patient shares a number of life changes she has made which align with her goals and more healthy thought patterns. Patient denies SI/HI/self-harm thoughts, endorses medication compliance and continued efficacy, and denies any immediate needs.  Patient reports comfort with discharge and states she feels "confident" that she can maintain the positive steps she has made while in treatment.    Plan: Patient will discharge from [facility] today. Patient reports desire to stay with her therapist and the PA who is currently managing her medications. Patient has a medication appointment tomorrow, 07/09/16, and will follow up with her therapist for next appointment.   Diagnosis: Severe recurrent major depression without psychotic features ([place]) [F33.2]    1. Severe recurrent major depression without psychotic features [facility])      Lorin Glass, LCSW 07/08/2016

## 2016-07-08 NOTE — Therapy (Signed)
[facility] [address] [place], [place], [address] Phone: [phone]   Fax:  [phone]  Occupational Therapy Treatment  Patient Details  Name: Victoria Barnes MRN: [id number] Date of Birth: 1986/01/07 No Data Recorded  Encounter Date: 07/08/2016      OT End of Session - 07/08/16 1604    Visit Number 3   Number of Visits 6   Date for OT Re-Evaluation 07/17/16   Authorization Type [employer]   OT Start Time 1040   OT Stop Time 1125   OT Time Calculation (min) 45 min      Past Medical History:  Diagnosis Date  . Eczema 2012    Past Surgical History:  Procedure Laterality Date  . BREAST ENHANCEMENT SURGERY Bilateral June 2015  . WISDOM TOOTH EXTRACTION      There were no vitals filed for this visit.      Subjective Assessment - 07/08/16 1528    Subjective  S:  I really have learned alot while I was here.  This weekend, I used my timer to stay on task while I was looking at houses.   Currently in Pain? No/denies        O:  Patient actively participated in the following skilled occupational therapy group this date:  o Sleep hygiene including tactics to change personal, environmental factors to aide in better sleep.  Strategies to improve quality of sleep, ability to fall asleep.    A:  Patient participated in skilled occupational therapy group for sleep hygiene skills this date.  Patient was fully engaged in discussion, able to identify ideal vs non-ideal strategies to utilize to improve her sleep quality.  Patient has met 3/3 short term goals this date and is ready for dc from skilled OT intervention.  P:  DC from skilled OT intervention.                          OT Education - 07/08/16 1603    Education provided Yes   Education Details Patient educated on effective sleep hygiene tactics to improve her ability to fall asleep, get quality sleep.   Person(s) Educated Patient   Methods Explanation;Handout   Comprehension Verbalized understanding          OT Short Term Goals - 07/08/16 1605      OT SHORT TERM GOAL #1   Title Patient will be educated on strategies to improve psychosocial skills needed to participate fully in all daily, work, and leisure activities.   Time 3   Period Weeks   Status Achieved     OT SHORT TERM GOAL #2   Title Patient will be educated on a HEP and independent with implementation of HEP.   Time 3   Period Weeks   Status Achieved     OT SHORT TERM GOAL #3   Title Patient will independently apply psychosocial skills and coping mechanisms to her daily actiivties in order to function independently.   Time 3   Period Weeks   Status Achieved                Patient will benefit from skilled therapeutic intervention in order to improve the following deficits and impairments:   (coping stratgies, psychosocial skills )  Visit Diagnosis: Severe recurrent major depression without psychotic features ([place])  Attention deficit hyperactivity disorder (ADHD), unspecified ADHD type    Problem List Patient Active Problem List   Diagnosis  Date Noted  . Severe recurrent major depression without psychotic features ([place]) 06/24/2016  . 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 Nausea without vomiting 05/24/2016  . Migraine without aura and with status migrainosus, not intractable 05/22/2016  . Insomnia 05/22/2016  . Migraine headache 02/26/2016  . Attention deficit hyperactivity disorder (ADHD) 01/08/2016  . 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 Abnormal weight gain 07/19/2015  . Eczema 07/17/2015  . Depression 07/17/2015    Vangie Bicker, MHA, OTR/L [phone] OCCUPATIONAL THERAPY DISCHARGE SUMMARY  Visits from Start of Care: 3  Current functional level related to goals / functional outcomes: Independently identifying strategies in area of time management, social  skills, and sleep hygiene that will improve her ability to participate fully in her ADLs and community activities.    Remaining deficits: n/a   Education / Equipment: Strategies for improving social skills, self esteem, time management, and sleep hygiene.  Plan: Patient agrees to discharge.  Patient goals were partially met. Patient is being discharged due to meeting the stated rehab goals.  ?????      07/08/2016, 4:06 PM    Vangie Bicker, MHA, OTR/L [place] [address] [place], [place], [address] Phone: [phone]   Fax:  [phone]  Name: Victoria Barnes MRN: [id number] Date of Birth: November 24, 1985

## 2016-07-09 ENCOUNTER — Other Ambulatory Visit (HOSPITAL_COMMUNITY): Payer: 59

## 2016-07-09 ENCOUNTER — Ambulatory Visit: Payer: Self-pay | Admitting: Physician Assistant

## 2016-07-10 ENCOUNTER — Ambulatory Visit (HOSPITAL_COMMUNITY): Payer: Self-pay

## 2016-07-10 ENCOUNTER — Other Ambulatory Visit (HOSPITAL_COMMUNITY): Payer: Self-pay

## 2016-07-10 ENCOUNTER — Ambulatory Visit: Payer: Self-pay | Admitting: Licensed Clinical Social Worker

## 2016-07-11 ENCOUNTER — Other Ambulatory Visit (HOSPITAL_COMMUNITY): Payer: Self-pay

## 2016-07-14 ENCOUNTER — Other Ambulatory Visit (HOSPITAL_COMMUNITY): Payer: Self-pay

## 2016-07-15 ENCOUNTER — Other Ambulatory Visit (HOSPITAL_COMMUNITY): Payer: Self-pay

## 2016-07-15 ENCOUNTER — Ambulatory Visit (HOSPITAL_COMMUNITY): Payer: Self-pay

## 2016-07-16 ENCOUNTER — Other Ambulatory Visit (HOSPITAL_COMMUNITY): Payer: Self-pay

## 2016-07-17 ENCOUNTER — Ambulatory Visit (HOSPITAL_COMMUNITY): Payer: Self-pay

## 2016-07-17 ENCOUNTER — Other Ambulatory Visit (HOSPITAL_COMMUNITY): Payer: Self-pay

## 2016-07-18 ENCOUNTER — Other Ambulatory Visit (HOSPITAL_COMMUNITY): Payer: Self-pay

## 2016-07-21 ENCOUNTER — Ambulatory Visit (INDEPENDENT_AMBULATORY_CARE_PROVIDER_SITE_OTHER): Payer: 59 | Admitting: Physician Assistant

## 2016-07-21 ENCOUNTER — Encounter: Payer: Self-pay | Admitting: Physician Assistant

## 2016-07-21 VITALS — BP 93/60 | HR 98 | Ht 59.0 in | Wt 111.0 lb

## 2016-07-21 DIAGNOSIS — F332 Major depressive disorder, recurrent severe without psychotic features: Secondary | ICD-10-CM | POA: Diagnosis not present

## 2016-07-21 MED ORDER — VORTIOXETINE HBR 20 MG PO TABS: 20.0000 mg | ORAL_TABLET | Freq: Every day | ORAL | 1 refills | Status: DC

## 2016-07-21 NOTE — Progress Notes (Signed)
   Subjective:    Patient ID: Victoria Barnes, female    DOB: 23-Aug-1986, 30 y.o.   MRN: [id number]  HPI  Pt is here to follow up on acute depression. Pt is doing much better. She is taking trintellix daily and klonapin as needed but not daily. She completed 9 days of 5 hours a day counseling at [facility]. She is to follow up with counselor. She is back at work. She is doing much better. She "feels like the old her is back". She is moving out on her on. She broke up with boyfriend. She denies any sucidial thoughts.    Review of Systems  All other systems reviewed and are negative.      Objective:   Physical Exam  Constitutional: She is oriented to person, place, and time. She appears well-developed and well-nourished.  HENT:  Head: Normocephalic and atraumatic.  Cardiovascular: Normal rate, regular rhythm and normal heart sounds.   Pulmonary/Chest: Effort normal and breath sounds normal.  Neurological: She is alert and oriented to person, place, and time.  Psychiatric: She has a normal mood and affect. Her behavior is normal.          Assessment & Plan:  Severe recurrent major depression without psychotic features- PHQ-9 was 8 and GAD-7 was 11. Screening numbers Down by over 1/2. Refilled trintellix for 6 months. Continue to follow up with julie whit.

## 2016-07-25 ENCOUNTER — Encounter: Payer: Self-pay | Admitting: Physician Assistant

## 2016-08-04 ENCOUNTER — Other Ambulatory Visit: Payer: Self-pay | Admitting: *Deleted

## 2016-08-04 DIAGNOSIS — F900 Attention-deficit hyperactivity disorder, predominantly inattentive type: Secondary | ICD-10-CM

## 2016-08-04 MED ORDER — LISDEXAMFETAMINE DIMESYLATE 70 MG PO CAPS: 70.0000 mg | ORAL_CAPSULE | Freq: Every day | ORAL | 0 refills | Status: DC

## 2016-08-04 MED FILL — RIZATRIPTAN 10 MG TABLET: 10 | 30 days supply | Qty: 9 | Fill #1

## 2016-08-04 MED FILL — metroNIDAZOLE 0.75 % GEL: 0.75 | 5 days supply | Qty: 70 | Fill #3

## 2016-08-04 MED FILL — VYVANSE 70 MG CAPSULE: 70 | 30 days supply | Qty: 30 | Fill #0

## 2016-08-04 MED FILL — traZODone HCL 50 MG TABS: 50 | 30 days supply | Qty: 30 | Fill #1

## 2016-08-04 MED FILL — TRINTELLIX 20 MG TABLET: 20 | 90 days supply | Qty: 90 | Fill #0

## 2016-08-04 MED FILL — FLUCONAZOLE 150 MG TABLET: 150 | 2 days supply | Qty: 2 | Fill #3

## 2016-08-05 ENCOUNTER — Other Ambulatory Visit: Payer: Self-pay | Admitting: Physician Assistant

## 2016-08-05 ENCOUNTER — Telehealth: Payer: Self-pay

## 2016-08-05 MED ORDER — LINACLOTIDE 290 MCG PO CAPS: 290.0000 ug | ORAL_CAPSULE | Freq: Every day | ORAL | 3 refills | Status: DC

## 2016-08-05 NOTE — Telephone Encounter (Signed)
Pt would like to try Linzess, has tried OTC treatment with no relief. Please assist.

## 2016-08-05 NOTE — Telephone Encounter (Signed)
Sent!

## 2016-08-06 ENCOUNTER — Ambulatory Visit (INDEPENDENT_AMBULATORY_CARE_PROVIDER_SITE_OTHER): Payer: 59 | Admitting: Sports Medicine

## 2016-08-06 ENCOUNTER — Encounter: Payer: Self-pay | Admitting: Sports Medicine

## 2016-08-06 DIAGNOSIS — E860 Dehydration: Secondary | ICD-10-CM

## 2016-08-06 NOTE — Progress Notes (Signed)
  Subjective:    CC: Dehydration  HPI: Patient was dehydrated and requests IV placement.  Past medical history:  Negative.  See flowsheet/record as well for more information.  Surgical history: Negative.  See flowsheet/record as well for more information.  Family history: Negative.  See flowsheet/record as well for more information.  Social history: Negative.  See flowsheet/record as well for more information.  Allergies, and medications have been entered into the medical record, reviewed, and no changes needed.   Review of Systems: No fevers, chills, night sweats, weight loss, chest pain, or shortness of breath.   Objective:    General: Well Developed, well nourished, and in no acute distress.  Neuro: Alert and oriented x3, extra-ocular muscles intact, sensation grossly intact.  HEENT: Normocephalic, atraumatic, pupils equal round reactive to light, neck supple, no masses, no lymphadenopathy, thyroid nonpalpable.  Skin: Warm and dry, no rashes. Cardiac: Regular rate and rhythm, no murmurs rubs or gallops, no lower extremity edema.  Respiratory: Clear to auscultation bilaterally. Not using accessory muscles, speaking in full sentences.  20-gauge angiocatheter placed in the left cubital vein. 2 L of normal saline infused over approximately 1 hour.  Impression and Recommendations:    Dehydration 20-gauge angiocatheter into the left cubital vein and 2 L normal saline infused over 1 hour.  I spent 40 minutes with this patient, greater than 50% was face-to-face time counseling regarding the above diagnoses

## 2016-08-06 NOTE — Assessment & Plan Note (Signed)
20-gauge angiocatheter into the left cubital vein and 2 L normal saline infused over 1 hour.

## 2016-08-22 MED FILL — FLUCONAZOLE 150 MG TABLET: 150 | 2 days supply | Qty: 2 | Fill #4

## 2016-08-22 MED FILL — metroNIDAZOLE 0.75 % GEL: 0.75 | 7 days supply | Qty: 70 | Fill #4

## 2016-08-22 MED FILL — clonazePAM 0.5 MG TABS: 0.5 | 10 days supply | Qty: 20 | Fill #0

## 2016-09-03 DIAGNOSIS — H5213 Myopia, bilateral: Secondary | ICD-10-CM | POA: Diagnosis not present

## 2016-09-08 ENCOUNTER — Telehealth: Payer: Self-pay | Admitting: *Deleted

## 2016-09-08 DIAGNOSIS — Z79899 Other long term (current) drug therapy: Secondary | ICD-10-CM

## 2016-09-08 DIAGNOSIS — R5383 Other fatigue: Secondary | ICD-10-CM | POA: Diagnosis not present

## 2016-09-08 DIAGNOSIS — Z13220 Encounter for screening for lipoid disorders: Secondary | ICD-10-CM

## 2016-09-08 DIAGNOSIS — E559 Vitamin D deficiency, unspecified: Secondary | ICD-10-CM

## 2016-09-08 LAB — CBC WITH DIFFERENTIAL/PLATELET
BASOS PCT: 0 %
Basophils Absolute: 0 cells/uL (ref 0–200)
EOS ABS: 355 {cells}/uL (ref 15–500)
EOS PCT: 5 %
HCT: 40.1 % (ref 35.0–45.0)
Hemoglobin: 13.2 g/dL (ref 11.7–15.5)
Lymphocytes Relative: 29 %
Lymphs Abs: 2059 cells/uL (ref 850–3900)
MCH: 29.3 pg (ref 27.0–33.0)
MCHC: 32.9 g/dL (ref 32.0–36.0)
MCV: 88.9 fL (ref 80.0–100.0)
MONOS PCT: 6 %
MPV: 11.2 fL (ref 7.5–12.5)
Monocytes Absolute: 426 cells/uL (ref 200–950)
NEUTROS ABS: 4260 {cells}/uL (ref 1500–7800)
Neutrophils Relative %: 60 %
PLATELETS: 223 10*3/uL (ref 140–400)
RBC: 4.51 MIL/uL (ref 3.80–5.10)
RDW: 13.1 % (ref 11.0–15.0)
WBC: 7.1 10*3/uL (ref 3.8–10.8)

## 2016-09-08 LAB — LIPID PANEL
CHOL/HDL RATIO: 2.6 ratio (ref <5.0)
CHOLESTEROL: 188 mg/dL (ref <200)
HDL: 71 mg/dL (ref >50)
LDL Cholesterol: 111 mg/dL — ABNORMAL HIGH
Triglycerides: 30 mg/dL (ref <150)
VLDL: 6 mg/dL (ref <30)

## 2016-09-08 LAB — COMPLETE METABOLIC PANEL WITH GFR
ALBUMIN: 4.2 g/dL (ref 3.6–5.1)
ALK PHOS: 40 U/L (ref 33–115)
ALT: 16 U/L (ref 6–29)
AST: 17 U/L (ref 10–30)
BILIRUBIN TOTAL: 0.4 mg/dL (ref 0.2–1.2)
BUN: 8 mg/dL (ref 7–25)
CALCIUM: 9.1 mg/dL (ref 8.6–10.2)
CO2: 25 mmol/L (ref 20–31)
Chloride: 104 mmol/L (ref 98–110)
Creat: 0.69 mg/dL (ref 0.50–1.10)
GFR, Est African American: >89 mL/min (ref >=60)
Glucose, Bld: 80 mg/dL (ref 65–99)
POTASSIUM: 3.7 mmol/L (ref 3.5–5.3)
Sodium: 138 mmol/L (ref 135–146)
Total Protein: 6.6 g/dL (ref 6.1–8.1)

## 2016-09-08 LAB — VITAMIN B12: VITAMIN B 12: 615 pg/mL (ref 200–1100)

## 2016-09-08 LAB — TSH: TSH: 1.01 m[IU]/L

## 2016-09-08 LAB — FERRITIN: Ferritin: 17 ng/mL (ref 10–154)

## 2016-09-08 NOTE — Telephone Encounter (Signed)
Labs ordered for upcoming CPE. 

## 2016-09-09 LAB — VITAMIN D 25 HYDROXY (VIT D DEFICIENCY, FRACTURES): Vit D, 25-Hydroxy: 23 ng/mL — ABNORMAL LOW (ref 30–100)

## 2016-09-10 ENCOUNTER — Ambulatory Visit (INDEPENDENT_AMBULATORY_CARE_PROVIDER_SITE_OTHER): Payer: 59 | Admitting: Physician Assistant

## 2016-09-10 ENCOUNTER — Encounter: Payer: Self-pay | Admitting: Physician Assistant

## 2016-09-10 VITALS — BP 116/67 | HR 96 | Ht 59.0 in | Wt 115.0 lb

## 2016-09-10 DIAGNOSIS — F900 Attention-deficit hyperactivity disorder, predominantly inattentive type: Secondary | ICD-10-CM

## 2016-09-10 DIAGNOSIS — R202 Paresthesia of skin: Secondary | ICD-10-CM

## 2016-09-10 DIAGNOSIS — Z0000 Encounter for general adult medical examination without abnormal findings: Secondary | ICD-10-CM | POA: Diagnosis not present

## 2016-09-10 DIAGNOSIS — F332 Major depressive disorder, recurrent severe without psychotic features: Secondary | ICD-10-CM

## 2016-09-10 MED ORDER — LISDEXAMFETAMINE DIMESYLATE 70 MG PO CAPS: 70.0000 mg | ORAL_CAPSULE | Freq: Every day | ORAL | 0 refills | Status: DC

## 2016-09-10 NOTE — Patient Instructions (Signed)

## 2016-09-10 NOTE — Progress Notes (Signed)
Subjective:     Victoria Barnes is a 30 y.o. female and is here for a comprehensive physical exam. The patient reports problems - numbness and tingling of both hands and feet. symptoms off and on. she has been doing a lot of new things like sanding furniture. she is on her feet a lot due to moving into a new house. she is wondering if could be trintellix since only new medication. .  Social History   Social History  . Marital status: Single    Spouse name: N/A  . Number of children: N/A  . Years of education: N/A   Occupational History  . CMA    Social History Main Topics  . Smoking status: Current Some Day Smoker  . Smokeless tobacco: Not on file     Comment: Smoking 1 cig/month  . Alcohol use 2.4 oz/week    4 Standard drinks or equivalent per week  . Drug use: No  . Sexual activity: Yes    Partners: Male    Birth control/ protection: Condom     Comment: condoms   Other Topics Concern  . Not on file   Social History Narrative  . No narrative on file   Health Maintenance  Topic Date Due  . INFLUENZA VACCINE  06/03/2016  . PAP SMEAR  09/07/2017  . TETANUS/TDAP  09/03/2024  . HIV Screening  Completed    The following portions of the patient's history were reviewed and updated as appropriate: allergies, current medications, past family history, past medical history, past social history, past surgical history and problem list.  Review of Systems Pertinent items noted in HPI and remainder of comprehensive ROS otherwise negative.   Objective:    BP 116/67   Pulse 96   Ht 4\' 11"  (1.499 m)   Wt 115 lb (52.2 kg)   BMI 23.23 kg/m  General appearance: alert, cooperative and appears stated age Head: Normocephalic, without obvious abnormality, atraumatic Eyes: conjunctivae/corneas clear. PERRL, EOM's intact. Fundi benign. Ears: normal TM's and external ear canals both ears Nose: Nares normal. Septum midline. Mucosa normal. No drainage or sinus tenderness. Throat: lips,  mucosa, and tongue normal; teeth and gums normal Neck: no adenopathy, no carotid bruit, no JVD, supple, symmetrical, trachea midline and thyroid not enlarged, symmetric, no tenderness/mass/nodules Back: symmetric, no curvature. ROM normal. No CVA tenderness. Lungs: clear to auscultation bilaterally Heart: regular rate and rhythm, S1, S2 normal, no murmur, click, rub or gallop Abdomen: soft, non-tender; bowel sounds normal; no masses,  no organomegaly Extremities: extremities normal, atraumatic, no cyanosis or edema Pulses: 2+ and symmetric Skin: Skin color, texture, turgor normal. No rashes or lesions Lymph nodes: Cervical, supraclavicular, and axillary nodes normal. Neurologic: Alert and oriented X 3, normal strength and tone. Normal symmetric reflexes. Normal coordination and gait    Assessment:    Healthy female exam.      Plan:  [name].[name].[name] was seen today for annual exam.  Diagnoses and all orders for this visit:  Routine physical examination  Attention deficit hyperactivity disorder (ADHD), predominantly inattentive type -     Discontinue: lisdexamfetamine (VYVANSE) 70 MG capsule; Take 1 capsule (70 mg total) by mouth daily. -     Discontinue: lisdexamfetamine (VYVANSE) 70 MG capsule; Take 1 capsule (70 mg total) by mouth daily. Do not fill until 10/09/16. -     lisdexamfetamine (VYVANSE) 70 MG capsule; Take 1 capsule (70 mg total) by mouth daily. Do not fill until 11/08/16.  Severe recurrent major depression without  psychotic features [facility](HCC)     discussed labs that were drawn.  Iron stores a little low. Discussed supplementation.  Vitamin d a little low discussed 1000 units daily with calcium 1500mg .  Encouraged 150 minutes of exercise a week.   Discussed paresthesias could be some due to new activities encouraged ice and NSAIDs. Can consider in next few months decreasing trintellix to 15mg  daily.   Follow up in 3 months.  See After Visit Summary for Counseling Recommendations

## 2016-09-11 ENCOUNTER — Ambulatory Visit (INDEPENDENT_AMBULATORY_CARE_PROVIDER_SITE_OTHER): Payer: 59 | Admitting: Sports Medicine

## 2016-09-11 VITALS — BP 119/80 | HR 85

## 2016-09-11 DIAGNOSIS — M542 Cervicalgia: Secondary | ICD-10-CM | POA: Diagnosis not present

## 2016-09-11 MED ORDER — KETOROLAC TROMETHAMINE 30 MG/ML IJ SOLN
30.0000 mg | Freq: Once | INTRAMUSCULAR | Status: AC
  Administered 2016-09-11: 30 mg via INTRAMUSCULAR

## 2016-09-11 NOTE — Progress Notes (Signed)
Patient was seen in clinic today for Toradol injection due to neck pain. Pt reports the neck pain is mostly on her left side and it has caused her to have a headache. Upon review with a Provider in office, Toradol 30mg  IM was ordered. Pt tolerated injection in left deltoid well, no immediate complications.

## 2016-09-12 ENCOUNTER — Other Ambulatory Visit: Payer: Self-pay | Admitting: *Deleted

## 2016-09-12 MED ORDER — VITAMIN D (ERGOCALCIFEROL) 1.25 MG (50000 UNIT) PO CAPS
50000.0000 [IU] | ORAL_CAPSULE | ORAL | 0 refills | Status: DC
Start: 2016-09-12 — End: 2017-01-12

## 2016-09-24 MED FILL — SF 5000 PLUS CREAM: 1.1 | 30 days supply | Qty: 51 | Fill #0

## 2016-09-24 MED FILL — RIZATRIPTAN 10 MG TABLET: 10 | 30 days supply | Qty: 9 | Fill #0

## 2016-09-24 MED FILL — VIT D2 1.25 MG (50,000 UNIT: 1.25 MG | 84 days supply | Qty: 12 | Fill #0

## 2016-09-24 MED FILL — traZODone HCL 50 MG TABS: 50 | 30 days supply | Qty: 30 | Fill #0

## 2016-09-24 MED FILL — FLUCONAZOLE 100 MG TABLET: 100 | 3 days supply | Qty: 2 | Fill #0

## 2016-09-24 MED FILL — CHLORHEXIDINE 0.12% RINSE: 0.12 | 16 days supply | Qty: 473 | Fill #0

## 2016-10-13 ENCOUNTER — Other Ambulatory Visit: Payer: Self-pay | Admitting: *Deleted

## 2016-10-13 MED ORDER — VORTIOXETINE HBR 10 MG PO TABS: 1.0000 | ORAL_TABLET | Freq: Every day | ORAL | 1 refills | Status: DC

## 2016-10-13 MED FILL — VYVANSE 70 MG CAPSULE: 70 | 30 days supply | Qty: 30 | Fill #0

## 2016-10-13 MED FILL — TRINTELLIX 10 MG TABLET: 10 | 90 days supply | Qty: 90 | Fill #0

## 2016-10-23 ENCOUNTER — Ambulatory Visit (INDEPENDENT_AMBULATORY_CARE_PROVIDER_SITE_OTHER): Payer: 59 | Admitting: Sports Medicine

## 2016-10-23 DIAGNOSIS — G43909 Migraine, unspecified, not intractable, without status migrainosus: Secondary | ICD-10-CM | POA: Diagnosis not present

## 2016-10-23 MED ORDER — RIZATRIPTAN BENZOATE 5 MG PO TBDP: 5.0000 mg | ORAL_TABLET | ORAL | 3 refills | Status: DC | PRN

## 2016-10-23 MED ORDER — DEXAMETHASONE SODIUM PHOSPHATE 4 MG/ML IJ SOLN
4.0000 mg | Freq: Once | INTRAMUSCULAR | Status: AC
  Administered 2016-10-23: 4 mg via INTRAMUSCULAR

## 2016-10-23 MED ORDER — KETOROLAC TROMETHAMINE 30 MG/ML IJ SOLN
30.0000 mg | Freq: Once | INTRAMUSCULAR | Status: AC
  Administered 2016-10-23: 30 mg via INTRAMUSCULAR

## 2016-10-23 MED ORDER — TOPIRAMATE 25 MG PO TABS: ORAL_TABLET | ORAL | 3 refills | Status: DC

## 2016-10-23 MED FILL — TOPIRAMATE 25 MG TABLET: 25 | 33 days supply | Qty: 30 | Fill #0

## 2016-10-23 MED FILL — RIZATRIPTAN 5 MG ODT: 5 | 30 days supply | Qty: 9 | Fill #0

## 2016-10-23 NOTE — Addendum Note (Signed)
Addended by: Baird KayUGLAS, Kyrollos Cordell M on: 10/23/2016 04:38 PM   Modules accepted: Orders

## 2016-10-23 NOTE — Assessment & Plan Note (Signed)
Persistent migraines, this time complex with right upper extremity paresthesias. Maxalt 10 is too high, dropping to 5 mg. Adding Topamax low-dose, IV fluids today, Decadron, Toradol. Keep a migraine diary. Considering focal neurologic symptoms with her headaches we do need to get a brain MRI with and without IV contrast.

## 2016-10-23 NOTE — Progress Notes (Signed)
  Subjective:    CC: headache  HPI: Victoria Barnes is a 30 year old female, she has a history of migraines, mood disorder currently on Trintellix, she recently decreased her dose from 20 mg to 10 mg.  Since then she's had more headaches, right-sided, throbbing, with photophobia, phonophobia, not much nausea this time. Typically she gets IV fluids, she has avoided use of her Maxalt because she feels altered mental status when she uses it, she has 10 mg pills. She does still somewhat confused today, and does get paresthesias into her right upper extremity 1 headaches come.  Past medical history:  Negative.  See flowsheet/record as well for more information.  Surgical history: Negative.  See flowsheet/record as well for more information.  Family history: Negative.  See flowsheet/record as well for more information.  Social history: Negative.  See flowsheet/record as well for more information.  Allergies, and medications have been entered into the medical record, reviewed, and no changes needed.   Review of Systems: No fevers, chills, night sweats, weight loss, chest pain, or shortness of breath.   Objective:    General: Well Developed, well nourished, and in no acute distress. Mildly tearful in the exam room. Neuro: Alert and oriented x3, extra-ocular muscles intact, sensation grossly intact. Cranial nerves II through XII are intact, motor, sensory, coordinative functions are all intact. HEENT: Normocephalic, atraumatic, pupils equal round reactive to light, neck supple, no masses, no lymphadenopathy, thyroid nonpalpable.  Skin: Warm and dry, no rashes. Cardiac: Regular rate and rhythm, no murmurs rubs or gallops, no lower extremity edema.  Respiratory: Clear to auscultation bilaterally. Not using accessory muscles, speaking in full sentences.  20-gauge angiocatheter placed in the right cubital vein by Gena Frayharley Cummings, PA-C, we then ran 2 L normal saline.  Impression and Recommendations:    Migraine  headache Persistent migraines, this time complex with right upper extremity paresthesias. Maxalt 10 is too high, dropping to 5 mg. Adding Topamax low-dose, IV fluids today, Decadron, Toradol. Keep a migraine diary. Considering focal neurologic symptoms with her headaches we do need to get a brain MRI with and without IV contrast.  I spent 40 minutes with this patient, greater than 50% was face-to-face time counseling regarding the above diagnoses

## 2016-10-28 ENCOUNTER — Other Ambulatory Visit: Payer: Self-pay

## 2016-11-10 ENCOUNTER — Ambulatory Visit (INDEPENDENT_AMBULATORY_CARE_PROVIDER_SITE_OTHER): Payer: 59 | Admitting: Physician Assistant

## 2016-11-10 ENCOUNTER — Encounter: Payer: Self-pay | Admitting: Physician Assistant

## 2016-11-10 VITALS — BP 96/63 | HR 99 | Ht 59.0 in | Wt 111.0 lb

## 2016-11-10 DIAGNOSIS — G43909 Migraine, unspecified, not intractable, without status migrainosus: Secondary | ICD-10-CM

## 2016-11-10 DIAGNOSIS — F39 Unspecified mood [affective] disorder: Secondary | ICD-10-CM

## 2016-11-10 DIAGNOSIS — F900 Attention-deficit hyperactivity disorder, predominantly inattentive type: Secondary | ICD-10-CM

## 2016-11-10 DIAGNOSIS — T887XXA Unspecified adverse effect of drug or medicament, initial encounter: Secondary | ICD-10-CM

## 2016-11-10 DIAGNOSIS — T50905A Adverse effect of unspecified drugs, medicaments and biological substances, initial encounter: Secondary | ICD-10-CM

## 2016-11-10 DIAGNOSIS — F332 Major depressive disorder, recurrent severe without psychotic features: Secondary | ICD-10-CM

## 2016-11-10 NOTE — Progress Notes (Signed)
   Subjective:    Patient ID: Victoria Barnes, female    DOB: 08-Nov-1985, 31 y.o.   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 She had to stop trintellix due to bilateral hand swelling that resolved once stopped medication. She was previously on prozac but she felt her migraines worsened on prozac. She has tried lexapro without benefit. She is not sleeping. When takes trazodone she sleeps 2-3 hours at night. She "doesn't really feel like she needs that much sleep". She has no desire to eat. She hates being on medication because it "numbs her and makes her not care". She likes being off because she "feels like herself" is aggressive and passionate. The problem is she cries and feels sad all the time as well. She admits to spending money she doesn't have and at times making bad sexual decisions. She has a family hx of bipolar and multiple personality disorder. She does feel like she has a lot of work related stress that is making all her symptoms worse.   Migraine- did not get MRI due to having metal in body.   ADHD- on vyvanse needs refill.   Review of Systems  All other systems reviewed and are negative.      Objective:   Physical Exam  Constitutional: She is oriented to person, place, and time. She appears well-developed and well-nourished.  HENT:  Head: Normocephalic and atraumatic.  Eyes: Conjunctivae are normal.  Neck: Normal range of motion. Neck supple.  Cardiovascular: Normal rate, regular rhythm and normal heart sounds.   Pulmonary/Chest: Effort normal and breath sounds normal.  Lymphadenopathy:    She has no cervical adenopathy.  Neurological: She is alert and oriented to person, place, and time.  Psychiatric:  Flat affect and tearful.           Assessment & Plan:  [name].[name].[name] was seen today for depression and anxiety.  Diagnoses and all orders for this visit:  Migraine without status migrainosus, not  intractable, unspecified migraine type  Attention deficit hyperactivity disorder (ADHD), predominantly inattentive type -     lisdexamfetamine (VYVANSE) 70 MG capsule; Take 1 capsule (70 mg total) by mouth daily.  Severe recurrent major depression without psychotic features (HCC) -     Ambulatory referral to Psychiatry  Medication reaction, initial encounter  Episodic mood disorder (HCC) -     Ambulatory referral to Psychiatry  concerned for some bipolar overlap with depression. Will send to psychiatry.  Ok to stop trintellix. Will consider medication after evaluation. She defers any mood stabilizer today.  Encouraged patient to get back in with counseling regularly.   If decide on medication consider cymbalta, effexor they are on her ok metabolism list in chart that she had done. See Media.   Spent 30 minutes with patient and greater than 50 percent of visit spent counseling patient regarding treatment plan.

## 2016-11-11 ENCOUNTER — Encounter: Payer: Self-pay | Admitting: Physician Assistant

## 2016-11-11 ENCOUNTER — Other Ambulatory Visit: Payer: Self-pay | Admitting: *Deleted

## 2016-11-11 DIAGNOSIS — F39 Unspecified mood [affective] disorder: Secondary | ICD-10-CM | POA: Insufficient documentation

## 2016-11-11 MED ORDER — FLUOCINONIDE 0.05 % EX GEL: 1.0000 "application " | Freq: Four times a day (QID) | CUTANEOUS | 4 refills | Status: DC

## 2016-11-11 MED ORDER — LISDEXAMFETAMINE DIMESYLATE 70 MG PO CAPS: 70.0000 mg | ORAL_CAPSULE | Freq: Every day | ORAL | 0 refills | Status: DC

## 2016-11-11 MED ORDER — FEXOFENADINE HCL 180 MG PO TABS: 180.0000 mg | ORAL_TABLET | Freq: Every day | ORAL | 3 refills | Status: DC

## 2016-11-11 MED ORDER — CLOBETASOL PROP EMOLLIENT BASE 0.05 % EX CREA: TOPICAL_CREAM | CUTANEOUS | 6 refills | Status: DC

## 2016-11-11 MED ORDER — TACROLIMUS 0.1 % EX OINT: TOPICAL_OINTMENT | Freq: Two times a day (BID) | CUTANEOUS | 1 refills | Status: DC

## 2016-11-17 MED FILL — VYVANSE 70 MG CAPSULE: 70 | 30 days supply | Qty: 30 | Fill #0

## 2016-11-17 MED FILL — FLUOCINONIDE 0.05% GEL: 0.05 | 14 days supply | Qty: 60 | Fill #0

## 2016-11-24 ENCOUNTER — Telehealth: Payer: Self-pay | Admitting: *Deleted

## 2016-11-24 DIAGNOSIS — F4323 Adjustment disorder with mixed anxiety and depressed mood: Secondary | ICD-10-CM

## 2016-11-24 DIAGNOSIS — F39 Unspecified mood [affective] disorder: Secondary | ICD-10-CM

## 2016-11-24 DIAGNOSIS — F332 Major depressive disorder, recurrent severe without psychotic features: Secondary | ICD-10-CM

## 2016-11-24 NOTE — Telephone Encounter (Signed)
New referral placed per pt request  

## 2016-12-10 ENCOUNTER — Ambulatory Visit (HOSPITAL_COMMUNITY): Payer: Self-pay | Admitting: Psychiatry

## 2016-12-10 MED ORDER — AMPHETAMINE-DEXTROAMPHETAMINE 30 MG PO TABS: 30.0000 mg | ORAL_TABLET | Freq: Two times a day (BID) | ORAL | 0 refills | Status: DC

## 2016-12-10 NOTE — Telephone Encounter (Signed)
Pt would like to try adderall immediate release instead of vyvanse. She is having irritability when vyvanse wears off. Discussed could make irritability worse.

## 2016-12-10 NOTE — Telephone Encounter (Signed)
Could make irritability worse due to being immediate release the "fall off" that can happen. Will try for one month and see.

## 2016-12-10 NOTE — Telephone Encounter (Signed)
Pt would like to try immediate release Adderall instead of Vyvanse.  Pt complains of irritability when the Vyvanse wears off & wants to see if that would be better on a different medication.  Please advise.

## 2016-12-11 ENCOUNTER — Ambulatory Visit (INDEPENDENT_AMBULATORY_CARE_PROVIDER_SITE_OTHER): Payer: 59 | Admitting: Sports Medicine

## 2016-12-11 DIAGNOSIS — R69 Illness, unspecified: Secondary | ICD-10-CM

## 2016-12-11 DIAGNOSIS — J111 Influenza due to unidentified influenza virus with other respiratory manifestations: Secondary | ICD-10-CM | POA: Insufficient documentation

## 2016-12-11 MED ORDER — OSELTAMIVIR PHOSPHATE 75 MG PO CAPS: 75.0000 mg | ORAL_CAPSULE | Freq: Two times a day (BID) | ORAL | 0 refills | Status: DC

## 2016-12-11 MED FILL — DEXTROAMP-AMP 30 MG TABLET: 30 | 30 days supply | Qty: 60 | Fill #0

## 2016-12-11 NOTE — Progress Notes (Signed)
  Subjective:    CC: Not feeling well  HPI: For the past day this 31 year old female has felt muscle aches, body aches, sore throat, fogginess. She desires intravenous fluids today. 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  Past medical history:  Negative.  See flowsheet/record as well for more information.  Surgical history: Negative.  See flowsheet/record as well for more information.  Family history: Negative.  See flowsheet/record as well for more information.  Social history: Negative.  See flowsheet/record as well for more information.  Allergies, and medications have been entered into the medical record, reviewed, and no changes needed.   Review of Systems: No fevers, chills, night sweats, weight loss, chest pain, or shortness of breath.   Objective:    General: Well Developed, well nourished, and in no acute distress.  Neuro: Alert and oriented x3, extra-ocular muscles intact, sensation grossly intact.  HEENT: Normocephalic, atraumatic, pupils equal round reactive to light, neck supple, no masses, no lymphadenopathy, thyroid nonpalpable.  Skin: Warm and dry, no rashes. Cardiac: Regular rate and rhythm, no murmurs rubs or gallops, no lower extremity edema.  Respiratory: Clear to auscultation bilaterally. Not using accessory muscles, speaking in full sentences.  20-gauge angiocatheter placed in the left cubital vein, 2 L normal saline infused.  Impression and Recommendations:    Influenza-like illness 1 L normal saline infused. Tamiflu.  I spent 40 minutes with this patient, greater than 50% was face-to-face time counseling regarding the above diagnoses

## 2016-12-11 NOTE — Assessment & Plan Note (Signed)
1 L normal saline infused. Tamiflu.

## 2017-01-12 ENCOUNTER — Other Ambulatory Visit: Payer: Self-pay

## 2017-01-12 DIAGNOSIS — F418 Other specified anxiety disorders: Secondary | ICD-10-CM

## 2017-01-12 MED ORDER — CLONAZEPAM 0.5 MG PO TABS: 0.5000 mg | ORAL_TABLET | Freq: Two times a day (BID) | ORAL | 1 refills | Status: DC | PRN

## 2017-01-12 MED ORDER — AMPHETAMINE-DEXTROAMPHETAMINE 30 MG PO TABS: 30.0000 mg | ORAL_TABLET | Freq: Two times a day (BID) | ORAL | 0 refills | Status: DC

## 2017-01-12 MED ORDER — VITAMIN D (ERGOCALCIFEROL) 1.25 MG (50000 UNIT) PO CAPS: 50000.0000 [IU] | ORAL_CAPSULE | ORAL | 0 refills | Status: DC

## 2017-01-12 MED ORDER — METRONIDAZOLE 0.75 % VA GEL: 1.0000 | Freq: Two times a day (BID) | VAGINAL | 4 refills | Status: DC

## 2017-01-12 MED FILL — metroNIDAZOLE 0.75 % GEL: 0.75 | 10 days supply | Qty: 70 | Fill #0 | Status: TO

## 2017-01-12 MED FILL — VIT D2 1.25 MG (50,000 UNIT: 1.25 MG | 84 days supply | Qty: 12 | Fill #0

## 2017-01-13 MED FILL — DEXTROAMP-AMP 30 MG TABLET: 30 | 30 days supply | Qty: 60 | Fill #0

## 2017-01-13 MED FILL — clonazePAM 0.5 MG TABS: 0.5 | 10 days supply | Qty: 20 | Fill #0

## 2017-01-16 ENCOUNTER — Telehealth: Payer: Self-pay | Admitting: Physician Assistant

## 2017-01-16 MED ORDER — CIPROFLOXACIN HCL 500 MG PO TABS: 500.0000 mg | ORAL_TABLET | Freq: Two times a day (BID) | ORAL | 0 refills | Status: DC

## 2017-01-16 MED FILL — CIPROFLOXACIN HCL 500 MG TA: 500 | 3 days supply | Qty: 6 | Fill #0

## 2017-01-16 NOTE — Telephone Encounter (Signed)
Pt UA dipstick in office. Moderate leukocytes, small blood. Pt has 2 days of increased urination and dysuria.   Treated with cipro for 3 days. Follow up as needed.

## 2017-01-26 ENCOUNTER — Encounter: Payer: Self-pay | Admitting: Physician Assistant

## 2017-01-26 ENCOUNTER — Ambulatory Visit (INDEPENDENT_AMBULATORY_CARE_PROVIDER_SITE_OTHER): Payer: 59 | Admitting: Physician Assistant

## 2017-01-26 VITALS — BP 107/69 | HR 99 | Ht 59.0 in | Wt 113.0 lb

## 2017-01-26 DIAGNOSIS — Z6791 Unspecified blood type, Rh negative: Secondary | ICD-10-CM | POA: Diagnosis not present

## 2017-01-26 DIAGNOSIS — G43009 Migraine without aura, not intractable, without status migrainosus: Secondary | ICD-10-CM | POA: Diagnosis not present

## 2017-01-26 DIAGNOSIS — Z3009 Encounter for other general counseling and advice on contraception: Secondary | ICD-10-CM | POA: Diagnosis not present

## 2017-01-26 MED ORDER — KETOROLAC TROMETHAMINE 60 MG/2ML IM SOLN
60.0000 mg | Freq: Once | INTRAMUSCULAR | Status: AC
  Administered 2017-01-26: 60 mg via INTRAMUSCULAR

## 2017-01-26 MED ORDER — DEXAMETHASONE SODIUM PHOSPHATE 4 MG/ML IJ SOLN
4.0000 mg | Freq: Once | INTRAMUSCULAR | Status: AC
  Administered 2017-01-26: 4 mg via INTRAMUSCULAR

## 2017-01-26 MED ORDER — FROVATRIPTAN SUCCINATE 2.5 MG PO TABS: 2.5000 mg | ORAL_TABLET | ORAL | 0 refills | Status: DC | PRN

## 2017-01-26 NOTE — Progress Notes (Addendum)
   Subjective:    Patient ID: Victoria Barnes, female    DOB: 04-Dec-1985, 31 y.o.   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 She is averaging one migraine a month but they have been lasting more than one day. She denies any aura. She took maxalt on Friday night but has not helped. She took goody's powder Saturday and Sunday with little relief. She is nauseated but no vomiting. She is noticing that they seem to happen when she should be on her period but has skyla IUD. She has had skyla since August 2016.   She does question if she were to get pregnant again she is RH negative and she got rhogam with first child but did not get shot with miscarriage and wonders if there is anything she should know. She is considering pregnancy.    Review of Systems  All other systems reviewed and are negative.      Objective:   Physical Exam  Constitutional: She is oriented to person, place, and time. She appears well-developed and well-nourished.  HENT:  Head: Normocephalic and atraumatic.  Eyes: Conjunctivae are normal.  Cardiovascular: Normal rate, regular rhythm and normal heart sounds.   Pulmonary/Chest: Effort normal and breath sounds normal.  Neurological: She is alert and oriented to person, place, and time.  Psychiatric: She has a normal mood and affect. Her behavior is normal.          Assessment & Plan:  Victoria Kitchen.Victoria Kitchen.Karel Barnes was seen today for migraine.  Diagnoses and all orders for this visit:  Migraine without aura and without status migrainosus, not intractable -     frovatriptan (FROVA) 2.5 MG tablet; Take 1 tablet (2.5 mg total) by mouth as needed for migraine. If recurs, may repeat after 2 hours. Max of 3 tabs in 24 hours. -     ketorolac (TORADOL) injection 60 mg; Inject 2 mLs (60 mg total) into the muscle once. -     dexamethasone (DECADRON) injection 4 mg; Inject 1 mL (4 mg total) into the muscle  once.  Unspecified blood type, rh negative  Family planning counseling   Consider replacing Maxalt with frova since migraines seem to be more around what would be her menstrual cycle. Not occurring encough at this point to consider preventative and patient does not want to be on any daily medications. Follow up as needed.   Will send note to GYN to see about RH question. GYN replied she needed to test antibodies against RH to determine if she needs referral for high risk pregnancy. Will order test.

## 2017-01-27 ENCOUNTER — Encounter: Payer: Self-pay | Admitting: Physician Assistant

## 2017-01-28 ENCOUNTER — Telehealth: Payer: Self-pay | Admitting: *Deleted

## 2017-01-28 ENCOUNTER — Other Ambulatory Visit: Payer: Self-pay | Admitting: Physician Assistant

## 2017-01-28 DIAGNOSIS — Z6791 Unspecified blood type, Rh negative: Secondary | ICD-10-CM | POA: Diagnosis not present

## 2017-01-28 DIAGNOSIS — Z3009 Encounter for other general counseling and advice on contraception: Secondary | ICD-10-CM | POA: Diagnosis not present

## 2017-01-28 NOTE — Addendum Note (Signed)
Addended by: Juel BurrowBENDER, Kendale Rembold L on: 01/28/2017 04:17 PM   Modules accepted: Orders

## 2017-01-29 LAB — RH TYPE: RH TYPE: NEGATIVE

## 2017-01-30 ENCOUNTER — Encounter (HOSPITAL_COMMUNITY): Payer: Self-pay | Admitting: Psychiatry

## 2017-01-30 ENCOUNTER — Ambulatory Visit (INDEPENDENT_AMBULATORY_CARE_PROVIDER_SITE_OTHER): Payer: 59 | Admitting: Psychiatry

## 2017-01-30 VITALS — BP 106/64 | HR 86 | Ht 59.0 in | Wt 112.8 lb

## 2017-01-30 DIAGNOSIS — F319 Bipolar disorder, unspecified: Secondary | ICD-10-CM | POA: Diagnosis not present

## 2017-01-30 DIAGNOSIS — Z880 Allergy status to penicillin: Secondary | ICD-10-CM

## 2017-01-30 DIAGNOSIS — Z888 Allergy status to other drugs, medicaments and biological substances status: Secondary | ICD-10-CM | POA: Diagnosis not present

## 2017-01-30 DIAGNOSIS — Z810 Family history of intellectual disabilities: Secondary | ICD-10-CM | POA: Diagnosis not present

## 2017-01-30 DIAGNOSIS — Z79899 Other long term (current) drug therapy: Secondary | ICD-10-CM | POA: Diagnosis not present

## 2017-01-30 DIAGNOSIS — Z818 Family history of other mental and behavioral disorders: Secondary | ICD-10-CM | POA: Diagnosis not present

## 2017-01-30 DIAGNOSIS — Z87891 Personal history of nicotine dependence: Secondary | ICD-10-CM | POA: Diagnosis not present

## 2017-01-30 LAB — ANTIBODY SCREEN: ANTIBODY SCREEN: NEGATIVE

## 2017-01-30 MED ORDER — ARIPIPRAZOLE 5 MG PO TABS: 5.0000 mg | ORAL_TABLET | Freq: Every day | ORAL | 0 refills | Status: DC

## 2017-01-30 MED FILL — ARIPiprazole 5 MG TABS: 5 | 90 days supply | Qty: 90 | Fill #0

## 2017-01-30 NOTE — Progress Notes (Signed)
Psychiatric Initial Adult Assessment   Patient Identification: Victoria Barnes MRN:  [id number] Date of Evaluation:  01/30/2017 Referral Source: self, pcp Chief Complaint:  Anxiety, adhd, depression Chief Complaint    Depression; Anxiety     Visit Diagnosis:    ICD-9-CM ICD-10-CM   1. Bipolar I disorder (HCC) 296.7 F31.9     History of Present Illness:  Victoria Barnes is a 31 year old female With a psychiatric history of ADHD, postpartum depression, anxiety, and suicidal thoughts who was previously involved in the partial hospitalization program in this clinic. She reports that she has generally been avoidant of receiving psychiatric care in the past, and part of this is her family culture. She suspects that her mom has some sort of mental health issues, but reports that mom is never received help.  The patient reports that things started last year when she had this episode for about 2 weeks where she was very angry, irritable, wasn't sleeping more than 2 or 3 hours at night, was behaving and sexually promiscuous ways and cheating on her boyfriend, decided at random to take out a mortgage to buy a new house (that she currently lives in with her mother), was having significant frustration and conflict in interpersonal interactions with others, even describing arguing with people in grocery stores about minute issues, and was eventually referred to the partial hospitalization program. She shares that she was able to think about her issues with anger, and felt like she was doing okay after she participated in the partial hospitalization program. She reports that went to see somebody for ADHD assessment, and was diagnosed and started on an ADHD medicine. She felt like the ADHD medicine contributed to some irritability and hyper focus, but also seems to calm her down, so she decided to switch it to an immediate release with her primary care physician.  She reports that over the past 6 months or so, she  has continued to struggle with cyclical moods, whereby she will be depressed and sad for 2-3 month periods, then will be okay, relatively speaking, but then be more angry, irritable, sleeping is less, spending more money and impulse shopping, and being sexually promiscuous. She reports that currently she feels like she is going through that sort of a period, and reports that she sleeping only 3 or 4 hours per night. She reports that she feels depressed, but she knows that she comes across as being "fine". She reports that she feels like the world is a terrible place, full of terrible people, and specifically talks about how the president is a moron, senate doesn't take care of its people, black people are targeted by police, and this makes her feel like there is no point to even trying things in life. She feels that globally the world is a terrible place, but she denies any plans or intention to hurt her self. She reports that she loves her 12 year old son, and enjoys him very much, and wants to be around to continue to be there for him.  She presents with a characteristic cheerful depression.   I spent time educating the patient about bipolar spectrum of illness even reviewed the criteria.  The patient expressed grief, but agreed that this characterizes her experiences quite accurately. In addition she shares her history of postpartum depression about 3 months after her son was born, whereby she decided she did not want to have him anymore, and her mother sent her away for 1-2 weeks on a vacation  so that she could clear her head. She reports that she remembers feeling that having a child was a mistake, that she was not fit to be a mother, and remembers feeling very defeated and depressed.  She denies any substance use. She drinks alcohol sparingly. I spent time reviewing the risks and benefits of Abilify. She agrees to initiate a low dose and titrate per our discussion. I educated patient on the comorbidity of  bipolar disorder and ADHD, but that for now I'd like her to stop her stimulant. She was agreeable to doing that. She reports trazodone 25 mg at night is generally effective for her sleep when she does take it, so I encouraged the patient to take this every night regularly for her sleep.  She agrees to follow-up with Clinical research associate in 2 weeks.  Fill Date Product, Str, Form Qty Days Pt ID Prescriber Written RX# N/R* Pharm **MED+ ---------- -------------------------------- ------ ---- --------- ---------- ---------- ------------ ----- --------- ------ 01/13/2017 CLONAZEPAM 0.5 MG TABLET 20.00 10 [id number] 01/12/2017 [id number] N [id number] 00.0 01/13/2017 DEXTROAMP-AMPHETAMIN 30 MG TAB 60.00 30 [id number] 01/12/2017 [id number] N [id number] 00.0 12/11/2016 DEXTROAMP-AMPHETAMIN 30 MG TAB 60.00 30 [id number] 12/10/2016 [id number] N [id number] 00.0 11/17/2016 VYVANSE 70 MG CAPSULE 30.00 30 [id number] 11/11/2016 [id number] N [id number] 00.0 10/13/2016 VYVANSE 70 MG CAPSULE 30.00 30 [id number] 09/10/2016 [id number] N [id number] 00.0 09/13/2016 VYVANSE 70 MG CAPSULE 30.00 30 [id number] 09/10/2016 [id number] N [id number] 00.0 08/22/2016 CLONAZEPAM 0.5 MG TABLET 20.00 10 [id number] 05/05/2016 [id number] N [id number] 00.0 08/04/2016 VYVANSE 70 MG CAPSULE 30.00 30 [id number] 08/04/2016 [id number] N [id number] 00.0 07/04/2016 VYVANSE 70 MG CAPSULE 30.00 30 [id number] 06/25/2016 [id number] N [id number] 00.0 06/05/2016 VYVANSE 70 MG CAPSULE 30.00 30 [id number] 05/22/2016 [id number] N [id number] 00.0 05/05/2016 CLONAZEPAM 0.5 MG TABLET 20.00 10 [id number] 05/05/2016 [id number] N [id number] 00.0 05/01/2016 VYVANSE 70 MG CAPSULE 30.00 30 [id number] 05/01/2016 [id number] N [id number] 00.0 04/14/2016 ACETAMINOPHEN-COD #3 TABLET 8.00 2 [id number] 04/14/2016 [id number] N [id number] 18.0 04/01/2016 VYVANSE 70 MG CAPSULE 30.00 30 [id number] 03/28/2016 [id number] N [id number]  00.0 02/28/2016 VYVANSE 70 MG CAPSULE 30.00 30 [id number] 02/28/2016 [id number] N [id number] 00.0 02/08/2016 VYVANSE 50 MG CAPSULE 30.00 30 [id number] 02/06/2016 [id number] N [id number] 00.0 02/06/2016 VYVANSE 50 MG CAPSULE 30.00 30 [id number] 02/06/2016 [id number] N [id number] 00.0 *N/R N=New R=Refill +MED Daily Prescribers for prescriptions listed ---------------------------------------------------------------------------------------------------------------------------------- [id number] Tandy Gaw, L (PA-C); [address] [place] [address], [place] [address] [id number] STEVENSON, AMY RENE DO; [address] [place] [address] [id number] HOMMEL, SEAN B (DO); [address] [place] [address],  [place] [address] [id number] Buel Ream E DDS; DR. Buel Ream, [address], [place] [address]  Associated Signs/Symptoms: Depression Symptoms:  depressed mood, insomnia, psychomotor agitation, feelings of worthlessness/guilt, difficulty concentrating, hopelessness, recurrent thoughts of death, (Hypo) Manic Symptoms:  Distractibility, Flight of Ideas, Impulsivity, Irritable Mood, Labiality of Mood, Sexually Inapproprite Behavior, Anxiety Symptoms:  none Psychotic Symptoms:  none PTSD Symptoms: Negative  Past Psychiatric History: Psychiatric history of postpartum depression and prior participation in the partial hospitalization program at [facility]  Previous Psychotropic Medications: Yes - she has had negative or equivocal reactions to 5 different SSRI medications, including Zoloft, Prozac, Lexapro, Vibryd, Trintillex.  She even had hallucinations and paranoia with Vibryd  Substance Abuse History in the last 12 months:  No.  Consequences of Substance Abuse: Negative  Past Medical History:  Past Medical History:  Diagnosis Date  . Eczema 2012    Past Surgical History:  Procedure Laterality Date  . BREAST ENHANCEMENT SURGERY Bilateral June  2015  . WISDOM TOOTH EXTRACTION      Family Psychiatric History: She suspects that her mom has bipolar disorder, but mom has never received mental health care  Family History:  Family History  Problem Relation Age of Onset  . Hyperlipidemia Mother   . Personality disorder Maternal Grandmother   . Depression Maternal Grandmother   . Mood Disorder Maternal Grandmother     Social History:   Social History   Social History  . Marital status: Single    Spouse name: N/A  . Number of children: N/A  . Years of education: N/A   Occupational History  . CMA    Social History Main Topics  . Smoking status: Former Smoker    Types: Cigarettes    Quit date: 11/2014  . Smokeless tobacco: Never Used     Comment: quit 2 years  . Alcohol use Yes     Comment: once or twice a month - socially  . Drug use: No  . Sexual activity: Yes    Partners: Male    Birth control/ protection: IUD     Comment: condoms   Other Topics Concern  . None   Social History Narrative  . None    Additional Social History: Currently works as a Clinical biochemist at [employer]  Allergies:   Allergies  Allergen Reactions  . Antacid [Alum & Mag Hydroxide-Simeth] Nausea And Vomiting  . Keflex [Cephalexin] Nausea And Vomiting  . Penicillins Nausea And Vomiting  . Trintellix [Vortioxetine]     Swelling/numbness and tingling of extremities.   Elliot Cousin [facility]]     Abnormal Frightening Dreams    Metabolic Disorder Labs: No results found for: HGBA1C, MPG No results found for: PROLACTIN Lab Results  Component Value Date   CHOL 188 09/08/2016   TRIG 30 09/08/2016   HDL 71 09/08/2016   CHOLHDL 2.6 09/08/2016   VLDL 6 09/08/2016   LDLCALC 111 (H) 09/08/2016   LDLCALC 89 06/18/2015     Current Medications: Current Outpatient Prescriptions  Medication Sig Dispense Refill  . amphetamine-dextroamphetamine (ADDERALL) 30 MG tablet Take 1 tablet by mouth 2 (two) times daily. 60 tablet 0  . Clobetasol Prop  Emollient Base (CLOBETASOL PROPIONATE E) 0.05 % emollient cream Use twice a day only as needed for eczema. 30 g 6  . clonazePAM (KLONOPIN) 0.5 MG tablet Take 1 tablet (0.5 mg total) by mouth 2 (two) times daily as needed for anxiety. Will cause sedation. 20 tablet 1  . fexofenadine (ALLEGRA ALLERGY) 180 MG tablet Take 1 tablet (180 mg total) by mouth daily. 90 tablet 3  . fluocinonide gel (LIDEX) 0.05 % Apply 1 application topically 4 (four) times daily. Use for 1-2 weeks at a time. 60 g 4  . frovatriptan (FROVA) 2.5 MG tablet Take 1 tablet (2.5 mg total) by mouth as needed for migraine. If recurs, may repeat after 2 hours. Max of 3 tabs in 24 hours. 10 tablet 0  . Ginkgo Biloba 40 MG TABS Take by mouth.    . Levonorgestrel (SKYLA) 13.5 MG IUD by Intrauterine route.    . metroNIDAZOLE (METROGEL) 0.75 % vaginal gel Place 1 Applicatorful vaginally 2 (two) times daily. 70 g 4  . rizatriptan (MAXALT-MLT) 5 MG disintegrating tablet Take 1 tablet (5 mg total) by mouth as needed for migraine. May repeat in 2 hours  if needed 10 tablet 3  . tacrolimus (PROTOPIC) 0.1 % ointment Apply topically 2 (two) times daily. Only as needed to eczema spots. 100 g 1  . traZODone (DESYREL) 50 MG tablet Take 0.5-1 tablets (25-50 mg total) by mouth at bedtime as needed for sleep. 30 tablet 2  . valACYclovir (VALTREX) 1000 MG tablet Take 2 tablets twice a day for one day for outbreak. 8 tablet 0  . Vitamin D, Ergocalciferol, (DRISDOL) 50000 units CAPS capsule Take 1 capsule (50,000 Units total) by mouth every 7 (seven) days. Recheck Vitamin D in 3 Months 12 capsule 0  . ARIPiprazole (ABILIFY) 5 MG tablet Take 1 tablet (5 mg total) by mouth daily. 90 tablet 0   No current facility-administered medications for this visit.     Neurologic: Headache: Negative Seizure: Negative Paresthesias:Negative  Musculoskeletal: Strength & Muscle Tone: within normal limits Gait & Station: normal Patient leans: N/A  Psychiatric  Specialty Exam: Review of Systems  Constitutional: Negative.   HENT: Negative.   Respiratory: Negative.   Cardiovascular: Negative.   Gastrointestinal: Negative.   Musculoskeletal: Negative.   Neurological: Negative.   Psychiatric/Behavioral: Positive for depression. The patient has insomnia.     Blood pressure 106/64, pulse 86, height  (1.499 m), weight 112 lb 12.8 oz (51.2 kg), SpO2 96 %.Body mass index is 22.78 kg/m.  General Appearance: Casual and Fairly Groomed  Eye Contact:  Good  Speech:  Clear and Coherent  Volume:  Normal  Mood:  Depressed and Dysphoric  Affect:  Non-Congruent  Thought Process:  Coherent  Orientation:  Full (Time, Place, and Person)  Thought Content:  Logical  Suicidal Thoughts:  No  Homicidal Thoughts:  No  Memory:  Immediate;   Good  Judgement:  Fair  Insight:  Fair  Psychomotor Activity:  Normal  Concentration:  Concentration: Fair  Recall:  NA  Fund of Knowledge:Good  Language: Good  Akathisia:  Negative  Handed:  Right  AIMS (if indicated):  na  Assets:  Communication Skills Desire for Improvement Financial Resources/Insurance Housing Intimacy Leisure Time Physical Health Resilience Social Support Talents/Skills Transportation  ADL's:  Intact  Cognition: WNL  Sleep:  3-4 hours    Treatment Plan Summary: Victoria Barnes is a 31 year old female with a psychiatric history of anxiety unspecified, ADHD, and postpartum depression she presents today with a psychiatric history most consistent with bipolar disorder. The patient was receptive to receiving education about this diagnosis, and also agreeable to initiating therapy as below. She has a family history of suspected bipolar disorder, and a history of postpartum depression. She has a recent history last year of a significant episode of mood lability, risky behaviors, increased spending, increased sexuality, and anger/aggression, and suicidal thoughts resulting in her participation  in the partial hospitalization program. She has had a negative or equal local response to 5 different SSRIs, and psychotic symptoms with one of those SSRIs. Will proceed as below and follow up in 2 weeks. She does not present any acute safety issues, and is agreeable to participating in outpatient titration of mood stabilizer.  1. Bipolar I disorder (HCC)    - Initiate Abilify 2.5 mg daily for 3 days, then increase to 5 mg daily - Reviewed the risks and benefits of atypical antipsychotic use - I recommended that the patient not use Adderall for the time being - Trazodone 50 mg at night for bedtime as needed - Return to clinic in 2 weeks  Burnard Leigh, MD 3/30/201811:00 AM

## 2017-01-30 NOTE — Patient Instructions (Signed)
Hold off on taking any adderall for this next few days  Start Abilify 2.5 mg daily, increase to 5 mg in 3-4 days as tolerated  Come back to see me in 2-3 weeks

## 2017-02-03 ENCOUNTER — Ambulatory Visit (INDEPENDENT_AMBULATORY_CARE_PROVIDER_SITE_OTHER): Payer: 59 | Admitting: Physician Assistant

## 2017-02-03 ENCOUNTER — Encounter: Payer: Self-pay | Admitting: Physician Assistant

## 2017-02-03 VITALS — BP 101/67 | HR 102 | Ht 59.0 in | Wt 114.0 lb

## 2017-02-03 DIAGNOSIS — F319 Bipolar disorder, unspecified: Secondary | ICD-10-CM | POA: Diagnosis not present

## 2017-02-03 NOTE — Progress Notes (Signed)
   Subjective:    Patient ID: Victoria Barnes, female    DOB: [birth year], 31 y.o.   MRN: [id number]  HPI Pt is a 31 yo female who presents to the clinic to discuss recent psychiatry visit. She got a new dx of bipolar and started on Abilify.   Review of Systems  All other systems reviewed and are negative.      Objective:   Physical Exam  Constitutional: She is oriented to person, place, and time. She appears well-developed and well-nourished.  Neurological: She is alert and oriented to person, place, and time.  Psychiatric: She has a normal mood and affect. Her behavior is normal.          Assessment & Plan:  Marland KitchenMarland KitchenDiagnoses and all orders for this visit:  Bipolar 1 disorder (HCC)   Pt is managed by psychiatry. She is tolerating medication very well. She denies any side effects. She has follow up in 3 weeks with psychiatrist. I do think some counseling could help as well. Please contact psych for any advice on good counselor. I will be doing some research myself.

## 2017-02-11 ENCOUNTER — Other Ambulatory Visit: Payer: Self-pay | Admitting: *Deleted

## 2017-02-11 MED ORDER — FLUTICASONE PROPIONATE 50 MCG/ACT NA SUSP: 1.0000 | Freq: Every day | NASAL | 2 refills | Status: DC

## 2017-02-17 ENCOUNTER — Ambulatory Visit (INDEPENDENT_AMBULATORY_CARE_PROVIDER_SITE_OTHER): Payer: 59 | Admitting: Psychiatry

## 2017-02-17 ENCOUNTER — Other Ambulatory Visit: Payer: Self-pay | Admitting: Physician Assistant

## 2017-02-17 DIAGNOSIS — Z79899 Other long term (current) drug therapy: Secondary | ICD-10-CM

## 2017-02-17 DIAGNOSIS — Z87891 Personal history of nicotine dependence: Secondary | ICD-10-CM

## 2017-02-17 DIAGNOSIS — F902 Attention-deficit hyperactivity disorder, combined type: Secondary | ICD-10-CM

## 2017-02-17 DIAGNOSIS — Z818 Family history of other mental and behavioral disorders: Secondary | ICD-10-CM | POA: Diagnosis not present

## 2017-02-17 DIAGNOSIS — F319 Bipolar disorder, unspecified: Secondary | ICD-10-CM

## 2017-02-17 MED ORDER — ARIPIPRAZOLE 2 MG PO TABS: 2.0000 mg | ORAL_TABLET | Freq: Every day | ORAL | 0 refills | Status: DC

## 2017-02-17 MED ORDER — AMPHETAMINE-DEXTROAMPHETAMINE 30 MG PO TABS: 30.0000 mg | ORAL_TABLET | Freq: Two times a day (BID) | ORAL | 0 refills | Status: DC

## 2017-02-17 MED FILL — VANDAZOLE VAGINAL 0.75% GEL: 0.75 | 5 days supply | Qty: 70 | Fill #0

## 2017-02-17 MED FILL — AMPHETAMINE SALTS 30 MG TAB: 30 | 30 days supply | Qty: 60 | Fill #0

## 2017-02-17 MED FILL — clonazePAM 0.5 MG TABS: 0.5 | 10 days supply | Qty: 20 | Fill #1

## 2017-02-17 MED FILL — ARIPiprazole 2 MG TABS: 2 | 90 days supply | Qty: 90 | Fill #0

## 2017-02-17 NOTE — Progress Notes (Signed)
BH MD/PA/NP OP Progress Note  02/17/2017 10:03 AM Victoria Barnes  MRN:  [id number]  Chief Complaint: some irritability, shopping, but better  Subjective:  Victoria Barnes   Visit Diagnosis:    ICD-9-CM ICD-10-CM   1. Bipolar I disorder (HCC) 296.7 F31.9 ARIPiprazole (ABILIFY) 2 MG tablet  2. 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 She reports that she has had good benefit from Abilify 5 mg, and her boyfriend has noticed that she is much more calm, less mood swings, less irritable and agitated. She reports she has been much less impulsive in her shopping, she has significantly decreased the promiscuity outside of the relationship with her boyfriend, and she is much less irritable and aggressive when she is upset. She feels like she still has some room for improvement, and wonders about an increase in Abilify. The specific areas she notes she continues to struggle with impulse shopping, periods of internal irritability and moodiness. She denies any suicidal thoughts. She denies any drug use. She notices that when she takes Adderall, she remains calm, but is not anxious or agitated.   We discussed increasing Abilify to 7 mg daily and the risks of this and benefits of this. We discussed that we can also begin to consider tapering Adderall to a lower dose after we find a good spot for Abilify. She has read a lot about bipolar disorder and talked with her friends and family, and they agree with this diagnosis, but they have been too nervous to suggested in the past. She is interested in group therapies and individual therapy. She agrees to get set up here for group therapy, and is interested in seeking an individual therapist as well.  Past Medical  History:  Past Medical History:  Diagnosis Date  . Eczema 2012    Past Surgical History:  Procedure Laterality Date  . BREAST ENHANCEMENT SURGERY Bilateral June 2015  . WISDOM TOOTH EXTRACTION      Family Psychiatric History: See intake H&P for full details. Reviewed, with no updates at this time.   Family History:  Family History  Problem Relation Age of Onset  . Hyperlipidemia Mother   . Personality disorder Maternal Grandmother   . Depression Maternal Grandmother   . Mood Disorder Maternal Grandmother     Social History:  Social History   Social History  . Marital status: Single    Spouse name: N/A  . Number of children: N/A  . Years of education: N/A   Occupational History  . CMA    Social History Main Topics  . Smoking status: Former Smoker    Types: Cigarettes    Quit date: 11/2014  . Smokeless tobacco: Never Used     Comment: quit 2 years  . Alcohol use Yes     Comment: once or twice a month - socially  . Drug use: No  . Sexual activity: Yes    Partners: Male    Birth control/ protection: IUD     Comment: condoms   Other Topics Concern  . Not on file   Social History Narrative  . No narrative on file    Allergies:  Allergies  Allergen Reactions  . Antacid [Alum & Mag Hydroxide-Simeth] Nausea And Vomiting  . Keflex [Cephalexin] Nausea And Vomiting  . Penicillins Nausea And Vomiting  .  Trintellix [Vortioxetine]     Swelling/numbness and tingling of extremities.   Victoria Barnes]     Abnormal Frightening Dreams    Metabolic Disorder Labs: No results found for: HGBA1C, MPG No results found for: PROLACTIN Lab Results  Component Value Date   CHOL 188 09/08/2016   TRIG 30 09/08/2016   HDL 71 09/08/2016   CHOLHDL 2.6 09/08/2016   VLDL 6 09/08/2016   LDLCALC 111 (H) 09/08/2016   LDLCALC 89 06/18/2015     Current Medications: Current Outpatient Prescriptions  Medication Sig Dispense Refill  . [START ON 03/17/2017]  amphetamine-dextroamphetamine (ADDERALL) 30 MG tablet Take 1 tablet by mouth 2 (two) times daily. 60 tablet 0  . ARIPiprazole (ABILIFY) 2 MG tablet Take 1 tablet (2 mg total) by mouth daily. 90 tablet 0  . ARIPiprazole (ABILIFY) 5 MG tablet Take 1 tablet (5 mg total) by mouth daily. 90 tablet 0  . Clobetasol Prop Emollient Base (CLOBETASOL PROPIONATE E) 0.05 % emollient cream Use twice a day only as needed for eczema. 30 g 6  . clonazePAM (KLONOPIN) 0.5 MG tablet Take 1 tablet (0.5 mg total) by mouth 2 (two) times daily as needed for anxiety. Will cause sedation. 20 tablet 1  . fexofenadine (ALLEGRA ALLERGY) 180 MG tablet Take 1 tablet (180 mg total) by mouth daily. 90 tablet 3  . fluocinonide gel (LIDEX) 0.05 % Apply 1 application topically 4 (four) times daily. Use for 1-2 weeks at a time. 60 g 4  . fluticasone (FLONASE) 50 MCG/ACT nasal spray Place 1 spray into both nostrils daily. 15.8 g 2  . frovatriptan (FROVA) 2.5 MG tablet Take 1 tablet (2.5 mg total) by mouth as needed for migraine. If recurs, may repeat after 2 hours. Max of 3 tabs in 24 hours. 10 tablet 0  . Ginkgo Biloba 40 MG TABS Take by mouth.    . Levonorgestrel (SKYLA) 13.5 MG IUD by Intrauterine route.    . metroNIDAZOLE (METROGEL) 0.75 % vaginal gel Place 1 Applicatorful vaginally 2 (two) times daily. 70 g 4  . rizatriptan (MAXALT-MLT) 5 MG disintegrating tablet Take 1 tablet (5 mg total) by mouth as needed for migraine. May repeat in 2 hours if needed 10 tablet 3  . tacrolimus (PROTOPIC) 0.1 % ointment Apply topically 2 (two) times daily. Only as needed to eczema spots. 100 g 1  . traZODone (DESYREL) 50 MG tablet Take 0.5-1 tablets (25-50 mg total) by mouth at bedtime as needed for sleep. 30 tablet 2  . valACYclovir (VALTREX) 1000 MG tablet Take 2 tablets twice a day for one day for outbreak. 8 tablet 0  . Vitamin D, Ergocalciferol, (DRISDOL) 50000 units CAPS capsule Take 1 capsule (50,000 Units total) by mouth every 7 (seven)  days. Recheck Vitamin D in 3 Months 12 capsule 0   No current facility-administered medications for this visit.     Neurologic: Headache: Negative Seizure: Negative Paresthesias: Negative  Musculoskeletal: Strength & Muscle Tone: within normal limits Gait & Station: normal Patient leans: N/A  Psychiatric Specialty Exam: ROS  There were no vitals taken for this visit.There is no height or weight on file to calculate BMI.  General Appearance: Casual and Fairly Groomed  Eye Contact:  Good  Speech:  Clear and Coherent  Volume:  Normal  Mood:  Euthymic  Affect:  Appropriate and Congruent  Thought Process:  Goal Directed  Orientation:  Full (Time, Place, and Person)  Thought Content: Logical   Suicidal Thoughts:  No  Homicidal Thoughts:  No  Memory:  Immediate;   Good  Judgement:  Good  Insight:  Good  Psychomotor Activity:  Normal  Concentration:  Concentration: Good  Recall:  Good  Fund of Knowledge: NA  Language: Good  Akathisia:  Negative  Handed:  Right  AIMS (if indicated):  na  Assets:  Communication Skills Desire for Improvement Financial Resources/Insurance Housing Intimacy Leisure Time Physical Health Resilience Social Support Talents/Skills Transportation Vocational/Educational  ADL's:  Intact  Cognition: WNL  Sleep:  6 hours, better   Treatment Plan Summary: Victoria Barnes is a 31 year old female with a psychiatric history consistent with bipolar 1 disorder. She appears to struggle with a history of childhood ADHD, comorbid with bipolar disorder. She presented most recently with hypomania, and has responded well to initiation of Abilify. We will titrate as below, and work to get the patient set up with group therapies. Will follow-up with this writer in 6-8 weeks.  No acute safety issues at this time.  1. Bipolar I disorder (HCC)   2. Attention deficit hyperactivity disorder (ADHD), combined type    Abilify increased to 7 mg every evening Continue  Adderall 30 mg 1-2 times daily; patient generally takes it once daily Continue trazodone at bedtime as needed; and she notes that she is sleeping 6 hours, which is much better than her typical sleep Patient is not using clonazepam Return to clinic in 6-8 weeks CC a follow-up for group therapy intake, once weekly group  Burnard Leigh, MD 02/17/2017, 10:03 AM

## 2017-02-18 NOTE — Telephone Encounter (Signed)
Refill

## 2017-02-22 ENCOUNTER — Encounter (HOSPITAL_COMMUNITY): Payer: Self-pay | Admitting: Psychiatry

## 2017-02-23 ENCOUNTER — Encounter: Payer: Self-pay | Admitting: Physician Assistant

## 2017-02-23 ENCOUNTER — Ambulatory Visit (INDEPENDENT_AMBULATORY_CARE_PROVIDER_SITE_OTHER): Payer: 59 | Admitting: Physician Assistant

## 2017-02-23 VITALS — BP 120/71 | HR 102 | Ht 59.0 in | Wt 116.0 lb

## 2017-02-23 DIAGNOSIS — R5383 Other fatigue: Secondary | ICD-10-CM | POA: Diagnosis not present

## 2017-02-23 DIAGNOSIS — F319 Bipolar disorder, unspecified: Secondary | ICD-10-CM | POA: Diagnosis not present

## 2017-02-23 DIAGNOSIS — T889XXA Complication of surgical and medical care, unspecified, initial encounter: Secondary | ICD-10-CM | POA: Diagnosis not present

## 2017-02-23 NOTE — Progress Notes (Signed)
   Subjective:    Patient ID: Victoria Barnes, female    DOB: 1985/11/24, 31 y.o.   MRN: [id number]  HPI  Pt is a 31 yo female who presents to the clinic to discuss fatigue. She did recently start abilify for bipolar and psychiatrist has increased dose. She is aware that a side effect is fatigue. She wants to make sure nothing metabolically is causing her fatigue. She has a hx of IDA. She does not have a menstrual cycle. She finds at night she is ready to go to bed at 9. She sleeps well. She feels like she is struggling to concentrate at work. She is taking her stimulant with no effect on energy. She denies any pica.    Review of Systems  All other systems reviewed and are negative.      Objective:   Physical Exam  Constitutional: She is oriented to person, place, and time. She appears well-developed and well-nourished.  Cardiovascular: Normal rate, regular rhythm and normal heart sounds.   Neurological: She is alert and oriented to person, place, and time.  Psychiatric: She has a normal mood and affect. Her behavior is normal.          Assessment & Plan:  Marland KitchenMarland KitchenDiagnoses and all orders for this visit:  No energy -     CBC -     Comprehensive metabolic panel -     C-reactive protein -     Ferritin -     Sedimentation rate -     TSH -     Vitamin B12 -     Vit D  25 hydroxy (rtn osteoporosis monitoring)  Bipolar 1 disorder (HCC) -     CBC -     Comprehensive metabolic panel -     C-reactive protein -     Ferritin -     Sedimentation rate -     TSH -     Vitamin B12 -     Vit D  25 hydroxy (rtn osteoporosis monitoring)  Side effects of treatment, initial encounter -     CBC -     Comprehensive metabolic panel -     C-reactive protein -     Ferritin -     Sedimentation rate -     TSH -     Vitamin B12 -     Vit D  25 hydroxy (rtn osteoporosis monitoring)   Will order labs to evaluate fatigue fully. No concern for sleep apnea.  Discussed how treating bipolar can  take time to adjust to NOT being manic and the energy that comes with that.

## 2017-02-24 LAB — CBC
HCT: 38.8 % (ref 35.0–45.0)
Hemoglobin: 12.8 g/dL (ref 11.7–15.5)
MCH: 29.7 pg (ref 27.0–33.0)
MCHC: 33 g/dL (ref 32.0–36.0)
MCV: 90 fL (ref 80.0–100.0)
MPV: 10.8 fL (ref 7.5–12.5)
Platelets: 230 K/uL (ref 140–400)
RBC: 4.31 MIL/uL (ref 3.80–5.10)
RDW: 13.1 % (ref 11.0–15.0)
WBC: 8.1 K/uL (ref 3.8–10.8)

## 2017-02-24 LAB — COMPREHENSIVE METABOLIC PANEL WITH GFR
ALT: 13 U/L (ref 6–29)
AST: 16 U/L (ref 10–30)
Albumin: 4.2 g/dL (ref 3.6–5.1)
Alkaline Phosphatase: 52 U/L (ref 33–115)
BUN: 8 mg/dL (ref 7–25)
CO2: 23 mmol/L (ref 20–31)
Calcium: 9.3 mg/dL (ref 8.6–10.2)
Chloride: 107 mmol/L (ref 98–110)
Creat: 0.73 mg/dL (ref 0.50–1.10)
Glucose, Bld: 91 mg/dL (ref 65–99)
Potassium: 3.9 mmol/L (ref 3.5–5.3)
Sodium: 138 mmol/L (ref 135–146)
Total Bilirubin: 0.3 mg/dL (ref 0.2–1.2)
Total Protein: 6.9 g/dL (ref 6.1–8.1)

## 2017-02-24 LAB — FERRITIN: Ferritin: 18 ng/mL (ref 10–154)

## 2017-02-24 LAB — SEDIMENTATION RATE: Sed Rate: 4 mm/h (ref 0–20)

## 2017-02-24 LAB — TSH: TSH: 2.06 m[IU]/L

## 2017-02-24 LAB — VITAMIN B12: Vitamin B-12: 772 pg/mL (ref 200–1100)

## 2017-02-24 LAB — VITAMIN D 25 HYDROXY (VIT D DEFICIENCY, FRACTURES): Vit D, 25-Hydroxy: 25 ng/mL — ABNORMAL LOW (ref 30–100)

## 2017-02-24 LAB — C-REACTIVE PROTEIN: CRP: 0.4 mg/L (ref <8.0)

## 2017-02-24 MED ORDER — FUSION PLUS PO CAPS: 1.0000 | ORAL_CAPSULE | Freq: Every day | ORAL | 11 refills | Status: DC

## 2017-02-24 NOTE — Addendum Note (Signed)
Addended by: Jomarie Longs on: 02/24/2017 11:25 AM   Modules accepted: Orders

## 2017-03-04 ENCOUNTER — Encounter (HOSPITAL_COMMUNITY): Payer: Self-pay | Admitting: Licensed Clinical Social Worker

## 2017-03-04 ENCOUNTER — Ambulatory Visit (INDEPENDENT_AMBULATORY_CARE_PROVIDER_SITE_OTHER): Payer: 59 | Admitting: Licensed Clinical Social Worker

## 2017-03-04 DIAGNOSIS — F319 Bipolar disorder, unspecified: Secondary | ICD-10-CM

## 2017-03-04 MED FILL — FUSION PLUS CAPSULE: 30 days supply | Qty: 30 | Fill #0

## 2017-03-04 MED FILL — FLUTICASONE PROP 50 MCG SPR: 50 | 30 days supply | Qty: 16 | Fill #0

## 2017-03-04 NOTE — Progress Notes (Signed)
Comprehensive Clinical Assessment (CCA) Note  03/04/2017 Victoria Barnes [id number]  Visit Diagnosis:      ICD-9-CM ICD-10-CM   1. Bipolar I disorder (HCC) 296.7 F31.9       CCA Part One  Part One has been completed on paper by the patient.  (See scanned document in Chart Review)  CCA Part Two A  Intake/Chief Complaint:  CCA Intake With Chief Complaint CCA Part Two Date: 03/04/17 CCA Part Two Time: 1609 Chief Complaint/Presenting Problem: Pt is referred by Dr. Gaspar Skeeters for indivudal and group counseling. Pt has a dx of bipolar and went to her PCP who referred her to psychiatrist. Pt works at [employer] in [place], [place].. She as previously in the [facility] program 9/17. 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, home owner, to work a Nature conservation officer Type of Services Patient Feels Are Needed: Outpatient  Mental Health Symptoms Depression:  Depression: Change in energy/activity, Difficulty Concentrating, Fatigue, Irritability, Sleep (too much or little), Tearfulness  Mania:  Mania: Change in energy/activity, Euphoria, Increased Energy, Racing thoughts, Recklessness, Irritability, Overconfidence  Anxiety:   Anxiety: Tension, Sleep  Psychosis:     Trauma:  Trauma: Avoids reminders of event (father died age 69)  Obsessions:     Compulsions:     Inattention:     Hyperactivity/Impulsivity:     Oppositional/Defiant Behaviors:     Borderline Personality:  Emotional Irregularity: Chronic feelings of emptiness, Intense/inappropriate anger  Other Mood/Personality Symptoms:      Mental Status Exam Appearance and self-care  Stature:  Stature: Small  Weight:  Weight: Average weight  Clothing:  Clothing: Casual   Grooming:  Grooming: Normal  Cosmetic use:  Cosmetic Use: Age appropriate  Posture/gait:  Posture/Gait: Normal  Motor activity:  Motor Activity: Not Remarkable  Sensorium  Attention:  Attention: Distractible  Concentration:  Concentration: Scattered  Orientation:  Orientation: X5  Recall/memory:  Recall/Memory: Defective in short-term  Affect and Mood  Affect:  Affect: Appropriate  Mood:  Mood: Euthymic  Relating  Eye contact:  Eye Contact: Normal  Facial expression:  Facial Expression: Responsive  Attitude toward examiner:  Attitude Toward Examiner: Cooperative  Thought and Language  Speech flow: Speech Flow: Normal  Thought content:  Thought Content: Appropriate to mood and circumstances  Preoccupation:  Preoccupations: Ruminations  Hallucinations:     Organization:     Company secretary of Knowledge:  Fund of Knowledge: Average  Intelligence:  Intelligence: Average  Abstraction:  Abstraction: Normal  Judgement:  Judgement: Fair  Dance movement psychotherapist:  Reality Testing: Adequate  Insight:  Insight: Good  Decision Making:  Decision Making: Impulsive  Social Functioning  Social Maturity:  Social Maturity: Isolates  Social Judgement:  Social Judgement: Normal  Stress  Stressors:  Stressors: Work  Coping Ability:  Coping Ability: Deficient supports  Skill Deficits:     Supports:      Family and Psychosocial History: Family history Marital status: Single  Childhood History:  Childhood History By whom was/is the patient raised?: Mother Additional childhood history information: Bipolar mother who was no medicated, good mom/bad mom Description of patient's relationship with caregiver when they were a child: good and bad Patient's description of current relationship with people who raised him/her: father deceased, good relationship now but it can get bad How were  you disciplined when you got in trouble as a child/adolescent?: talked to,  Does patient have siblings?:  Yes Number of Siblings: 2 Description of patient's current relationship with siblings: relationship with older sister, but no relationship with younger brother Did patient suffer any verbal/emotional/physical/sexual abuse as a child?: No Did patient suffer from severe childhood neglect?: No Has patient ever been sexually abused/assaulted/raped as an adolescent or adult?: No Was the patient ever a victim of a crime or a disaster?: No Witnessed domestic violence?: No Has patient been effected by domestic violence as an adult?: No  CCA Part Two B  Employment/Work Situation: Employment / Work Psychologist, occupational Employment situation: Employed Where is patient currently employed?: [employer] How long has patient been employed?: 2 years Patient's job has been impacted by current illness: Yes Describe how patient's job has been impacted: anxiety and wouldn't sleep, attendance problems due to provider causing her problems Has patient ever been in the [employer]?: No Has patient ever served in combat?: No Did You Receive Any Psychiatric Treatment/Services While in Equities trader?: No Are There Guns or Other Weapons in Your Home?: No Are These Comptroller?: No  Education: Engineer, civil (consulting) Currently Attending: [employer] Last Grade Completed: 16 Did Garment/textile technologist From [employer]?: Yes Did Theme park manager?: Yes What Type of College Degree Do you Have?: BS in [employer] Did [place] Attend Graduate School?: No  Religion: Religion/Spirituality Are You A Religious Person?: Yes What is Your Religious Affiliation?: Chiropodist: Leisure / Recreation Leisure and Hobbies: dancing, watching movies  Exercise/Diet: Exercise/Diet Do You Exercise?: No Have You Gained or Lost A Significant Amount of Weight in the Past Six Months?: No Do You Follow a Special Diet?: No Do You Have Any Trouble Sleeping?: Yes Explanation of Sleeping Difficulties: Abilify  helps me sleep too much, Im always tired  CCA Part Two C  Alcohol/Drug Use: Alcohol / Drug Use History of alcohol / drug use?: No history of alcohol / drug abuse                      CCA Part Three  ASAM's:  Six Dimensions of Multidimensional Assessment  Dimension 1:  Acute Intoxication and/or Withdrawal Potential:     Dimension 2:  Biomedical Conditions and Complications:     Dimension 3:  Emotional, Behavioral, or Cognitive Conditions and Complications:     Dimension 4:  Readiness to Change:     Dimension 5:  Relapse, Continued use, or Continued Problem Potential:     Dimension 6:  Recovery/Living Environment:      Substance use Disorder (SUD)    Social Function:  Social Functioning Social Maturity: Isolates Social Judgement: Normal  Stress:  Stress Stressors: Work Coping Ability: Deficient supports Patient Takes Medications The Biochemist, clinical Instructed?: Yes Priority Risk: Low Acuity  Risk Assessment- Self-Harm Potential: Risk Assessment For Self-Harm Potential Thoughts of Self-Harm: No current thoughts Method: No plan Availability of Means: No access/NA  Risk Assessment -Dangerous to Others Potential: Risk Assessment For Dangerous to Others Potential Method: No Plan Availability of Means: No access or NA Intent: Vague intent or NA  DSM5 Diagnoses: Patient Active Problem List   Diagnosis Date Noted  . Bipolar 1 disorder (HCC) 02/23/2017  . No energy 02/23/2017  . Side effects of treatment 02/23/2017  . Influenza-like illness 12/11/2016  . Episodic mood disorder (HCC) 11/11/2016  . Paresthesias 09/10/2016  . Dehydration 08/06/2016  . Severe recurrent major depression without  psychotic features (HCC) 06/24/2016  . 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 Insomnia 05/22/2016  . Migraine without status migrainosus, not intractable 02/26/2016  . Attention deficit hyperactivity disorder (ADHD)  01/08/2016  . 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 Abnormal weight gain 07/19/2015  . Eczema 07/17/2015  . Depression 07/17/2015    Patient Centered Plan: Patient is on the following Treatment Plan(s):  Bipolar  Recommendations for Services/Supports/Treatments: Recommendations for Services/Supports/Treatments Recommendations For Services/Supports/Treatments: Individual Therapy, Medication Management  Treatment Plan Summary: To be determined by individual counselor    Referrals to Alternative Service(s): Referred to Alternative Service(s):   Place:   Date:   Time:    Referred to Alternative Service(s):   Place:   Date:   Time:    Referred to Alternative Service(s):   Place:   Date:   Time:    Referred to Alternative Service(s):   Place:   Date:   Time:     Vernona Rieger

## 2017-03-16 ENCOUNTER — Encounter (HOSPITAL_COMMUNITY): Payer: Self-pay | Admitting: Psychiatry

## 2017-03-17 NOTE — Telephone Encounter (Signed)
Hey we sent over Victoria Barnes's FMLA right?

## 2017-03-26 ENCOUNTER — Ambulatory Visit (HOSPITAL_COMMUNITY): Payer: Self-pay | Admitting: Licensed Clinical Social Worker

## 2017-03-31 ENCOUNTER — Other Ambulatory Visit: Payer: Self-pay | Admitting: *Deleted

## 2017-03-31 MED ORDER — FLUCONAZOLE 150 MG PO TABS: ORAL_TABLET | ORAL | 0 refills | Status: DC

## 2017-03-31 MED FILL — AMPHETAMINE SALTS 30 MG TAB: 30 | 30 days supply | Qty: 60 | Fill #0

## 2017-03-31 MED FILL — FLUCONAZOLE 150 MG TABLET: 150 | 3 days supply | Qty: 2 | Fill #0

## 2017-04-02 ENCOUNTER — Ambulatory Visit (INDEPENDENT_AMBULATORY_CARE_PROVIDER_SITE_OTHER): Payer: 59 | Admitting: Licensed Clinical Social Worker

## 2017-04-02 DIAGNOSIS — F319 Bipolar disorder, unspecified: Secondary | ICD-10-CM | POA: Diagnosis not present

## 2017-04-02 NOTE — Progress Notes (Signed)
   THERAPIST PROGRESS NOTE  Session Time: 3:10-4pm  Participation Level: Active  Behavioral Response: CasualAlertEuthymic  Type of Therapy: Individual Therapy  Treatment Goals addressed: Coping  Interventions: CBT  Summary: Victoria Barnes is a 31 y.o. female. Pt discussed her psychiatric symptoms and current life events. Today was her first day of therapy. Spent a considerable amount of time building a trusting therapeutic relationship. Pt reports she has stopped taking her abilify. She says she feels much better been off the medication. Processed her decision. Pt is trying alternative methods to deal with her mental wellness. Pt is using yoga, meditation and eating fresh fruits and veggies, no meat. Taught pt grounding technique [phone]). Pt was receptive to the mindfulness practice and was grateful to add that to her mindfulness practice.  Suicidal/Homicidal: Nowithout intent/plan  Therapist Response: Assessed pt'Barnes current functioning and reviewed progress. Pt has graduated from college (on-line) and is now looking at alternatives for extending her education.. Processed this with pt. Assisted pt processing her current coping skills and new mindfulness technique.  Plan: Return again in 3 weeks.  Diagnosis: Axis I: Bipolar 1    Victoria Barnes, LCAS 04/02/2017

## 2017-04-07 ENCOUNTER — Encounter (HOSPITAL_COMMUNITY): Payer: Self-pay | Admitting: Licensed Clinical Social Worker

## 2017-04-07 ENCOUNTER — Encounter (HOSPITAL_COMMUNITY): Payer: Self-pay | Admitting: Psychiatry

## 2017-04-07 ENCOUNTER — Ambulatory Visit (INDEPENDENT_AMBULATORY_CARE_PROVIDER_SITE_OTHER): Payer: 59 | Admitting: Psychiatry

## 2017-04-07 DIAGNOSIS — Z87891 Personal history of nicotine dependence: Secondary | ICD-10-CM | POA: Diagnosis not present

## 2017-04-07 DIAGNOSIS — F902 Attention-deficit hyperactivity disorder, combined type: Secondary | ICD-10-CM

## 2017-04-07 DIAGNOSIS — Z818 Family history of other mental and behavioral disorders: Secondary | ICD-10-CM | POA: Diagnosis not present

## 2017-04-07 MED ORDER — AMPHETAMINE-DEXTROAMPHETAMINE 30 MG PO TABS: 30.0000 mg | ORAL_TABLET | Freq: Two times a day (BID) | ORAL | 0 refills | Status: DC

## 2017-04-07 NOTE — Progress Notes (Signed)
BH MD/PA/NP OP Progress Note  04/07/2017 2:19 PM Victoria Barnes  MRN:  [id number]  Chief Complaint:  Subjective:  Victoria Barnes presents today for follow-up management.  She reports that she stopped Abilify about 3 weeks ago. She has not had any increase in her sexual promiscuity, hypomanic or manic symptoms, and reports that she's been generally calm. She reports that she was so sleepy with the Abilify 7 mg that she ended up discontinuing because she felt like she just wanted to nap all day.  She does admit that she has more goal-directed ideas, is enrolling in school, does feel more restless and bored in her relationship, but reports that she is trying to manage some of this restlessness with exercising, healthy eating, monitoring her caffeine intake.  Uses Adderall 30 mg generally 1-2 times a day, takes weekend vacations. Does not use the clonazepam on any regular basis.  She denies any suicidal thoughts or significant mood symptoms.  We discussed a plan of adding back Abilify 2 mg if necessary, if she notices increase in lability or risky behaviors.  We also discussed potentially initiating Lamictal if we were to notice the up-and-down moods start to return.  She agrees to follow-up in 8 weeks to see how things are going being off Abilify, and agrees to Pharmacist, hospital know if she needs to come in sooner.  Visit Diagnosis:    ICD-9-CM ICD-10-CM   1. Attention deficit hyperactivity disorder (ADHD), combined type 314.01 F90.2 amphetamine-dextroamphetamine (ADDERALL) 30 MG tablet     DISCONTINUED: amphetamine-dextroamphetamine (ADDERALL) 30 MG tablet    Past Psychiatric History: See intake H&P for full details. Reviewed, with no updates at this time.  Past Medical History:  Past Medical History:  Diagnosis Date  . Eczema 2012    Past Surgical History:  Procedure Laterality Date  . BREAST ENHANCEMENT SURGERY Bilateral June 2015  . WISDOM TOOTH EXTRACTION      Family Psychiatric History: See  intake H&P for full details. Reviewed, with no updates at this time.   Family History:  Family History  Problem Relation Age of Onset  . Hyperlipidemia Mother   . Personality disorder Maternal Grandmother   . Depression Maternal Grandmother   . Mood Disorder Maternal Grandmother     Social History:  Social History   Social History  . Marital status: Single    Spouse name: N/A  . Number of children: N/A  . Years of education: N/A   Occupational History  . CMA    Social History Main Topics  . Smoking status: Former Smoker    Types: Cigarettes    Quit date: 11/2014  . Smokeless tobacco: Never Used     Comment: quit 2 years  . Alcohol use Yes     Comment: once or twice a month - socially  . Drug use: No  . Sexual activity: Yes    Partners: Male    Birth control/ protection: IUD     Comment: condoms   Other Topics Concern  . None   Social History Narrative  . None    Allergies:  Allergies  Allergen Reactions  . Antacid [Alum & Mag Hydroxide-Simeth] Nausea And Vomiting  . Keflex [Cephalexin] Nausea And Vomiting  . Penicillins Nausea And Vomiting  . Trintellix [Vortioxetine]     Swelling/numbness and tingling of extremities.   Elliot Cousin Hcl]     Abnormal Frightening Dreams    Metabolic Disorder Labs: No results found for: HGBA1C, MPG No results  found for: PROLACTIN Lab Results  Component Value Date   CHOL 188 09/08/2016   TRIG 30 09/08/2016   HDL 71 09/08/2016   CHOLHDL 2.6 09/08/2016   VLDL 6 09/08/2016   LDLCALC 111 (H) 09/08/2016   LDLCALC 89 06/18/2015     Current Medications: Current Outpatient Prescriptions  Medication Sig Dispense Refill  . [START ON 05/05/2017] amphetamine-dextroamphetamine (ADDERALL) 30 MG tablet Take 1 tablet by mouth 2 (two) times daily. 60 tablet 0  . Clobetasol Prop Emollient Base (CLOBETASOL PROPIONATE E) 0.05 % emollient cream Use twice a day only as needed for eczema. 30 g 6  . fexofenadine (ALLEGRA  ALLERGY) 180 MG tablet Take 1 tablet (180 mg total) by mouth daily. 90 tablet 3  . fluconazole (DIFLUCAN) 150 MG tablet Take one tab, may take second tab if no improvement after 72 hours. 2 tablet 0  . fluocinonide gel (LIDEX) 0.05 % Apply 1 application topically 4 (four) times daily. Use for 1-2 weeks at a time. 60 g 4  . fluticasone (FLONASE) 50 MCG/ACT nasal spray Place 1 spray into both nostrils daily. 15.8 g 2  . Ginkgo Biloba 40 MG TABS Take by mouth.    . Iron-FA-B Cmp-C-Biot-Probiotic (FUSION PLUS) CAPS Take 1 tablet by mouth daily. 30 capsule 11  . Levonorgestrel (SKYLA) 13.5 MG IUD by Intrauterine route.    . metroNIDAZOLE (METROGEL) 0.75 % vaginal gel Place 1 Applicatorful vaginally 2 (two) times daily. 70 g 4  . rizatriptan (MAXALT-MLT) 5 MG disintegrating tablet Take 1 tablet (5 mg total) by mouth as needed for migraine. May repeat in 2 hours if needed 10 tablet 3  . tacrolimus (PROTOPIC) 0.1 % ointment Apply topically 2 (two) times daily. Only as needed to eczema spots. 100 g 1  . traZODone (DESYREL) 50 MG tablet Take 0.5-1 tablets (25-50 mg total) by mouth at bedtime as needed for sleep. 30 tablet 2  . valACYclovir (VALTREX) 1000 MG tablet TAKE 2 TABLETS TWICE A DAY FOR ONE DAY FOR OUTBREAK. 8 tablet 0  . Vitamin D, Ergocalciferol, (DRISDOL) 50000 units CAPS capsule Take 1 capsule (50,000 Units total) by mouth every 7 (seven) days. Recheck Vitamin D in 3 Months 12 capsule 0   No current facility-administered medications for this visit.     Neurologic: Headache: Negative Seizure: Negative Paresthesias: Negative  Musculoskeletal: Strength & Muscle Tone: within normal limits Gait & Station: normal Patient leans: N/A  Psychiatric Specialty Exam: ROS  Blood pressure 110/64, pulse 90, height 4\' 11"  (1.499 m), weight 113 lb (51.3 kg).Body mass index is 22.82 kg/m.  General Appearance: Casual and Well Groomed  Eye Contact:  Good  Speech:  Clear and Coherent  Volume:  Normal   Mood:  Euthymic  Affect:  Appropriate and Congruent  Thought Process:  Coherent and Goal Directed  Orientation:  Full (Time, Place, and Person)  Thought Content: Logical   Suicidal Thoughts:  No  Homicidal Thoughts:  No  Memory:  Immediate;   Good  Judgement:  Fair  Insight:  Fair  Psychomotor Activity:  Normal  Concentration:  Concentration: Fair  Recall:  Fair  Fund of Knowledge: Fair  Language: Good  Akathisia:  Negative  Handed:  Right  AIMS (if indicated):  0  Assets:  Communication Skills Desire for Improvement Financial Resources/Insurance Housing Intimacy Leisure Time Physical Health Resilience Social Support Talents/Skills Transportation Vocational/Educational  ADL's:  Intact  Cognition: WNL  Sleep:  7-9 hours     Treatment Plan Summary: Montey HoraEbony T  Murthy is a 31 year old female with a history of ADHD, and mood symptoms concerning for bipolar spectrum illness. She does not present with any significant trauma or personality disordered features. She has had a negative response to multiple SSRIs over the years. She had a calming and generally mood stabilizing response with Abilify, but ultimately discontinued due to sedation. She currently presents as euthymic, so we agreed to proceed with only ADHD management for now, and reintroduce low-dose of Abilify if needed. No acute safety issues, and she engages in regular work and has a good social support system. We will follow-up in 8 weeks.  1. Attention deficit hyperactivity disorder (ADHD), combined type    Continue Adderall 30 mg twice daily in the morning and after lunch Continue trazodone 50 mg nightly when necessary for sleep Okay to hold Abilify; if she has a resurgence of mood lability, we will restart at 2 mg daily Consider Lamictal if she continues to struggle with mood lability Hypovitaminosis D being treated by primary care provider  Follow-up in 8 weeks  Burnard Leigh, MD 04/07/2017, 2:19 PM

## 2017-04-09 ENCOUNTER — Ambulatory Visit (HOSPITAL_COMMUNITY): Payer: Self-pay | Admitting: Licensed Clinical Social Worker

## 2017-04-23 ENCOUNTER — Ambulatory Visit (HOSPITAL_COMMUNITY): Payer: Self-pay | Admitting: Licensed Clinical Social Worker

## 2017-05-04 ENCOUNTER — Ambulatory Visit (HOSPITAL_COMMUNITY): Payer: Self-pay | Admitting: Licensed Clinical Social Worker

## 2017-05-08 ENCOUNTER — Other Ambulatory Visit: Payer: Self-pay

## 2017-05-08 MED ORDER — LINACLOTIDE 290 MCG PO CAPS: 290.0000 ug | ORAL_CAPSULE | Freq: Every day | ORAL | 3 refills | Status: DC

## 2017-05-08 MED ORDER — FLUCONAZOLE 150 MG PO TABS: 150.0000 mg | ORAL_TABLET | Freq: Once | ORAL | 0 refills | Status: AC

## 2017-05-08 MED FILL — FLUCONAZOLE 150 MG TABLET: 150 | 2 days supply | Qty: 2 | Fill #0

## 2017-05-11 ENCOUNTER — Other Ambulatory Visit: Payer: Self-pay

## 2017-05-11 DIAGNOSIS — F418 Other specified anxiety disorders: Secondary | ICD-10-CM

## 2017-05-11 MED ORDER — CLONAZEPAM 0.5 MG PO TABS: 0.5000 mg | ORAL_TABLET | Freq: Two times a day (BID) | ORAL | 1 refills | Status: DC | PRN

## 2017-05-15 ENCOUNTER — Telehealth: Payer: Self-pay | Admitting: Physician Assistant

## 2017-05-15 ENCOUNTER — Other Ambulatory Visit (INDEPENDENT_AMBULATORY_CARE_PROVIDER_SITE_OTHER): Payer: 59 | Admitting: Physician Assistant

## 2017-05-15 DIAGNOSIS — G43009 Migraine without aura, not intractable, without status migrainosus: Secondary | ICD-10-CM

## 2017-05-15 MED ORDER — KETOROLAC TROMETHAMINE 30 MG/ML IJ SOLN
30.0000 mg | Freq: Once | INTRAMUSCULAR | Status: AC
  Administered 2017-05-15: 30 mg via INTRAMUSCULAR

## 2017-05-15 MED ORDER — DEXAMETHASONE SODIUM PHOSPHATE 4 MG/ML IJ SOLN
4.0000 mg | Freq: Once | INTRAMUSCULAR | Status: AC
  Administered 2017-05-15: 4 mg via INTRAMUSCULAR

## 2017-05-15 MED FILL — AMPHETAMINE SALTS 30 MG TAB: 30 | 30 days supply | Qty: 60 | Fill #0

## 2017-05-15 MED FILL — clonazePAM 0.5 MG TABS: 0.5 | 10 days supply | Qty: 20 | Fill #0

## 2017-05-15 MED FILL — LINZESS 290 MCG CAPSULE: 290 | 30 days supply | Qty: 30 | Fill #0

## 2017-05-15 NOTE — Progress Notes (Signed)
Patient with history of migraine headaches complaining of dull, throbbing headache. Pain is right-sided in the frontotemporal area, accompanied by photophobia and phonophobia. Patient states symptoms are consistent witt her usual migraines. Denies vision change, gait disturbance, paresthesias, focal weakness.

## 2017-05-15 NOTE — Telephone Encounter (Signed)
Entered in error. Disregard.

## 2017-05-19 ENCOUNTER — Ambulatory Visit (INDEPENDENT_AMBULATORY_CARE_PROVIDER_SITE_OTHER): Payer: 59 | Admitting: Family Medicine

## 2017-05-19 ENCOUNTER — Encounter: Payer: Self-pay | Admitting: Family Medicine

## 2017-05-19 VITALS — BP 118/73 | HR 88 | Ht 59.0 in | Wt 121.0 lb

## 2017-05-19 DIAGNOSIS — K5900 Constipation, unspecified: Secondary | ICD-10-CM

## 2017-05-19 MED ORDER — LACTULOSE 10 GM/15ML PO SOLN: 10.0000 g | Freq: Three times a day (TID) | ORAL | 12 refills | Status: DC

## 2017-05-19 MED ORDER — BISACODYL 10 MG RE SUPP: 10.0000 mg | RECTAL | 12 refills | Status: DC | PRN

## 2017-05-19 MED ORDER — PEG 3350-KCL-NABCB-NACL-NASULF 236 G PO SOLR: 4000.0000 mL | Freq: Once | ORAL | 0 refills | Status: AC

## 2017-05-19 MED FILL — LACTULOSE 10 GM/15 ML SOLN: 10 | 5 days supply | Qty: 240 | Fill #0

## 2017-05-19 MED FILL — GAVILYTE-G SOLUTION: 236 | 2 days supply | Qty: 4000 | Fill #0

## 2017-05-19 NOTE — Progress Notes (Signed)
Victoria Barnes is a 31 y.o. female who presents to [name]: Primary Care Sports Medicine today for constipation. Patient notes a several day history of worsening constipation. She has a history of intermittent idiopathic constipation. In the past she has had success with over-the-counter medications including MiraLAX. Additionally at some point in the past she was prescribed Linzess but does not take it consistently because she doesn't need it and it's kind of expensive. However over the last week and a half or so she's had much worsening symptoms. She's been using over-the-counter MiraLAX, senna and Colace with little success. She also used a fleets enema and had a normal bowel movement with daughter over the weekend. She notes that she feels bloated and has not had a bowel movement since using the fleets enema. She is a bit nauseated as well but is not vomiting. She denies any medications that may cause constipation. She feels well otherwise.   Past Medical History:  Diagnosis Date  . Eczema 2012   Past Surgical History:  Procedure Laterality Date  . BREAST ENHANCEMENT SURGERY Bilateral June 2015  . WISDOM TOOTH EXTRACTION     Social History  Substance Use Topics  . Smoking status: Former Smoker    Types: Cigarettes    Quit date: 11/2014  . Smokeless tobacco: Never Used     Comment: quit 2 years  . Alcohol use Yes     Comment: once or twice a month - socially   family history includes Depression in her maternal grandmother; Hyperlipidemia in her mother; Mood Disorder in her maternal grandmother; Personality disorder in her maternal grandmother.  ROS as above:  Medications: Current Outpatient Prescriptions  Medication Sig Dispense Refill  . amphetamine-dextroamphetamine (ADDERALL) 30 MG tablet Take 1 tablet by mouth 2 (two) times daily. 60 tablet 0  . Clobetasol Prop Emollient Base  (CLOBETASOL PROPIONATE E) 0.05 % emollient cream Use twice a day only as needed for eczema. 30 g 6  . clonazePAM (KLONOPIN) 0.5 MG tablet Take 1 tablet (0.5 mg total) by mouth 2 (two) times daily as needed for anxiety. Will cause sedation. 20 tablet 1  . fexofenadine (ALLEGRA ALLERGY) 180 MG tablet Take 1 tablet (180 mg total) by mouth daily. 90 tablet 3  . fluocinonide gel (LIDEX) 0.05 % Apply 1 application topically 4 (four) times daily. Use for 1-2 weeks at a time. 60 g 4  . fluticasone (FLONASE) 50 MCG/ACT nasal spray Place 1 spray into both nostrils daily. 15.8 g 2  . Ginkgo Biloba 40 MG TABS Take by mouth.    . Iron-FA-B Cmp-C-Biot-Probiotic (FUSION PLUS) CAPS Take 1 tablet by mouth daily. 30 capsule 11  . Levonorgestrel (SKYLA) 13.5 MG IUD by Intrauterine route.    . linaclotide (LINZESS) 290 MCG CAPS capsule Take 1 capsule (290 mcg total) by mouth daily. 30 capsule 3  . rizatriptan (MAXALT-MLT) 5 MG disintegrating tablet Take 1 tablet (5 mg total) by mouth as needed for migraine. May repeat in 2 hours if needed 10 tablet 3  . tacrolimus (PROTOPIC) 0.1 % ointment Apply topically 2 (two) times daily. Only as needed to eczema spots. 100 g 1  . traZODone (DESYREL) 50 MG tablet Take 0.5-1 tablets (25-50 mg total) by mouth at bedtime as needed for sleep. 30 tablet 2  . valACYclovir (VALTREX) 1000 MG tablet TAKE 2 TABLETS TWICE A DAY FOR ONE DAY FOR OUTBREAK. 8 tablet 0  . Vitamin D, Ergocalciferol, (DRISDOL) 50000  units CAPS capsule Take 1 capsule (50,000 Units total) by mouth every 7 (seven) days. Recheck Vitamin D in 3 Months 12 capsule 0  . bisacodyl (DULCOLAX) 10 MG suppository Place 1 suppository (10 mg total) rectally as needed for moderate constipation. 12 suppository 12  . lactulose (CHRONULAC) 10 GM/15ML solution Take 15 mLs (10 g total) by mouth 3 (three) times daily. 240 mL 12  . polyethylene glycol (GOLYTELY) 236 g solution Take 4,000 mLs by mouth once. 4000 mL 0   No current  facility-administered medications for this visit.    Allergies  Allergen Reactions  . Antacid [Alum & Mag Hydroxide-Simeth] Nausea And Vomiting  . Keflex [Cephalexin] Nausea And Vomiting  . Penicillins Nausea And Vomiting  . Trintellix [Vortioxetine]     Swelling/numbness and tingling of extremities.   Elliot Cousin. Viibryd [Vilazodone Hcl]     Abnormal Frightening Dreams    Health Maintenance Health Maintenance  Topic Date Due  . INFLUENZA VACCINE  06/03/2017  . PAP SMEAR  09/07/2017  . TETANUS/TDAP  09/03/2024  . HIV Screening  Completed     Exam:  BP 118/73   Pulse 88   Ht 4\' 11"  (1.499 m)   Wt 121 lb (54.9 kg)   BMI 24.44 kg/m  Gen: Well NAD HEENT: EOMI,  MMM Lungs: Normal work of breathing. CTABL Heart: RRR no MRG Abd: NABS, Soft. Nondistended, Nontender Exts: Brisk capillary refill, warm and well perfused.    No results found for this or any previous visit (from the past 72 hour(s)). No results found.    Assessment and Plan: 31 y.o. female with Idiopathic constipation. Plan to continue Linzess as well as MiraLAX. We'll add lactulose and Dulcolax suppositories. If not better will use GoLYTELY prep which should work. Recheck if not improved.   No orders of the defined types were placed in this encounter.  Meds ordered this encounter  Medications  . lactulose (CHRONULAC) 10 GM/15ML solution    Sig: Take 15 mLs (10 g total) by mouth 3 (three) times daily.    Dispense:  240 mL    Refill:  12  . bisacodyl (DULCOLAX) 10 MG suppository    Sig: Place 1 suppository (10 mg total) rectally as needed for moderate constipation.    Dispense:  12 suppository    Refill:  12  . polyethylene glycol (GOLYTELY) 236 g solution    Sig: Take 4,000 mLs by mouth once.    Dispense:  4000 mL    Refill:  0     Discussed warning signs or symptoms. Please see discharge instructions. Patient expresses understanding.

## 2017-05-19 NOTE — Patient Instructions (Signed)
Thank you for coming in today. Continue Linzess.  Use Bisacodyl suppository tonight.  Continue miralax  Add Lactulose and or Mag Citrate  If not better take GoLitely  If not better then let me know.

## 2017-05-28 ENCOUNTER — Other Ambulatory Visit: Payer: Self-pay | Admitting: *Deleted

## 2017-05-28 MED ORDER — FLUCONAZOLE 150 MG PO TABS: ORAL_TABLET | ORAL | 0 refills | Status: DC

## 2017-05-28 MED ORDER — VALACYCLOVIR HCL 1 G PO TABS: ORAL_TABLET | ORAL | 0 refills | Status: DC

## 2017-05-28 MED ORDER — FLUCONAZOLE 150 MG PO TABS: ORAL_TABLET | ORAL | 0 refills | Status: AC

## 2017-05-28 MED FILL — FLUCONAZOLE 150 MG TABLET: 150 | 2 days supply | Qty: 2 | Fill #0

## 2017-05-28 MED FILL — valACYclovir HCL 1 GM TABS: 1 | 1 days supply | Qty: 8 | Fill #0

## 2017-06-11 ENCOUNTER — Ambulatory Visit (INDEPENDENT_AMBULATORY_CARE_PROVIDER_SITE_OTHER): Payer: 59 | Admitting: Psychiatry

## 2017-06-11 VITALS — BP 127/88 | HR 99 | Resp 18 | Wt 122.2 lb

## 2017-06-11 DIAGNOSIS — F902 Attention-deficit hyperactivity disorder, combined type: Secondary | ICD-10-CM | POA: Diagnosis not present

## 2017-06-11 DIAGNOSIS — Z5689 Other problems related to employment: Secondary | ICD-10-CM | POA: Diagnosis not present

## 2017-06-11 DIAGNOSIS — Z639 Problem related to primary support group, unspecified: Secondary | ICD-10-CM

## 2017-06-11 DIAGNOSIS — Z818 Family history of other mental and behavioral disorders: Secondary | ICD-10-CM

## 2017-06-11 DIAGNOSIS — Z87891 Personal history of nicotine dependence: Secondary | ICD-10-CM

## 2017-06-11 MED ORDER — AMPHETAMINE-DEXTROAMPHETAMINE 30 MG PO TABS: 30.0000 mg | ORAL_TABLET | Freq: Two times a day (BID) | ORAL | 0 refills | Status: DC

## 2017-06-11 MED ORDER — GUANFACINE HCL ER 1 MG PO TB24: 1.0000 mg | ORAL_TABLET | Freq: Every day | ORAL | 0 refills | Status: DC

## 2017-06-11 MED ORDER — AMPHETAMINE-DEXTROAMPHETAMINE 30 MG PO TABS: 30.0000 mg | ORAL_TABLET | Freq: Every day | ORAL | 0 refills | Status: DC

## 2017-06-11 MED FILL — guanFACINE HCL ER 1 MG TB24: 1 | 90 days supply | Qty: 90 | Fill #0

## 2017-06-11 NOTE — Progress Notes (Signed)
BH MD/PA/NP OP Progress Note  06/11/2017 4:32 PM BRIONNE MERTZ  MRN:  [id number]  Chief Complaint: Romie Jumper   Subjective:  [name] presents for med management. She reports that she has been more adherent to her Adderall, and generally takes it twice a day during the week and once a day on the weekends. He reports that things of been okay with her and her boyfriend and she decided not to break up with him. She continues to harbor guilt about her promiscuity and cheating, and has ended the relationship with the person she was cheating with.  She reports that things at work and the major stressor lately, and she is not about quitting her job. 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 I encouraged her to speak with management, but also to speak directly with the providers to let them know how she is feeling. She reports that she is thinking about quitting, I encouraged her to consider other options such as transferring rather than quitting, given that she has a son she needs to think about in terms of income.  Spent time discussing some medication management options for reducing irritability. She reports that Abilify was helpful for her mood being more stable but made her very tired. We discussed starting Intuniv to reduce her irritability impulsive anger and frustration and reviewed the risks and benefits of this. We agreed to start at 1 mg nightly and increase to 2 mg as tolerated.  I also encouraged her to set up couples therapy and individual therapy in this office. I made a referral for couples therapy.  Visit Diagnosis:    ICD-10-CM   1. Relationship problems Z63.9 Ambulatory referral to Psychology  2. Attention deficit hyperactivity disorder (ADHD), combined type F90.2 amphetamine-dextroamphetamine (ADDERALL) 30 MG tablet    Past Psychiatric History: See intake H&P for full details. Reviewed, with no updates at this  time.  Past Medical History:  Past Medical History:  Diagnosis Date  . Eczema 2012    Past Surgical History:  Procedure Laterality Date  . BREAST ENHANCEMENT SURGERY Bilateral June 2015  . WISDOM TOOTH EXTRACTION      Family Psychiatric History: See intake H&P for full details. Reviewed, with no updates at this time.   Family History:  Family History  Problem Relation Age of Onset  . Hyperlipidemia Mother   . Personality disorder Maternal Grandmother   . Depression Maternal Grandmother   . Mood Disorder Maternal Grandmother     Social History:  Social History   Social History  . Marital status: Single    Spouse name: N/A  . Number of children: N/A  . Years of education: N/A   Occupational History  . CMA    Social History Main Topics  . Smoking status: Former Smoker    Types: Cigarettes    Quit date: 11/2014  . Smokeless tobacco: Never Used     Comment: quit 2 years  . Alcohol use Yes     Comment: once or twice a month - socially  . Drug use: No  . Sexual activity: Yes    Partners: Male    Birth control/ protection: IUD     Comment: condoms   Other Topics Concern  . Not on file   Social History Narrative  . No narrative on file    Allergies:  Allergies  Allergen Reactions  . Antacid [Alum & Mag Hydroxide-Simeth] Nausea And Vomiting  .  Keflex [Cephalexin] Nausea And Vomiting  . Penicillins Nausea And Vomiting  . Trintellix [Vortioxetine]     Swelling/numbness and tingling of extremities.   Elliot Cousin Hcl]     Abnormal Frightening Dreams    Metabolic Disorder Labs: No results found for: HGBA1C, MPG No results found for: PROLACTIN Lab Results  Component Value Date   CHOL 188 09/08/2016   TRIG 30 09/08/2016   HDL 71 09/08/2016   CHOLHDL 2.6 09/08/2016   VLDL 6 09/08/2016   LDLCALC 111 (H) 09/08/2016   LDLCALC 89 06/18/2015     Current Medications: Current Outpatient Prescriptions  Medication Sig Dispense Refill  .  amphetamine-dextroamphetamine (ADDERALL) 30 MG tablet Take 1 tablet by mouth 2 (two) times daily. 60 tablet 0  . amphetamine-dextroamphetamine (ADDERALL) 30 MG tablet Take 1 tablet by mouth 2 (two) times daily. 60 tablet 0  . bisacodyl (DULCOLAX) 10 MG suppository Place 1 suppository (10 mg total) rectally as needed for moderate constipation. 12 suppository 12  . Clobetasol Prop Emollient Base (CLOBETASOL PROPIONATE E) 0.05 % emollient cream Use twice a day only as needed for eczema. 30 g 6  . clonazePAM (KLONOPIN) 0.5 MG tablet Take 1 tablet (0.5 mg total) by mouth 2 (two) times daily as needed for anxiety. Will cause sedation. 20 tablet 1  . fexofenadine (ALLEGRA ALLERGY) 180 MG tablet Take 1 tablet (180 mg total) by mouth daily. 90 tablet 3  . fluconazole (DIFLUCAN) 150 MG tablet Take one tab, may take second tab if no improvement after 72 hours. 2 tablet 0  . fluocinonide gel (LIDEX) 0.05 % Apply 1 application topically 4 (four) times daily. Use for 1-2 weeks at a time. 60 g 4  . fluticasone (FLONASE) 50 MCG/ACT nasal spray Place 1 spray into both nostrils daily. 15.8 g 2  . Ginkgo Biloba 40 MG TABS Take by mouth.    . guanFACINE (INTUNIV) 1 MG TB24 ER tablet Take 1 tablet (1 mg total) by mouth daily. 90 tablet 0  . Iron-FA-B Cmp-C-Biot-Probiotic (FUSION PLUS) CAPS Take 1 tablet by mouth daily. 30 capsule 11  . lactulose (CHRONULAC) 10 GM/15ML solution Take 15 mLs (10 g total) by mouth 3 (three) times daily. 240 mL 12  . Levonorgestrel (SKYLA) 13.5 MG IUD by Intrauterine route.    . linaclotide (LINZESS) 290 MCG CAPS capsule Take 1 capsule (290 mcg total) by mouth daily. 30 capsule 3  . rizatriptan (MAXALT-MLT) 5 MG disintegrating tablet Take 1 tablet (5 mg total) by mouth as needed for migraine. May repeat in 2 hours if needed 10 tablet 3  . tacrolimus (PROTOPIC) 0.1 % ointment Apply topically 2 (two) times daily. Only as needed to eczema spots. 100 g 1  . traZODone (DESYREL) 50 MG tablet Take  0.5-1 tablets (25-50 mg total) by mouth at bedtime as needed for sleep. 30 tablet 2  . valACYclovir (VALTREX) 1000 MG tablet TAKE 2 TABLETS TWICE A DAY FOR ONE DAY FOR OUTBREAK. 8 tablet 0  . Vitamin D, Ergocalciferol, (DRISDOL) 50000 units CAPS capsule Take 1 capsule (50,000 Units total) by mouth every 7 (seven) days. Recheck Vitamin D in 3 Months 12 capsule 0   No current facility-administered medications for this visit.     Neurologic: Headache: Negative Seizure: Negative Paresthesias: Negative  Musculoskeletal: Strength & Muscle Tone: within normal limits Gait & Station: normal Patient leans: N/A  Psychiatric Specialty Exam: ROS  Blood pressure 127/88, pulse 99, resp. rate 18, weight 122 lb 3.2 oz (55.4 kg).Body  mass index is 24.68 kg/m.  General Appearance: Casual and Well Groomed  Eye Contact:  Good  Speech:  Clear and Coherent  Volume:  Normal  Mood:  Irritable  Affect:  Congruent  Thought Process:  Coherent and Goal Directed  Orientation:  Full (Time, Place, and Person)  Thought Content: Logical   Suicidal Thoughts:  No  Homicidal Thoughts:  No  Memory:  Immediate;   Good  Judgement:  Fair  Insight:  Fair  Psychomotor Activity:  Normal  Concentration:  Concentration: Fair  Recall:  Fair  Fund of Knowledge: Fair  Language: Good  Akathisia:  Negative  Handed:  Right  AIMS (if indicated):  0  Assets:  Communication Skills Desire for Improvement Financial Resources/Insurance Housing Intimacy Leisure Time Physical Health Resilience Social Support Talents/Skills Transportation Vocational/Educational  ADL's:  Intact  Cognition: WNL  Sleep:  7-9 hours    Treatment Plan Summary: Festus Holtsbony T Mackiewicz is a 31 year old female with a history of ADHD, and mood symptoms concerning for bipolar spectrum illness vs uncontrolled ADHD with significant impulsivity. She continues to struggle with irritability, especially in relation to interpersonal difficulties. As I gotten  to know her, some of her poor coping strategies and issues of self-esteem become more evident. My suspicion of bipolar illness is diminishing. I believe she would benefit from augmenting Adderall with Intuniv at a low-dose to reduce impulsive irritability and potentially improve frustration. She does not have any acute safety issues and can follow-up with writer in 3 months  1. Relationship problems   2. Attention deficit hyperactivity disorder (ADHD), combined type      Continue Adderall 30 mg twice daily in the morning and after lunch Initiate Intuniv 1 mg, increase to 2 mg as started Continue trazodone 50 mg nightly when necessary for sleep If she continues with mood lability, restart Abilify 2 mg Follow-up in 12 weeks  Burnard LeighAlexander Arya Eksir, MD 06/11/2017, 4:32 PM

## 2017-06-22 MED FILL — DEXTROAMP-AMPHETAMIN 30 MG: 30 | 30 days supply | Qty: 60 | Fill #0

## 2017-06-22 MED FILL — PREVIDENT 5000 BOOSTER PLUS: 1.1 | 30 days supply | Qty: 100 | Fill #0

## 2017-07-02 ENCOUNTER — Ambulatory Visit (INDEPENDENT_AMBULATORY_CARE_PROVIDER_SITE_OTHER): Payer: 59 | Admitting: Physician Assistant

## 2017-07-02 ENCOUNTER — Encounter: Payer: Self-pay | Admitting: Physician Assistant

## 2017-07-02 VITALS — BP 121/69 | HR 98 | Wt 117.0 lb

## 2017-07-02 DIAGNOSIS — F3162 Bipolar disorder, current episode mixed, moderate: Secondary | ICD-10-CM | POA: Diagnosis not present

## 2017-07-02 DIAGNOSIS — F319 Bipolar disorder, unspecified: Secondary | ICD-10-CM | POA: Diagnosis not present

## 2017-07-02 MED ORDER — LAMOTRIGINE 25 MG PO TABS: 25.0000 mg | ORAL_TABLET | Freq: Every day | ORAL | 1 refills | Status: DC

## 2017-07-02 MED FILL — lamoTRIgine 25 MG TABS: 25 | 30 days supply | Qty: 30 | Fill #0

## 2017-07-02 NOTE — Patient Instructions (Addendum)
-   Start Lamictal 25 mg daily. Plan to stay at this dose for 2 weeks. Will slowly titrate up - Prescription for 25 minutes per day of walking or exercise of choice - Set small goals for yourself each day such as getting out of bed before noon, getting dressed, etc. - Contact [facility] for counseling - Limit time spent alone. When alone, consider mindful activities like coloring, music therapy, journaling, or guided meditation - Close follow-up with me or Jade in 1 week  If you experience any worsening suicidal thoughts, enact your safety plan or go to the nearest emergency room or call 911 National Suicide Hotline 1-800-SUICIDE  [facility] Crisis Hotline 914-855-0704304-218-0111  Resources: [employer] - free online counseling/chat [facility] (instagram)

## 2017-07-02 NOTE — Progress Notes (Signed)
HPI:                                                                Victoria Barnes is a 31 y.o. female who presents to [name]: Primary Care Sports Medicine today for mood disorder  Patient with PMH ADHD, depression, bipolar I disorder presents today for depressed mood. She is followed by Dr. Angus Palms, [facility], but reports her psychiatrist does not have availability until November. She was diagnosed with Bipolar spectrum disorder by psychiatry in April and started on Abilify. At most recent visit on 06/11/17 psychiatrist recommended starting Intuniv to help with impulsivity / uncontrolled ADHD.  Patient self-discontinued her Abilify in early May due to sedation/hypersomnolence. She did feel it helped with mood lability. She never started Intuniv. Reports she has been doing well since that time until this week. Reports sudden onset depressed mood and anhedonia. Additionally reports the impulse to be hypersexual and spend money, but has been coping with these urges. States she did not sleep for 2 days, but was able to take Trazodone and sleep for a few hours last night. Denies euphoria or mania. Reports feeling like there is no point in living, but denies suicidal ideation or plans to harm herself. She is currently on FMLA from her job due to symptoms interfering with ability to perform her duties. She also reports that work is a major stressor for her due to interpersonal conflicts with one of the providers. Reports feeling anxiety every day before work.  Previous medications: - Fluoxetine, Escitalopram, Viibryd, Trintellix, Abilify No history of hospitalizations No history of self harm Counselor: Ottis Stain, plans to transition care to new counselor  Past Medical History:  Diagnosis Date  . Eczema 2012   Past Surgical History:  Procedure Laterality Date  . BREAST ENHANCEMENT SURGERY Bilateral June 2015  . WISDOM TOOTH EXTRACTION     Social History  Substance  Use Topics  . Smoking status: Former Smoker    Types: Cigarettes    Quit date: 11/2014  . Smokeless tobacco: Never Used     Comment: quit 2 years  . Alcohol use Yes     Comment: once or twice a month - socially   family history includes Depression in her maternal grandmother; Hyperlipidemia in her mother; Mood Disorder in her maternal grandmother; Personality disorder in her maternal grandmother.  ROS: negative except as noted in the HPI  Medications: Current Outpatient Prescriptions  Medication Sig Dispense Refill  . amphetamine-dextroamphetamine (ADDERALL) 30 MG tablet Take 1 tablet by mouth 2 (two) times daily. 60 tablet 0  . amphetamine-dextroamphetamine (ADDERALL) 30 MG tablet Take 1 tablet by mouth 2 (two) times daily. 60 tablet 0  . Clobetasol Prop Emollient Base (CLOBETASOL PROPIONATE E) 0.05 % emollient cream Use twice a day only as needed for eczema. 30 g 6  . clonazePAM (KLONOPIN) 0.5 MG tablet Take 1 tablet (0.5 mg total) by mouth 2 (two) times daily as needed for anxiety. Will cause sedation. 20 tablet 1  . fexofenadine (ALLEGRA ALLERGY) 180 MG tablet Take 1 tablet (180 mg total) by mouth daily. 90 tablet 3  . fluticasone (FLONASE) 50 MCG/ACT nasal spray Place 1 spray into both nostrils daily. 15.8 g 2  . Ginkgo Biloba 40 MG  TABS Take by mouth.    . guanFACINE (INTUNIV) 1 MG TB24 ER tablet Take 1 tablet (1 mg total) by mouth daily. 90 tablet 0  . Iron-FA-B Cmp-C-Biot-Probiotic (FUSION PLUS) CAPS Take 1 tablet by mouth daily. 30 capsule 11  . lamoTRIgine (LAMICTAL) 25 MG tablet Take 1 tablet (25 mg total) by mouth daily. 30 tablet 1  . Levonorgestrel (SKYLA) 13.5 MG IUD by Intrauterine route.    . linaclotide (LINZESS) 290 MCG CAPS capsule Take 1 capsule (290 mcg total) by mouth daily. 30 capsule 3  . PREVIDENT 5000 BOOSTER PLUS 1.1 % PSTE   12  . rizatriptan (MAXALT-MLT) 5 MG disintegrating tablet Take 1 tablet (5 mg total) by mouth as needed for migraine. May repeat in 2  hours if needed 10 tablet 3  . tacrolimus (PROTOPIC) 0.1 % ointment Apply topically 2 (two) times daily. Only as needed to eczema spots. 100 g 1  . traZODone (DESYREL) 50 MG tablet Take 0.5-1 tablets (25-50 mg total) by mouth at bedtime as needed for sleep. 30 tablet 2  . valACYclovir (VALTREX) 1000 MG tablet TAKE 2 TABLETS TWICE A DAY FOR ONE DAY FOR OUTBREAK. 8 tablet 0   No current facility-administered medications for this visit.    Allergies  Allergen Reactions  . Antacid [Alum & Mag Hydroxide-Simeth] Nausea And Vomiting  . Keflex [Cephalexin] Nausea And Vomiting  . Penicillins Nausea And Vomiting  . Trintellix [Vortioxetine]     Swelling/numbness and tingling of extremities.   Elliot Cousin. Viibryd [Vilazodone Hcl]     Abnormal Frightening Dreams       Objective:  BP 121/69   Pulse 98   Wt 117 lb (53.1 kg)   SpO2 100%   BMI 23.63 kg/m  Gen:  alert, not ill-appearing, no distress, appropriate for age HEENT: head normocephalic without obvious abnormality, conjunctiva and cornea clear, trachea midline Pulm: Normal work of breathing, normal phonation Neuro: alert and oriented x 3, no tremor MSK: extremities atraumatic, normal gait and station Skin: intact, no rashes on exposed skin, no jaundice, no cyanosis Psych: appearance casual, wearing sunglasses, cooperative, depressed mood, affect mood-congruent, tearful in the exam room, speech is articulate, and thought processes clear and goal-directed, no SI/HI   No results found for this or any previous visit (from the past 72 hour(s)). No results found.    Assessment and Plan: 31 y.o. female with   1. Bipolar 1 disorder (HCC) - Currently uncontrolled without mood stabilizer. Reviewed psychiatry notes from 04/07/2017, which recommended trial of Lamictal if patient fails Abilify. Due to history of mania, will continue to avoid SSRI's/SNRI's.  - no acute safety issues - Plan to start at 25mg  daily for 2 weeks and titrate to lowest  effective dose.  - cont Trazodone 50mg  QHS prn - Close follow-up in 1 week - Recommended patient contact [facility] for counseling - lamoTRIgine (LAMICTAL) 25 MG tablet; Take 1 tablet (25 mg total) by mouth daily.  Dispense: 30 tablet; Refill: 1  2. Mixed bipolar affective disorder, moderate (HCC) - lamoTRIgine (LAMICTAL) 25 MG tablet; Take 1 tablet (25 mg total) by mouth daily.  Dispense: 30 tablet; Refill: 1   Patient education and anticipatory guidance given Patient agrees with treatment plan Follow-up in 1 week or sooner as needed if symptoms worsen or fail to improve  Levonne Hubertharley E. Cummings PA-C

## 2017-07-07 ENCOUNTER — Encounter: Payer: Self-pay | Admitting: Physician Assistant

## 2017-07-07 ENCOUNTER — Encounter (HOSPITAL_COMMUNITY): Payer: Self-pay | Admitting: Psychiatry

## 2017-07-07 ENCOUNTER — Ambulatory Visit (HOSPITAL_COMMUNITY): Payer: Self-pay | Admitting: Licensed Clinical Social Worker

## 2017-07-07 DIAGNOSIS — F3181 Bipolar II disorder: Secondary | ICD-10-CM | POA: Insufficient documentation

## 2017-07-10 ENCOUNTER — Encounter: Payer: Self-pay | Admitting: Physician Assistant

## 2017-07-10 ENCOUNTER — Encounter: Payer: 59 | Admitting: Physician Assistant

## 2017-07-10 NOTE — Progress Notes (Signed)
   Subjective:    Patient ID: Victoria Barnes, female    DOB: 1986-05-15, 31 y.o.   MRN: [id number]  HPI erron encounter   Review of Systems     Objective:   Physical Exam        Assessment & Plan:

## 2017-07-13 ENCOUNTER — Ambulatory Visit: Payer: Self-pay | Admitting: Physician Assistant

## 2017-07-14 ENCOUNTER — Ambulatory Visit (INDEPENDENT_AMBULATORY_CARE_PROVIDER_SITE_OTHER): Payer: 59 | Admitting: Physician Assistant

## 2017-07-14 ENCOUNTER — Encounter: Payer: Self-pay | Admitting: Physician Assistant

## 2017-07-14 VITALS — BP 105/64 | HR 74 | Wt 121.0 lb

## 2017-07-14 DIAGNOSIS — F319 Bipolar disorder, unspecified: Secondary | ICD-10-CM

## 2017-07-14 NOTE — Progress Notes (Signed)
   Subjective:    Patient ID: Victoria Barnes, female    DOB: July 02, 1986, 31 y.o.   MRN: [id number]  HPI  Pt is following up on lamictal start 2 weeks ago for bipolar. She has been off abilify for months due to no energy while taking medication. She thoughts she was doing ok off all medications and then she became really depressed. She was then started on lamictal by another provider. She does feel like it is helping some but on at the 25mg  dose. She has seen employee counseling and has another counselor set up for 20th of this month. She denies any suicidal thoughts. She actually feels like she could be going into more of a mania phase at this time. She is needing less sleep and seems very motivated for change.     Review of Systems  All other systems reviewed and are negative.      Objective:   Physical Exam  Constitutional: She appears well-developed and well-nourished.  Cardiovascular: Normal rate, regular rhythm and normal heart sounds.   Psychiatric: She has a normal mood and affect. Her behavior is normal.          Assessment & Plan:  [name].[name].Victoria Barnes was seen today for follow-up.  Diagnoses and all orders for this visit:  Bipolar 1 disorder (HCC)   .[name]. [facility] 07/15/2017 06/17/2016 09/07/2014  Decreased Interest 2 3 3   Down, Depressed, Hopeless 2 3 2   PHQ - 2 Score 4 6 5   Altered sleeping 1 3 0  Tired, decreased energy 2 3 3   Change in appetite 1 3 0  Feeling bad or failure about yourself  1 3 0  Trouble concentrating 2 3 3   Moving slowly or fidgety/restless 1 3 0  Suicidal thoughts 0 3 0  PHQ-9 Score 12 27 11   Difficult doing work/chores Somewhat difficult - -  Some encounter information is confidential and restricted. Go to Review Flowsheets activity to see all data.   Pt has improved on lamictal with little to no side effects.  Will continue as monotherapy.  Increase by 25mg  weekly until at 100mg .  Follow up in 4 weeks or sooner if having any  problems.  Continue with counseling appt and psychiatrist in November.

## 2017-07-14 NOTE — Patient Instructions (Signed)
Increase lamictal to  and then in one week increast to .

## 2017-07-15 ENCOUNTER — Encounter: Payer: Self-pay | Admitting: Physician Assistant

## 2017-07-15 ENCOUNTER — Other Ambulatory Visit: Payer: Self-pay

## 2017-07-15 MED ORDER — FLUCONAZOLE 150 MG PO TABS: 150.0000 mg | ORAL_TABLET | Freq: Once | ORAL | 11 refills | Status: AC

## 2017-07-15 MED ORDER — TRAZODONE HCL 50 MG PO TABS: 25.0000 mg | ORAL_TABLET | Freq: Every evening | ORAL | 6 refills | Status: DC | PRN

## 2017-07-15 MED FILL — FUSION PLUS CAPSULE: 30 days supply | Qty: 30 | Fill #1

## 2017-07-15 MED FILL — FLUCONAZOLE 150 MG TABLET: 150 | 3 days supply | Qty: 2 | Fill #0 | Status: TO

## 2017-07-15 MED FILL — clonazePAM 0.5 MG TABS: 0.5 | 10 days supply | Qty: 20 | Fill #1

## 2017-07-15 MED FILL — traZODone HCL 50 MG TABS: 50 | 30 days supply | Qty: 30 | Fill #0

## 2017-07-15 NOTE — Telephone Encounter (Signed)
Patient request refill for Diflucan and Trazodone. Victoria Barnes,CMA

## 2017-07-20 ENCOUNTER — Other Ambulatory Visit: Payer: Self-pay | Admitting: *Deleted

## 2017-07-20 DIAGNOSIS — F319 Bipolar disorder, unspecified: Secondary | ICD-10-CM

## 2017-07-20 DIAGNOSIS — F3162 Bipolar disorder, current episode mixed, moderate: Secondary | ICD-10-CM

## 2017-07-20 MED ORDER — LAMOTRIGINE 25 MG PO TABS: 75.0000 mg | ORAL_TABLET | Freq: Every day | ORAL | 0 refills | Status: DC

## 2017-07-21 ENCOUNTER — Ambulatory Visit (HOSPITAL_COMMUNITY): Payer: Self-pay | Admitting: Licensed Clinical Social Worker

## 2017-07-21 MED FILL — lamoTRIgine 25 MG TABS: 25 | 30 days supply | Qty: 90 | Fill #0

## 2017-07-23 ENCOUNTER — Ambulatory Visit (HOSPITAL_COMMUNITY): Payer: Self-pay | Admitting: Licensed Clinical Social Worker

## 2017-08-04 MED FILL — AMPHETAMINE SALTS 30 MG TAB: 30 | 30 days supply | Qty: 60 | Fill #0

## 2017-08-06 ENCOUNTER — Encounter: Payer: Self-pay | Admitting: Physician Assistant

## 2017-08-06 ENCOUNTER — Ambulatory Visit (INDEPENDENT_AMBULATORY_CARE_PROVIDER_SITE_OTHER): Payer: 59 | Admitting: Physician Assistant

## 2017-08-06 ENCOUNTER — Telehealth (HOSPITAL_COMMUNITY): Payer: Self-pay

## 2017-08-06 VITALS — BP 110/73 | HR 75

## 2017-08-06 DIAGNOSIS — L2084 Intrinsic (allergic) eczema: Secondary | ICD-10-CM | POA: Diagnosis not present

## 2017-08-06 DIAGNOSIS — F319 Bipolar disorder, unspecified: Secondary | ICD-10-CM | POA: Diagnosis not present

## 2017-08-06 MED ORDER — FLUOXETINE HCL 10 MG PO TABS: 10.0000 mg | ORAL_TABLET | Freq: Every day | ORAL | 3 refills | Status: DC

## 2017-08-06 MED ORDER — TACROLIMUS 0.1 % EX OINT: TOPICAL_OINTMENT | Freq: Two times a day (BID) | CUTANEOUS | 0 refills | Status: DC

## 2017-08-06 NOTE — Progress Notes (Signed)
HPI:                                                                Victoria Barnes is a 31 y.o. female who presents to [facility]: [facility] Care [facility] Medicine today for eczema and depression  [name] with PMH of ADHD and Bipolar I disorder presents today for depressed mood. Currently on Lamictal for approximately 4 weeks. Recently titrated up to 100 mg 3 days ago. Reports she does not feel medication is helping her depression or stabilizing her mood. Reports decreased need for sleep and increase in impulsive behavior in addition to feeling "like I don't want to live anymore." Denies self-harm or plan for suicide. States "I would not hurt myself." She reports that she contacted her psychiatrist and cannot be seen in until mid-November.  She is currently seeing a counselor and participating in her employer's EACP program to address triggers.  Requesting refill of her eczema medication. Reports eczema flare of her bilateral wrists.   Past Medical History:  Diagnosis Date  . ADHD   . Bipolar I disorder (HCC)   . Depression   . Eczema 2012  . Migraine without status migrainosus, not intractable 02/26/2016   Past Surgical History:  Procedure Laterality Date  . BREAST ENHANCEMENT SURGERY Bilateral June 2015  . WISDOM TOOTH EXTRACTION     Social History  Substance Use Topics  . Smoking status: Former Smoker    Types: Cigarettes    Quit date: 11/2014  . Smokeless tobacco: Never Used     Comment: quit 2 years  . Alcohol use Yes     Comment: once or twice a month - socially   family history includes Depression in her maternal grandmother; Hyperlipidemia in her mother; Mood Disorder in her maternal grandmother; Personality disorder in her maternal grandmother.  ROS: negative except as noted in the HPI  Medications: Current Outpatient Prescriptions  Medication Sig Dispense Refill  . amphetamine-dextroamphetamine (ADDERALL) 30 MG tablet Take 1 tablet by  mouth 2 (two) times daily. 60 tablet 0  . amphetamine-dextroamphetamine (ADDERALL) 30 MG tablet Take 1 tablet by mouth 2 (two) times daily. 60 tablet 0  . Clobetasol Prop Emollient Base (CLOBETASOL PROPIONATE E) 0.05 % emollient cream Use twice a day only as needed for eczema. 30 g 6  . clonazePAM (KLONOPIN) 0.5 MG tablet Take 1 tablet (0.5 mg total) by mouth 2 (two) times daily as needed for anxiety. Will cause sedation. 20 tablet 1  . fexofenadine (ALLEGRA ALLERGY) 180 MG tablet Take 1 tablet (180 mg total) by mouth daily. 90 tablet 3  . fluticasone (FLONASE) 50 MCG/ACT nasal spray Place 1 spray into both nostrils daily. 15.8 g 2  . Ginkgo Biloba 40 MG TABS Take by mouth.    . Iron-FA-B Cmp-C-Biot-Probiotic (FUSION PLUS) CAPS Take 1 tablet by mouth daily. 30 capsule 11  . lamoTRIgine (LAMICTAL) 25 MG tablet Take 3 tablets (75 mg total) by mouth daily. 90 tablet 0  . Levonorgestrel (SKYLA) 13.5 MG IUD by Intrauterine route.    . linaclotide (LINZESS) 290 MCG CAPS capsule Take 1 capsule (290 mcg total) by mouth daily. 30 capsule 3  . PREVIDENT 5000 BOOSTER PLUS 1.1 % PSTE   12  .  rizatriptan (MAXALT-MLT) 5 MG disintegrating tablet Take 1 tablet (5 mg total) by mouth as needed for migraine. May repeat in 2 hours if needed 10 tablet 3  . tacrolimus (PROTOPIC) 0.1 % ointment Apply topically 2 (two) times daily. Only as needed to eczema spots. 100 g 0  . traZODone (DESYREL) 50 MG tablet Take 0.5-1 tablets (25-50 mg total) by mouth at bedtime as needed for sleep. 30 tablet 6  . valACYclovir (VALTREX) 1000 MG tablet TAKE 2 TABLETS TWICE A DAY FOR ONE DAY FOR OUTBREAK. 8 tablet 0  . FLUoxetine (PROZAC) 10 MG tablet Take 1 tablet (10 mg total) by mouth daily. 30 tablet 3   No current facility-administered medications for this visit.    Allergies  Allergen Reactions  . Antacid [Alum & Mag Hydroxide-Simeth] Nausea And Vomiting  . Keflex [Cephalexin] Nausea And Vomiting  . Penicillins Nausea And  Vomiting  . Trintellix [Vortioxetine]     Swelling/numbness and tingling of extremities.   Victoria Barnes]     Abnormal Frightening Dreams       Objective:  BP 110/73   Pulse 75  Gen:  alert, not ill-appearing, no distress, appropriate for age HEENT: head normocephalic without obvious abnormality, conjunctiva and cornea clear, trachea midline Pulm: Normal work of breathing, normal phonation Neuro: alert and oriented x 3, no tremor MSK: extremities atraumatic, normal gait and station Skin: intact, xerosis of bilateral wrists Psych: well-groomed, cooperative, good eye contact, depressed mood, affect mood-congruent, speech is articulate, and thought processes clear and goal-directed    No results found for this or any previous visit (from the past 72 hour(s)). No results found.    Assessment and Plan: 31 y.o. female with   1. Intrinsic eczema - tacrolimus (PROTOPIC) 0.1 % ointment; Apply topically 2 (two) times daily. Only as needed to eczema spots.  Dispense: 100 g; Refill: 0  2. Bipolar 1 disorder (HCC) - continue Lamictal 100 mg daily. Augmenting with low-dose Fluoxetine. Close follow-up with PCP in 1 week - no acute safety issues. Patient contracted for safety - called Dr. Unice Barnes office and attempting to get patient an appointment sooner - FLUoxetine (PROZAC) 10 MG tablet; Take 1 tablet (10 mg total) by mouth daily.  Dispense: 30 tablet; Refill: 3  Patient education and anticipatory guidance given Patient agrees with treatment plan Follow-up in 1 week or sooner as needed if symptoms worsen or fail to improve  Victoria Hubert PA-C

## 2017-08-06 NOTE — Telephone Encounter (Signed)
Provider Rosita Kea called me today and asked that patient be scheduled earlier than November. Patient is no longer on Abilify and Charlie started her on Lamictal in August, patient came in today and said it was not working. Patient is having passive SI with no plan or intent. I have put her in for tomorrow morning and just wanted to give you a heads up.

## 2017-08-06 NOTE — Patient Instructions (Addendum)
-   Continue Lamictal 100 mg daily - Start low-dose Fluoxetine daily - I have contacted your psychiatrist to get you in sooner  - Follow-up with your counselor weekly or an online counselor such as [employer] or Chief Financial Officer Plan: 1. [facility] 2.  3. Our Office [phone] 4. [facility] Crisis Hotline [phone] 5. National Suicide Hotline 1-800-SUICIDE 6. If in immediate danger of harming yourself, go to the nearest emergency room or call 911

## 2017-08-07 ENCOUNTER — Ambulatory Visit (INDEPENDENT_AMBULATORY_CARE_PROVIDER_SITE_OTHER): Payer: 59 | Admitting: Psychiatry

## 2017-08-07 ENCOUNTER — Encounter (HOSPITAL_COMMUNITY): Payer: Self-pay | Admitting: Psychiatry

## 2017-08-07 DIAGNOSIS — Z818 Family history of other mental and behavioral disorders: Secondary | ICD-10-CM | POA: Diagnosis not present

## 2017-08-07 DIAGNOSIS — F909 Attention-deficit hyperactivity disorder, unspecified type: Secondary | ICD-10-CM

## 2017-08-07 DIAGNOSIS — Z79899 Other long term (current) drug therapy: Secondary | ICD-10-CM | POA: Diagnosis not present

## 2017-08-07 DIAGNOSIS — G4700 Insomnia, unspecified: Secondary | ICD-10-CM | POA: Diagnosis not present

## 2017-08-07 DIAGNOSIS — F319 Bipolar disorder, unspecified: Secondary | ICD-10-CM

## 2017-08-07 DIAGNOSIS — Z87891 Personal history of nicotine dependence: Secondary | ICD-10-CM

## 2017-08-07 DIAGNOSIS — F3162 Bipolar disorder, current episode mixed, moderate: Secondary | ICD-10-CM | POA: Diagnosis not present

## 2017-08-07 MED ORDER — LAMOTRIGINE 100 MG PO TABS: 100.0000 mg | ORAL_TABLET | Freq: Every day | ORAL | 0 refills | Status: DC

## 2017-08-07 MED ORDER — BREXPIPRAZOLE 1 MG PO TABS: 1.0000 mg | ORAL_TABLET | Freq: Every day | ORAL | 2 refills | Status: DC

## 2017-08-07 MED FILL — lamoTRIgine 100 MG TABS: 100 | 90 days supply | Qty: 180 | Fill #0

## 2017-08-07 NOTE — Progress Notes (Signed)
BH MD/PA/NP OP Progress Note  08/07/2017 11:54 AM Victoria Barnes  MRN:  [id number]  Chief Complaint: mood problems  HPI: Victoria Barnes presents for sooner follow-up visit because of increased reckless behaviors, passive suicidal thoughts, mixed anxiety and depressive symptoms.  She has had a decreased need for sleep, generally getting about 2-3 hours at night. This is in the context of taking trazodone as well. She reports that she has not been taking her Adderall for the past few weeks because her thinking has been hyped up.  She reports that she has been more promiscuous, spending more, and is ashamed to say that she's been cheating again. She reports that she has racing thoughts, and it's difficult to calm them down especially in the evening. She denies any intentions or plans to harm herself, and reports that when her mood is worse she has nihilistic thoughts that there is no point or purpose to life. She reports that the Lamictal has been well tolerated, and she denies any skin rash. I educated her on SJS, and we agreed to titrate up as discussed.  We also agreed to start a low dose of Rexulti 0.5 mg 1 week, then 1 mg nightly 1 week. She had had some restlessness and sedation with aripiprazole, and refers to avoid Abilify if possible. She has a follow-up with this writer in 4 weeks and individual therapy intake on Monday.  Visit Diagnosis:    ICD-10-CM   1. Bipolar 1 disorder (HCC) F31.9 lamoTRIgine (LAMICTAL) 100 MG tablet  2. Mixed bipolar affective disorder, moderate (HCC) F31.62 lamoTRIgine (LAMICTAL) 100 MG tablet    Brexpiprazole (REXULTI) 1 MG TABS    Past Psychiatric History: See intake H&P for full details. Reviewed, with no updates at this time.   Past Medical History:  Past Medical History:  Diagnosis Date  . ADHD   . Bipolar I disorder (HCC)   . Depression   . Eczema 2012  . Migraine without status migrainosus, not intractable 02/26/2016    Past Surgical History:   Procedure Laterality Date  . BREAST ENHANCEMENT SURGERY Bilateral June 2015  . WISDOM TOOTH EXTRACTION      Family Psychiatric History: See intake H&P for full details. Reviewed, with no updates at this time.   Family History:  Family History  Problem Relation Age of Onset  . Hyperlipidemia Mother   . Personality disorder Maternal Grandmother   . Depression Maternal Grandmother   . Mood Disorder Maternal Grandmother     Social History:  Social History   Social History  . Marital status: Single    Spouse name: N/A  . Number of children: N/A  . Years of education: N/A   Occupational History  . CMA    Social History Main Topics  . Smoking status: Former Smoker    Types: Cigarettes    Quit date: 11/2014  . Smokeless tobacco: Never Used     Comment: quit 2 years  . Alcohol use Yes     Comment: once or twice a month - socially  . Drug use: No  . Sexual activity: Yes    Partners: Male    Birth control/ protection: IUD     Comment: condoms   Other Topics Concern  . None   Social History Narrative  . None    Allergies:  Allergies  Allergen Reactions  . Antacid [Alum & Mag Hydroxide-Simeth] Nausea And Vomiting  . Keflex [Cephalexin] Nausea And Vomiting  . Penicillins Nausea And Vomiting  .  Trintellix [Vortioxetine]     Swelling/numbness and tingling of extremities.   Elliot Cousin [facility]]     Abnormal Frightening Dreams    Metabolic Disorder Labs: No results found for: HGBA1C, MPG No results found for: PROLACTIN Lab Results  Component Value Date   CHOL 188 09/08/2016   TRIG 30 09/08/2016   HDL 71 09/08/2016   CHOLHDL 2.6 09/08/2016   VLDL 6 09/08/2016   LDLCALC 111 (H) 09/08/2016   LDLCALC 89 06/18/2015   Lab Results  Component Value Date   TSH 2.06 02/23/2017   TSH 1.01 09/08/2016    Therapeutic Level Labs: No results found for: LITHIUM No results found for: VALPROATE No components found for:  CBMZ  Current Medications: Current  Outpatient Prescriptions  Medication Sig Dispense Refill  . amphetamine-dextroamphetamine (ADDERALL) 30 MG tablet Take 1 tablet by mouth 2 (two) times daily. 60 tablet 0  . amphetamine-dextroamphetamine (ADDERALL) 30 MG tablet Take 1 tablet by mouth 2 (two) times daily. 60 tablet 0  . Brexpiprazole (REXULTI) 1 MG TABS Take 1 tablet (1 mg total) by mouth at bedtime. 30 tablet 2  . Clobetasol Prop Emollient Base (CLOBETASOL PROPIONATE E) 0.05 % emollient cream Use twice a day only as needed for eczema. 30 g 6  . clonazePAM (KLONOPIN) 0.5 MG tablet Take 1 tablet (0.5 mg total) by mouth 2 (two) times daily as needed for anxiety. Will cause sedation. 20 tablet 1  . fexofenadine (ALLEGRA ALLERGY) 180 MG tablet Take 1 tablet (180 mg total) by mouth daily. 90 tablet 3  . FLUoxetine (PROZAC) 10 MG tablet Take 1 tablet (10 mg total) by mouth daily. 30 tablet 3  . fluticasone (FLONASE) 50 MCG/ACT nasal spray Place 1 spray into both nostrils daily. 15.8 g 2  . Ginkgo Biloba 40 MG TABS Take by mouth.    . Iron-FA-B Cmp-C-Biot-Probiotic (FUSION PLUS) CAPS Take 1 tablet by mouth daily. 30 capsule 11  . lamoTRIgine (LAMICTAL) 100 MG tablet Take 1 tablet (100 mg total) by mouth daily. Titrate gradually to 200 mg as we discussed 180 tablet 0  . Levonorgestrel (SKYLA) 13.5 MG IUD by Intrauterine route.    . linaclotide (LINZESS) 290 MCG CAPS capsule Take 1 capsule (290 mcg total) by mouth daily. 30 capsule 3  . PREVIDENT 5000 BOOSTER PLUS 1.1 % PSTE   12  . rizatriptan (MAXALT-MLT) 5 MG disintegrating tablet Take 1 tablet (5 mg total) by mouth as needed for migraine. May repeat in 2 hours if needed 10 tablet 3  . tacrolimus (PROTOPIC) 0.1 % ointment Apply topically 2 (two) times daily. Only as needed to eczema spots. 100 g 0  . traZODone (DESYREL) 50 MG tablet Take 0.5-1 tablets (25-50 mg total) by mouth at bedtime as needed for sleep. 30 tablet 6  . valACYclovir (VALTREX) 1000 MG tablet TAKE 2 TABLETS TWICE A DAY  FOR ONE DAY FOR OUTBREAK. 8 tablet 0   No current facility-administered medications for this visit.      Musculoskeletal: Strength & Muscle Tone: within normal limits Gait & Station: normal Patient leans: N/A  Psychiatric Specialty Exam: Review of Systems  Constitutional: Negative.   HENT: Negative.   Respiratory: Negative.   Cardiovascular: Negative.   Gastrointestinal: Negative.   Musculoskeletal: Negative.   Neurological: Negative.   Psychiatric/Behavioral: Positive for depression. The patient is nervous/anxious and has insomnia.     Blood pressure 118/68, pulse 83, height  (1.499 m), weight 116 lb 12.8 oz (53 kg).Body mass index  is 23.59 kg/m.  General Appearance: Casual and Fairly Groomed  Eye Contact:  Fair  Speech:  Clear and Coherent  Volume:  Normal  Mood:  Anxious and Depressed  Affect:  Appropriate and Congruent  Thought Process:  Goal Directed  Orientation:  Full (Time, Place, and Person)  Thought Content: Logical   Suicidal Thoughts:  Yes.  without intent/plan  Homicidal Thoughts:  No  Memory:  Immediate;   Fair  Judgement:  Fair  Insight:  Fair  Psychomotor Activity:  Restlessness  Concentration:  Concentration: Fair  Recall:  [employer] of Knowledge: Fair  Language: Fair  Akathisia:  Negative  Handed:  Right  AIMS (if indicated): not done  Assets:  Communication Skills Desire for Improvement Financial Resources/Insurance Housing Physical Health Transportation Vocational/Educational  ADL's:  Intact  Cognition: WNL  Sleep:  Poor   Screenings: GAD-7     Counselor from 07/08/2016 in [facility]AL [facility] PARTIAL [facility]OSPITALIZATION PROGRAM Office Visit from 06/17/2016 in [place] [facility] [place]  Total GAD-7 Score  6  18    PHQ2-9     Office Visit from 07/14/2017 in [place] [facility] [place] Counselor from 07/08/2016 in BEHAVIORAL HEALTH PARTIAL HOSPITALIZATION PROGRAM Office Visit from 06/17/2016 in [facility]  [facility] [place] Office Visit from 09/07/2014 in [facility]  PHQ-2 Total Score  PHQ-9 Total Score  Assessment and Plan:  Victoria Barnes is a 31 year old female with bipolar spectrum disorder, mild, who presents today with mixed affect symptoms. She also struggles with comorbid ADHD with a fairly reliable history since childhood, which complicates her presentation.  She has been intermittently adherent to Abilify, secondary to side effects, but does tend to have less affectively lability when she takes abilify. She presents today with a worsening of her anxiety and depressive symptoms, racing thoughts and reduced need for sleep.  She has also been increasingly sexual and cheating on her partner.  She is not engaging in any substance abuse.  She does not have acute suicidality, but chronically struggles with nihilistic thoughts, wondering if there is any purpose to life altogether.    I don't believe that she needs to be psychiatrically hospitalized at this time, as she is able to display insight into her need for medication management, she has no active intention or plan to harm herself or others. She understands emergency resources if necessary.  Some of her maladaptive behaviors can also be attributed to a long-standing history of interpersonal and relationship difficulties, and ADHD. Her current presentation appears to be somewhat of a mix of hypomania and depressive symptoms.  She was recently started on Lamictal by primary care provider, and I agree with continued titration of this. I think she would also benefit from low-dose Rexulti in the short-term until she is at a therapeutic maintenance dose of Lamictal.  1. Bipolar 1 disorder (HCC)   2. Mixed bipolar affective disorder, moderate (HCC)    Increase Lamictal to 100 mg 1 week, then 150 mg 1 week, then 200 mg thereafter Initiate Rexulti 0.5 mg 1  week, then increase to 1 mg thereafter - patient has failed treatment with Abilify 5 mg secondary to intolerance (sedation, appetite increase, internal anxiety - she was previously tried on abilify for 8 weeks) Continue trazodone 50 mg nightly Okay to use clonazepam 0.5  mg daily for severe anxiety or panic, but I'd like to avoid this given some of her sexual disinhibition in the context of alcohol use in the past Hold Adderall; patient has not been taking this for at least 3 weeks Follow-up with this writer in 4 weeks as scheduled Follow-up on Monday with individual therapist  I spent 20 minutes with the patient in direct care and counseling. We discussed medication management interventions, and emergency resources if she requires psychiatric hospitalization.      Burnard Leigh, MD 08/07/2017, 11:54 AM

## 2017-08-07 NOTE — Patient Instructions (Signed)
Continue lamictal 100 mg nightly for 3-5 more days, then increase to 150 mg nightly for 1 week, then 200 mg after that  Start Rexulti 0.5 mg at night

## 2017-08-10 ENCOUNTER — Ambulatory Visit (INDEPENDENT_AMBULATORY_CARE_PROVIDER_SITE_OTHER): Payer: 59 | Admitting: Licensed Clinical Social Worker

## 2017-08-10 DIAGNOSIS — F319 Bipolar disorder, unspecified: Secondary | ICD-10-CM | POA: Diagnosis not present

## 2017-08-11 ENCOUNTER — Encounter (HOSPITAL_COMMUNITY): Payer: Self-pay | Admitting: Licensed Clinical Social Worker

## 2017-08-11 NOTE — Progress Notes (Signed)
Victoria Barnes is a black, 31 y.o. female patient who presents today to establish care w/ new therapist in this office. She reports she is transitioning care because she was having trouble "being honest" w/ previous therapist. She presents as active and engaged in session. She made strong eye contact w/ open body posture throughout. She reports she is enjoying her care w/ Dr. Daron Offer but has not started the [place] at this time since she "needed energy this past weekend and was not sure how the medication change would effect her". She insists she will begin the Rexulti asap. She is dressed casually and well groomed. 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.  Counselor asks pt to discuss her reasoning for seeking individual tx. She reports that she "loved PHP last year" and learned extensively about helping her mood and core beliefs. She regrets that she "has let things get to her again" and has become increasingly down, depressed, and pessimistic about her outlook on reality. She denies SI. She states she "often feels that white people hate her for the color of her skin" even though she knows that this is a generalization. She states that since the Trump presidency she is "seeing a lot of open racism in her life and in the news and it is causing her stress and to become physically ill to the point of throwing up and nauseated". Pt reports that around 1 yr ago she became serverly depressed, could not get out of bed, and stopped eating, and calling in sick. She then states that a few weeks later she was "chaotically energized" and bought a house, met a new man and entered a relationship and had other erratic spending she regrets.  Pt reports that her past tx work has been most effective when she discussed her emotions and became more "emotionally aware". She admits she "did not know she could be sad" before PHP taught her about the 5 basic  emotions. 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 When asked whether she has healthy black, female mentors speaking into her life, she replies "no".   She is living w/ her mother and white boyfriend and has a son who is 42 yrs old from a different man than her boyfriend. She does not reports extensively on her family dynamics and chooses instead to discuss her hx of cheating on her boyfriend w/ another man who is "very good at sex but not a good person". She admits she has been seeing this other man for years and feels upset that she "is willing to ruin a healthy, positive relationship w/ her current boyfriend who is very supportive of her and social justice causes". Pt states she "feels guilty but also would not stop seeing the other man sexually bc 'the sex is too good'".   When asked generally what "healthy would look like", pt responds that she wants to decrease her resentment to white people and the world in general, stop focusing on fixing other people's issues, and seeing more positivity in her own life. She states that her primary goal is to view things from a balanced perspective.   Pt was encouraged to seek out online communities such as "[employer] [place]" that are having open-minded, socially just, conversations on race, identity, and politics to increase positive feelings towards humanity.  Pt reports her previous therapist instructed her to "stop watching politics if it upsets her, which pt took as 'burying my head in the sand' which I can not do". This caused her to end the therapeutic relationship w/ this woman last year.   This writer was open and frank about being a cis-gendered, white, heterosexual female that does not know her experience but believes that his desire to understand and help pt supersedes their differing identities. Pt agreed and stated she is feeling optimistic  about this therapeutic relationship. Counselor described his therapeutic style as Mindfulness-based CBT w/ social awareness and here-and-now experiencing. Pt verbalized understanding. Pt wants to meet weekly to begin.        Archie Balboa, LCAS-A

## 2017-08-18 ENCOUNTER — Telehealth (HOSPITAL_COMMUNITY): Payer: Self-pay

## 2017-08-18 NOTE — Telephone Encounter (Signed)
Excellent, thank you!

## 2017-08-18 NOTE — Telephone Encounter (Signed)
Medication management - Prior authorization completed online with [employer] and faxed by them to [employer] for review.  Determination should be within 5 days for Rexulti.

## 2017-08-20 ENCOUNTER — Other Ambulatory Visit: Payer: Self-pay | Admitting: *Deleted

## 2017-08-20 DIAGNOSIS — G43909 Migraine, unspecified, not intractable, without status migrainosus: Secondary | ICD-10-CM

## 2017-08-20 MED ORDER — RIZATRIPTAN BENZOATE 5 MG PO TBDP: 5.0000 mg | ORAL_TABLET | ORAL | 3 refills | Status: DC | PRN

## 2017-08-21 ENCOUNTER — Telehealth (HOSPITAL_COMMUNITY): Payer: Self-pay

## 2017-08-21 NOTE — Telephone Encounter (Signed)
Medication management - [facility] with Daphane[facility] to inform notice was received from [employer] that pt's Rexulti 1 mg was approved from 08/18/17-08/17/18.

## 2017-08-26 ENCOUNTER — Telehealth: Payer: Self-pay | Admitting: *Deleted

## 2017-08-26 DIAGNOSIS — R5383 Other fatigue: Secondary | ICD-10-CM

## 2017-08-26 DIAGNOSIS — E559 Vitamin D deficiency, unspecified: Secondary | ICD-10-CM

## 2017-08-26 DIAGNOSIS — Z13220 Encounter for screening for lipoid disorders: Secondary | ICD-10-CM

## 2017-08-26 DIAGNOSIS — Z131 Encounter for screening for diabetes mellitus: Secondary | ICD-10-CM

## 2017-08-26 NOTE — Telephone Encounter (Signed)
Labs ordered for CPE

## 2017-08-28 ENCOUNTER — Ambulatory Visit (HOSPITAL_COMMUNITY): Payer: Self-pay | Admitting: Licensed Clinical Social Worker

## 2017-08-31 ENCOUNTER — Encounter (HOSPITAL_COMMUNITY): Payer: Self-pay | Admitting: Licensed Clinical Social Worker

## 2017-08-31 ENCOUNTER — Ambulatory Visit (INDEPENDENT_AMBULATORY_CARE_PROVIDER_SITE_OTHER): Payer: 59 | Admitting: Licensed Clinical Social Worker

## 2017-08-31 DIAGNOSIS — F902 Attention-deficit hyperactivity disorder, combined type: Secondary | ICD-10-CM

## 2017-08-31 DIAGNOSIS — F319 Bipolar disorder, unspecified: Secondary | ICD-10-CM

## 2017-08-31 NOTE — Progress Notes (Signed)
   THERAPIST PROGRESS NOTE  Session Time: 10-11am  Participation Level: Active  Behavioral Response: Neat and Well GroomedAlertEuthymic  Type of Therapy: Individual Therapy  Treatment Goals addressed: Anxiety and Diagnosis: ADHD, BP1  Interventions: CBT, Strength-based, Supportive and Other: Medication acceptance  Summary: Victoria Barnes is a 31 y.o. female who presents with hx of ADHD, manic behavior, and anxiety. She states that her previous month has been "overall good" and she is worrying less. She reports her medications "appear to be helping but she wants to eventually get off them".  Counselor and pt discussed pros and cons of continuing her medications. Pt described having some minor heartburn, nerve pain in her hands, and sleep paralysis that she attributes to starting Lamictal.  Counselor spends time normalizing adjustment time to medications and ambivalence towards continuing medication. Pt reported on her inability to focus and how much it bothers her. She states that she recently did a 4 day juice cleanse and noticed that her attention was focused on food. This made her happy since she realized she could stay "on task/focused when the right motivation was in place".  Pt states she has been thinking more positively and "talking to herself positively, out loud" to encourage herself. Pt is replacing negative thoughts about work w/ positive thoughts of "loving her job". Pt states she has recently found a new job and will start in a few weeks.   Near end of session pt began discussing getting off medications again. Counselor asked if pt had a physical disease would she forgo tx. Pt replied, "if I had cancer I would let it take me and I would not do chemo bc it would be a sign I need to move on".   Suicidal/Homicidal: Nowithout intent/plan  Therapist Response: Counselor used open questions and accurate empathy to help continue to build rapport. Counselor used immediacy and broaching  to discuss differences in interpretation as a means of growing intimacy and modeling focus and listening skills.  Plan: Return again in 2 weeks.  Diagnosis:    ICD-10-CM   1. Bipolar 1 disorder (HCC) F31.9   2. Attention deficit hyperactivity disorder (ADHD), combined type F90.2       Margo CommonWesley E Swan, LCAS-A 08/31/2017

## 2017-09-07 ENCOUNTER — Ambulatory Visit (INDEPENDENT_AMBULATORY_CARE_PROVIDER_SITE_OTHER): Payer: 59 | Admitting: Licensed Clinical Social Worker

## 2017-09-07 DIAGNOSIS — F319 Bipolar disorder, unspecified: Secondary | ICD-10-CM | POA: Diagnosis not present

## 2017-09-07 DIAGNOSIS — Z13220 Encounter for screening for lipoid disorders: Secondary | ICD-10-CM | POA: Diagnosis not present

## 2017-09-07 DIAGNOSIS — F902 Attention-deficit hyperactivity disorder, combined type: Secondary | ICD-10-CM | POA: Diagnosis not present

## 2017-09-07 DIAGNOSIS — R5383 Other fatigue: Secondary | ICD-10-CM | POA: Diagnosis not present

## 2017-09-07 DIAGNOSIS — Z131 Encounter for screening for diabetes mellitus: Secondary | ICD-10-CM | POA: Diagnosis not present

## 2017-09-07 DIAGNOSIS — E559 Vitamin D deficiency, unspecified: Secondary | ICD-10-CM | POA: Diagnosis not present

## 2017-09-08 ENCOUNTER — Encounter: Payer: Self-pay | Admitting: Family Medicine

## 2017-09-08 ENCOUNTER — Encounter (HOSPITAL_COMMUNITY): Payer: Self-pay | Admitting: Licensed Clinical Social Worker

## 2017-09-08 ENCOUNTER — Ambulatory Visit (INDEPENDENT_AMBULATORY_CARE_PROVIDER_SITE_OTHER): Payer: 59 | Admitting: Family Medicine

## 2017-09-08 ENCOUNTER — Other Ambulatory Visit (HOSPITAL_COMMUNITY)
Admission: RE | Admit: 2017-09-08 | Discharge: 2017-09-08 | Disposition: A | Discharge status: Home / Self Care | Payer: 59 | Source: Ambulatory Visit | Attending: Family Medicine | Admitting: Family Medicine

## 2017-09-08 VITALS — BP 99/55 | HR 86 | Ht 59.0 in | Wt 117.0 lb

## 2017-09-08 DIAGNOSIS — Z124 Encounter for screening for malignant neoplasm of cervix: Secondary | ICD-10-CM | POA: Insufficient documentation

## 2017-09-08 DIAGNOSIS — Z0000 Encounter for general adult medical examination without abnormal findings: Secondary | ICD-10-CM

## 2017-09-08 LAB — COMPLETE METABOLIC PANEL WITH GFR
AG RATIO: 1.8 (calc) (ref 1.0–2.5)
ALBUMIN MSPROF: 4.6 g/dL (ref 3.6–5.1)
ALT: 12 U/L (ref 6–29)
AST: 14 U/L (ref 10–30)
Alkaline phosphatase (APISO): 53 U/L (ref 33–115)
BUN: 10 mg/dL (ref 7–25)
CALCIUM: 9.4 mg/dL (ref 8.6–10.2)
CO2: 21 mmol/L (ref 20–32)
Chloride: 106 mmol/L (ref 98–110)
Creat: 0.89 mg/dL (ref 0.50–1.10)
GFR, EST AFRICAN AMERICAN: 102 mL/min/{1.73_m2} (ref >=60)
GFR, EST NON AFRICAN AMERICAN: 88 mL/min/{1.73_m2} (ref >=60)
GLOBULIN: 2.6 g/dL (ref 1.9–3.7)
Glucose, Bld: 86 mg/dL (ref 65–99)
POTASSIUM: 3.8 mmol/L (ref 3.5–5.3)
Sodium: 138 mmol/L (ref 135–146)
TOTAL PROTEIN: 7.2 g/dL (ref 6.1–8.1)
Total Bilirubin: 0.6 mg/dL (ref 0.2–1.2)

## 2017-09-08 LAB — CBC WITH DIFFERENTIAL/PLATELET
BASOS PCT: 0.4 %
Basophils Absolute: 33 cells/uL (ref 0–200)
EOS ABS: 365 {cells}/uL (ref 15–500)
EOS PCT: 4.4 %
HEMATOCRIT: 38.9 % (ref 35.0–45.0)
HEMOGLOBIN: 12.9 g/dL (ref 11.7–15.5)
LYMPHS ABS: 2058 {cells}/uL (ref 850–3900)
MCH: 29.4 pg (ref 27.0–33.0)
MCHC: 33.2 g/dL (ref 32.0–36.0)
MCV: 88.6 fL (ref 80.0–100.0)
MONOS PCT: 5.9 %
MPV: 11.4 fL (ref 7.5–12.5)
NEUTROS ABS: 5354 {cells}/uL (ref 1500–7800)
Neutrophils Relative %: 64.5 %
Platelets: 205 10*3/uL (ref 140–400)
RBC: 4.39 10*6/uL (ref 3.80–5.10)
RDW: 11.7 % (ref 11.0–15.0)
Total Lymphocyte: 24.8 %
WBC mixed population: 490 cells/uL (ref 200–950)
WBC: 8.3 10*3/uL (ref 3.8–10.8)

## 2017-09-08 LAB — LIPID PANEL W/REFLEX DIRECT LDL
Cholesterol: 177 mg/dL (ref <200)
HDL: 71 mg/dL (ref >50)
LDL CHOLESTEROL (CALC): 95 mg/dL
NON-HDL CHOLESTEROL (CALC): 106 mg/dL (ref <130)
TRIGLYCERIDES: 38 mg/dL (ref <150)
Total CHOL/HDL Ratio: 2.5 (calc) (ref <5.0)

## 2017-09-08 LAB — FERRITIN: Ferritin: 51 ng/mL (ref 10–154)

## 2017-09-08 LAB — TSH: TSH: 1.62 mIU/L

## 2017-09-08 LAB — VITAMIN D 25 HYDROXY (VIT D DEFICIENCY, FRACTURES): Vit D, 25-Hydroxy: 34 ng/mL (ref 30–100)

## 2017-09-08 LAB — VITAMIN B12: VITAMIN B 12: 652 pg/mL (ref 200–1100)
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
## 2017-09-08 NOTE — Patient Instructions (Addendum)
Try [address] for sleep on Youtube. Can try watching nightly for a couple of weeks and see ifyou feel it is helpful  \  Health Maintenance, Female Adopting a healthy lifestyle and getting preventive care can go a long way to promote health and wellness. Talk with your health care provider about what schedule of regular examinations is right for you. This is a good chance for you to check in with your provider about disease prevention and staying healthy. In between checkups, there are plenty of things you can do on your own. Experts have done a lot of research about which lifestyle changes and preventive measures are most likely to keep you healthy. Ask your health care provider for more information. Weight and diet Eat a healthy diet  Be sure to include plenty of vegetables, fruits, low-fat dairy products, and lean protein.  Do not eat a lot of foods high in solid fats, added sugars, or salt.  Get regular exercise. This is one of the most important things you can do for your health. ? Most adults should exercise for at least 150 minutes each week. The exercise should increase your heart rate and make you sweat (moderate-intensity exercise). ? Most adults should also do strengthening exercises at least twice a week. This is in addition to the moderate-intensity exercise.  Maintain a healthy weight  Body mass index (BMI) is a measurement that can be used to identify possible weight problems. It estimates body fat based on height and weight. Your health care provider can help determine your BMI and help you achieve or maintain a healthy weight.  For females 65 years of age and older: ? A BMI below 18.5 is considered underweight. ? A BMI of 18.5 to 24.9 is normal. ? A BMI of 25 to 29.9 is considered overweight. ? A BMI of 30 and above is considered obese.  Watch levels of cholesterol and blood lipids  You should start having your blood tested for lipids and cholesterol at 29 years of  age, then have this test every 5 years.  You may need to have your cholesterol levels checked more often if: ? Your lipid or cholesterol levels are high. ? You are older than 29 years of age. ? You are at high risk for heart disease.  Cancer screening Lung Cancer  Lung cancer screening is recommended for adults 52-17 years old who are at high risk for lung cancer because of a history of smoking.  A yearly low-dose CT scan of the lungs is recommended for people who: ? Currently smoke. ? Have quit within the past 15 years. ? Have at least a 30-pack-year history of smoking. A pack year is smoking an average of one pack of cigarettes a day for 1 year.  Yearly screening should continue until it has been 15 years since you quit.  Yearly screening should stop if you develop a health problem that would prevent you from having lung cancer treatment.  Breast Cancer  Practice breast self-awareness. This means understanding how your breasts normally appear and feel.  It also means doing regular breast self-exams. Let your health care provider know about any changes, no matter how small.  If you are in your 20s or 30s, you should have a clinical breast exam (CBE) by a health care provider every 1-3 years as part of a regular health exam.  If you are 48 or older, have a CBE every year. Also consider having a breast X-ray (mammogram) every year.  If you have a family history of breast cancer, talk to your health care provider about genetic screening.  If you are at high risk for breast cancer, talk to your health care provider about having an MRI and a mammogram every year.  Breast cancer gene (BRCA) assessment is recommended for women who have family members with BRCA-related cancers. BRCA-related cancers include: ? Breast. ? Ovarian. ? Tubal. ? Peritoneal cancers.  Results of the assessment will determine the need for genetic counseling and BRCA1 and BRCA2 testing.  Cervical  Cancer Your health care provider may recommend that you be screened regularly for cancer of the pelvic organs (ovaries, uterus, and vagina). This screening involves a pelvic examination, including checking for microscopic changes to the surface of your cervix (Pap test). You may be encouraged to have this screening done every 3 years, beginning at age 28.  For women ages 42-65, health care providers may recommend pelvic exams and Pap testing every 3 years, or they may recommend the Pap and pelvic exam, combined with testing for human papilloma virus (HPV), every 5 years. Some types of HPV increase your risk of cervical cancer. Testing for HPV may also be done on women of any age with unclear Pap test results.  Other health care providers may not recommend any screening for nonpregnant women who are considered low risk for pelvic cancer and who do not have symptoms. Ask your health care provider if a screening pelvic exam is right for you.  If you have had past treatment for cervical cancer or a condition that could lead to cancer, you need Pap tests and screening for cancer for at least 20 years after your treatment. If Pap tests have been discontinued, your risk factors (such as having a new sexual partner) need to be reassessed to determine if screening should resume. Some women have medical problems that increase the chance of getting cervical cancer. In these cases, your health care provider may recommend more frequent screening and Pap tests.  Colorectal Cancer  This type of cancer can be detected and often prevented.  Routine colorectal cancer screening usually begins at 29 years of age and continues through 29 years of age.  Your health care provider may recommend screening at an earlier age if you have risk factors for colon cancer.  Your health care provider may also recommend using home test kits to check for hidden blood in the stool.  A small camera at the end of a tube can be used to  examine your colon directly (sigmoidoscopy or colonoscopy). This is done to check for the earliest forms of colorectal cancer.  Routine screening usually begins at age 21.  Direct examination of the colon should be repeated every 5-10 years through 29 years of age. However, you may need to be screened more often if early forms of precancerous polyps or small growths are found.  Skin Cancer  Check your skin from head to toe regularly.  Tell your health care provider about any new moles or changes in moles, especially if there is a change in a mole's shape or color.  Also tell your health care provider if you have a mole that is larger than the size of a pencil eraser.  Always use sunscreen. Apply sunscreen liberally and repeatedly throughout the day.  Protect yourself by wearing long sleeves, pants, a wide-brimmed hat, and sunglasses whenever you are outside.  Heart disease, diabetes, and high blood pressure  High blood pressure causes heart disease and  increases the risk of stroke. High blood pressure is more likely to develop in: ? People who have blood pressure in the high end of the normal range (130-139/85-89 mm Hg). ? People who are overweight or obese. ? People who are African American.  If you are 73-54 years of age, have your blood pressure checked every 3-5 years. If you are 23 years of age or older, have your blood pressure checked every year. You should have your blood pressure measured twice-once when you are at a hospital or clinic, and once when you are not at a hospital or clinic. Record the average of the two measurements. To check your blood pressure when you are not at a hospital or clinic, you can use: ? An automated blood pressure machine at a pharmacy. ? A home blood pressure monitor.  If you are between 67 years and 20 years old, ask your health care provider if you should take aspirin to prevent strokes.  Have regular diabetes screenings. This involves taking a  blood sample to check your fasting blood sugar level. ? If you are at a normal weight and have a low risk for diabetes, have this test once every three years after 29 years of age. ? If you are overweight and have a high risk for diabetes, consider being tested at a younger age or more often. Preventing infection Hepatitis B  If you have a higher risk for hepatitis B, you should be screened for this virus. You are considered at high risk for hepatitis B if: ? You were born in a country where hepatitis B is common. Ask your health care provider which countries are considered high risk. ? Your parents were born in a high-risk country, and you have not been immunized against hepatitis B (hepatitis B vaccine). ? You have HIV or AIDS. ? You use needles to inject street drugs. ? You live with someone who has hepatitis B. ? You have had sex with someone who has hepatitis B. ? You get hemodialysis treatment. ? You take certain medicines for conditions, including cancer, organ transplantation, and autoimmune conditions.  Hepatitis C  Blood testing is recommended for: ? Everyone born from [birth year] through 1963. ? Anyone with known risk factors for hepatitis C.  Sexually transmitted infections (STIs)  You should be screened for sexually transmitted infections (STIs) including gonorrhea and chlamydia if: ? You are sexually active and are younger than 29 years of age. ? You are older than 29 years of age and your health care provider tells you that you are at risk for this type of infection. ? Your sexual activity has changed since you were last screened and you are at an increased risk for chlamydia or gonorrhea. Ask your health care provider if you are at risk.  If you do not have HIV, but are at risk, it may be recommended that you take a prescription medicine daily to prevent HIV infection. This is called pre-exposure prophylaxis (PrEP). You are considered at risk if: ? You are sexually active and  do not regularly use condoms or know the HIV status of your partner(s). ? You take drugs by injection. ? You are sexually active with a partner who has HIV.  Talk with your health care provider about whether you are at high risk of being infected with HIV. If you choose to begin PrEP, you should first be tested for HIV. You should then be tested every 3 months for as long as you are taking  PrEP. Pregnancy  If you are premenopausal and you may become pregnant, ask your health care provider about preconception counseling.  If you may become pregnant, take 400 to 800 micrograms (mcg) of folic acid every day.  If you want to prevent pregnancy, talk to your health care provider about birth control (contraception). Osteoporosis and menopause  Osteoporosis is a disease in which the bones lose minerals and strength with aging. This can result in serious bone fractures. Your risk for osteoporosis can be identified using a bone density scan.  If you are 45 years of age or older, or if you are at risk for osteoporosis and fractures, ask your health care provider if you should be screened.  Ask your health care provider whether you should take a calcium or vitamin D supplement to lower your risk for osteoporosis.  Menopause may have certain physical symptoms and risks.  Hormone replacement therapy may reduce some of these symptoms and risks. Talk to your health care provider about whether hormone replacement therapy is right for you. Follow these instructions at home:  Schedule regular health, dental, and eye exams.  Stay current with your immunizations.  Do not use any tobacco products including cigarettes, chewing tobacco, or electronic cigarettes.  If you are pregnant, do not drink alcohol.  If you are breastfeeding, limit how much and how often you drink alcohol.  Limit alcohol intake to no more than 1 drink per day for nonpregnant women. One drink equals 12 ounces of beer, 5 ounces of  wine, or 1 ounces of hard liquor.  Do not use street drugs.  Do not share needles.  Ask your health care provider for help if you need support or information about quitting drugs.  Tell your health care provider if you often feel depressed.  Tell your health care provider if you have ever been abused or do not feel safe at home. This information is not intended to replace advice given to you by your health care provider. Make sure you discuss any questions you have with your health care provider. Document Released: 05/05/2011 Document Revised: 03/27/2016 Document Reviewed: 07/24/2015 Elsevier Interactive Patient Education  [employer].

## 2017-09-09 NOTE — Progress Notes (Signed)
Subjective:     Victoria Barnes is a 31 y.o. female and is here for a comprehensive physical exam. The patient reports no problems.  She is not currently exercising but has in the past.  She is starting a new job in the next couple of weeks.  She did request to have a wet prep and GC chlamydia testing performed today.  She is not having any current vaginal symptoms.  Occasionally she will notice a slight change in discharge but it is intermittent.  Social History   Socioeconomic History  . Marital status: Single    Spouse name: Not on file  . Number of children: Not on file  . Years of education: Not on file  . Highest education level: Not on file  Social Needs  . Financial resource strain: Not on file  . Food insecurity - worry: Not on file  . Food insecurity - inability: Not on file  . Transportation needs - medical: Not on file  . Transportation needs - non-medical: Not on file  Occupational History  . Occupation: CMA  Tobacco Use  . Smoking status: Former Smoker    Types: Cigarettes    Last attempt to quit: 11/2014    Years since quitting: 2.8  . Smokeless tobacco: Never Used  . Tobacco comment: quit 2 years  Substance and Sexual Activity  . Alcohol use: Yes    Comment: once or twice a month - socially  . Drug use: No  . Sexual activity: Yes    Partners: Male    Birth control/protection: IUD    Comment: condoms  Other Topics Concern  . Not on file  Social History Narrative  . Not on file   Health Maintenance  Topic Date Due  . PAP SMEAR  09/07/2017  . TETANUS/TDAP  09/03/2024  . INFLUENZA VACCINE  Completed  . HIV Screening  Completed    The following portions of the patient's history were reviewed and updated as appropriate: allergies, current medications, past family history, past medical history, past social history, past surgical history and problem list.  Review of Systems A comprehensive review of systems was negative.   Objective:    BP (!) 99/55    Pulse 86   Ht 4\' 11"  (1.499 m)   Wt 117 lb (53.1 kg)   LMP 08/22/2017   SpO2 100%   BMI 23.63 kg/m  General appearance: alert, cooperative and appears stated age Head: Normocephalic, without obvious abnormality, atraumatic Eyes: conj clear, EOMI, PEERLA Ears: normal TM's and external ear canals both ears Nose: Nares normal. Septum midline. Mucosa normal. No drainage or sinus tenderness. Throat: lips, mucosa, and tongue normal; teeth and gums normal Neck: no adenopathy, no carotid bruit, no JVD, supple, symmetrical, trachea midline and thyroid not enlarged, symmetric, no tenderness/mass/nodules Back: symmetric, no curvature. ROM normal. No CVA tenderness. Lungs: clear to auscultation bilaterally Breasts: normal appearance, no masses or tenderness, bilat implants Heart: regular rate and rhythm, S1, S2 normal, no murmur, click, rub or gallop Abdomen: soft, non-tender; bowel sounds normal; no masses,  no organomegaly Pelvic: cervix normal in appearance, external genitalia normal, no adnexal masses or tenderness, no cervical motion tenderness, rectovaginal septum normal, uterus normal size, shape, and consistency and vagina normal without discharge Extremities: extremities normal, atraumatic, no cyanosis or edema Pulses: 2+ and symmetric Skin: Skin color, texture, turgor normal. No rashes or lesions Lymph nodes: Cervical, supraclavicular, and axillary nodes normal. Neurologic: Alert and oriented X 3, normal strength and tone. Normal  symmetric reflexes. Normal coordination and gait    Assessment:    Healthy female exam.      Plan:     See After Visit Summary for Counseling Recommendations   Keep up a regular exercise program and make sure you are eating a healthy diet Try to eat 4 servings of dairy a day, or if you are lactose intolerant take a calcium with vitamin D daily.  Your vaccines are up to date.   Insomnia - she is wokring on sleep hygiene.  Recommend trying the [employer]  videos on youtube as well.

## 2017-09-09 NOTE — Telephone Encounter (Signed)
Vitamin D normal range but on low side.  B12 great.  Normal thyroid.   Labs actually looks really good.

## 2017-09-10 ENCOUNTER — Encounter (HOSPITAL_COMMUNITY): Payer: Self-pay | Admitting: Psychiatry

## 2017-09-10 ENCOUNTER — Other Ambulatory Visit: Payer: Self-pay | Admitting: Family Medicine

## 2017-09-10 ENCOUNTER — Ambulatory Visit (INDEPENDENT_AMBULATORY_CARE_PROVIDER_SITE_OTHER): Payer: 59 | Admitting: Psychiatry

## 2017-09-10 VITALS — BP 110/68 | HR 104 | Ht 59.0 in | Wt 115.0 lb

## 2017-09-10 DIAGNOSIS — Z87891 Personal history of nicotine dependence: Secondary | ICD-10-CM | POA: Diagnosis not present

## 2017-09-10 DIAGNOSIS — F319 Bipolar disorder, unspecified: Secondary | ICD-10-CM

## 2017-09-10 DIAGNOSIS — F902 Attention-deficit hyperactivity disorder, combined type: Secondary | ICD-10-CM

## 2017-09-10 DIAGNOSIS — R451 Restlessness and agitation: Secondary | ICD-10-CM | POA: Diagnosis not present

## 2017-09-10 DIAGNOSIS — R4587 Impulsiveness: Secondary | ICD-10-CM | POA: Diagnosis not present

## 2017-09-10 DIAGNOSIS — Z818 Family history of other mental and behavioral disorders: Secondary | ICD-10-CM | POA: Diagnosis not present

## 2017-09-10 DIAGNOSIS — Z975 Presence of (intrauterine) contraceptive device: Secondary | ICD-10-CM | POA: Diagnosis not present

## 2017-09-10 DIAGNOSIS — R4586 Emotional lability: Secondary | ICD-10-CM

## 2017-09-10 LAB — GC/CHLAMYDIA PROBE, AMP (THROAT)

## 2017-09-10 LAB — WET PREP FOR TRICH, YEAST, CLUE
MICRO NUMBER: 81253018
Specimen Quality: ADEQUATE

## 2017-09-10 LAB — C. TRACHOMATIS/N. GONORRHOEAE RNA
C. trachomatis RNA, TMA: NOT DETECTED
N. gonorrhoeae RNA, TMA: NOT DETECTED

## 2017-09-10 MED ORDER — LAMOTRIGINE 100 MG PO TABS: 250.0000 mg | ORAL_TABLET | Freq: Every day | ORAL | 1 refills | Status: DC

## 2017-09-10 MED ORDER — TRAZODONE HCL 100 MG PO TABS: 100.0000 mg | ORAL_TABLET | Freq: Every evening | ORAL | 1 refills | Status: DC | PRN

## 2017-09-10 MED ORDER — AMPHETAMINE-DEXTROAMPHETAMINE 30 MG PO TABS: 30.0000 mg | ORAL_TABLET | Freq: Two times a day (BID) | ORAL | 0 refills | Status: DC

## 2017-09-10 MED ORDER — FLUCONAZOLE 150 MG PO TABS: 150.0000 mg | ORAL_TABLET | Freq: Every day | ORAL | 1 refills | Status: DC

## 2017-09-10 MED FILL — RIZATRIPTAN 5 MG ODT: 5 | 30 days supply | Qty: 10 | Fill #0

## 2017-09-10 MED FILL — traZODone HCL 50 MG TABS: 50 | 30 days supply | Qty: 30 | Fill #1

## 2017-09-10 MED FILL — FUSION PLUS CAPSULE: 30 days supply | Qty: 30 | Fill #2

## 2017-09-10 MED FILL — FLUCONAZOLE 150 MG TABLET: 150 | 1 days supply | Qty: 1 | Fill #0

## 2017-09-10 MED FILL — metroNIDAZOLE 0.75 % GEL: 0.75 | 5 days supply | Qty: 70 | Fill #1

## 2017-09-10 NOTE — Progress Notes (Signed)
dfilucan sent for yeast infection.

## 2017-09-10 NOTE — Progress Notes (Signed)
BH MD/PA/NP OP Progress Note  09/10/2017 3:34 PM Victoria Barnes  MRN:  [id number]  Chief Complaint:  Chief Complaint    Follow-up     HPI: Patient has significant improvement in her mood lability, impulsivity, rapid racing thoughts, and agitation.  She reports that her sleep is improved, and she tends to use trazodone at night to help support her sleep.  She is taking Lamictal 250 mg nightly.  She has no skin rash and has not had any intolerance to Lamictal.  We agreed to continue the current dose of 250 mg.  She is not taking any other medications, and is not taking Prozac which was erroneously entered on her medication list from past prescription.  She reports that she has taken Adderall about 2-3 times over the past month, and noticed that the days she took it, she was able to focus and get things done during the day, and her mood remained very stable.  She wonders about restarting Adderall 30 mg twice daily which she had previously taken.  I agreed to proceed with restarting this, and she will remain cautious and vigilant of any mood changes.  I educated her that I suspect she will need to remain on Lamictal for life, or some other mood stabilizer for life, given that when she has relapses and her mood and lability, she tends to engage in destructive behaviors.  She agreed to this, and agrees that she has developed insight into bipolar disorder and she has worked on accepting her illness.  She continues to work in individual therapy with [facility], and applauded her commitment to improving her mental health.   I spent time with her processing some of her experience with regard to crying and her sense of embarrassment.    I had a discussion with her about crying and how this can represent a deep emotional connection and sense of presence, much like activities related to intimacy might induce climax.  The patient was able to follow this thought process to understand how crying can serve a purpose to  relieve tension and improve a sense of presents and connectedness with the thoughts and emotions at hand.  She shares that she has a new job that she is starting, and this is a positive change.  She is not engaging in any promiscuous activities or excessive spending or unsafe behaviors.  She has noticed that she is less melancholic and pessimistic about the world.  She denies any acute safety issues and agrees to follow-up in 3 months.  Visit Diagnosis:    ICD-10-CM   1. Bipolar 1 disorder (HCC) F31.9 lamoTRIgine (LAMICTAL) 100 MG tablet    traZODone (DESYREL) 100 MG tablet  2. Attention deficit hyperactivity disorder (ADHD), combined type F90.2 amphetamine-dextroamphetamine (ADDERALL) 30 MG tablet    Past Psychiatric History: See intake H&P for full details. Reviewed, with no updates at this time.   Past Medical History:  Past Medical History:  Diagnosis Date  . ADHD   . Bipolar I disorder (HCC)   . Depression   . Eczema 2012  . Migraine without status migrainosus, not intractable 02/26/2016    Past Surgical History:  Procedure Laterality Date  . BREAST ENHANCEMENT SURGERY Bilateral June 2015  . WISDOM TOOTH EXTRACTION      Family Psychiatric History: See intake H&P for full details. Reviewed, with no updates at this time.   Family History:  Family History  Problem Relation Age of Onset  . Hyperlipidemia Mother   .  Personality disorder Maternal Grandmother   . Depression Maternal Grandmother   . Mood Disorder Maternal Grandmother     Social History:  Social History   Socioeconomic History  . Marital status: Single    Spouse name: None  . Number of children: None  . Years of education: None  . Highest education level: None  Social Needs  . Financial resource strain: Not hard at all  . Food insecurity - worry: Never true  . Food insecurity - inability: Never true  . Transportation needs - medical: No  . Transportation needs - non-medical: No  Occupational History   . Occupation: CMA  Tobacco Use  . Smoking status: Former Smoker    Types: Cigarettes    Last attempt to quit: 11/2014    Years since quitting: 2.8  . Smokeless tobacco: Never Used  . Tobacco comment: quit 2 years  Substance and Sexual Activity  . Alcohol use: Yes    Comment: once or twice a month - socially  . Drug use: No  . Sexual activity: Yes    Partners: Male    Birth control/protection: IUD    Comment: condoms  Other Topics Concern  . None  Social History Narrative  . None    Allergies:  Allergies  Allergen Reactions  . Antacid [Alum & Mag Hydroxide-Simeth] Nausea And Vomiting  . Keflex [Cephalexin] Nausea And Vomiting  . Penicillins Nausea And Vomiting  . Trintellix [Vortioxetine]     Swelling/numbness and tingling of extremities.   Elliot Cousin. Viibryd [Vilazodone Hcl]     Abnormal Frightening Dreams    Metabolic Disorder Labs: No results found for: HGBA1C, MPG No results found for: PROLACTIN Lab Results  Component Value Date   CHOL 177 09/07/2017   TRIG 38 09/07/2017   HDL 71 09/07/2017   CHOLHDL 2.5 09/07/2017   VLDL 6 09/08/2016   LDLCALC 111 (H) 09/08/2016   LDLCALC 89 06/18/2015   Lab Results  Component Value Date   TSH 1.62 09/07/2017   TSH 2.06 02/23/2017    Therapeutic Level Labs: No results found for: LITHIUM No results found for: VALPROATE No components found for:  CBMZ  Current Medications: Current Outpatient Medications  Medication Sig Dispense Refill  . Clobetasol Prop Emollient Base (CLOBETASOL PROPIONATE E) 0.05 % emollient cream Use twice a day only as needed for eczema. 30 g 6  . fexofenadine (ALLEGRA ALLERGY) 180 MG tablet Take 1 tablet (180 mg total) by mouth daily. 90 tablet 3  . fluconazole (DIFLUCAN) 150 MG tablet Take 1 tablet (150 mg total) daily by mouth. 1 tablet 1  . fluticasone (FLONASE) 50 MCG/ACT nasal spray Place 1 spray into both nostrils daily. 15.8 g 2  . Ginkgo Biloba 40 MG TABS Take by mouth.    . Iron-FA-B  Cmp-C-Biot-Probiotic (FUSION PLUS) CAPS Take 1 tablet by mouth daily. 30 capsule 11  . Levonorgestrel (SKYLA) 13.5 MG IUD by Intrauterine route.    . linaclotide (LINZESS) 290 MCG CAPS capsule Take 1 capsule (290 mcg total) by mouth daily. 30 capsule 3  . PREVIDENT 5000 BOOSTER PLUS 1.1 % PSTE   12  . rizatriptan (MAXALT-MLT) 5 MG disintegrating tablet Take 1 tablet (5 mg total) by mouth as needed for migraine. May repeat in 2 hours if needed 10 tablet 3  . tacrolimus (PROTOPIC) 0.1 % ointment Apply topically 2 (two) times daily. Only as needed to eczema spots. 100 g 0  . traZODone (DESYREL) 100 MG tablet Take 1 tablet (100 mg total)  at bedtime as needed by mouth for sleep. 90 tablet 1  . valACYclovir (VALTREX) 1000 MG tablet TAKE 2 TABLETS TWICE A DAY FOR ONE DAY FOR OUTBREAK. 8 tablet 0  . amphetamine-dextroamphetamine (ADDERALL) 30 MG tablet Take 1 tablet 2 (two) times daily by mouth. 60 tablet 0  . amphetamine-dextroamphetamine (ADDERALL) 30 MG tablet Take 1 tablet 2 (two) times daily by mouth. 60 tablet 0  . amphetamine-dextroamphetamine (ADDERALL) 30 MG tablet Take 1 tablet 2 (two) times daily by mouth. 60 tablet 0  . lamoTRIgine (LAMICTAL) 100 MG tablet Take 2.5 tablets (250 mg total) at bedtime by mouth. 225 tablet 1   No current facility-administered medications for this visit.      Musculoskeletal: Strength & Muscle Tone: within normal limits Gait & Station: normal Patient leans: N/A  Psychiatric Specialty Exam: ROS  Blood pressure 110/68, pulse (!) 104, height 4\' 11"  (1.499 m), weight 115 lb (52.2 kg), last menstrual period 08/22/2017, SpO2 98 %.Body mass index is 23.23 kg/m.  General Appearance: Casual and Fairly Groomed  Eye Contact:  Good  Speech:  Clear and Coherent and Normal Rate  Volume:  Normal  Mood:  Euthymic  Affect:  Appropriate and Congruent  Thought Process:  Goal Directed and Descriptions of Associations: Intact  Orientation:  Full (Time, Place, and Person)   Thought Content: Logical   Suicidal Thoughts:  No  Homicidal Thoughts:  No  Memory:  Immediate;   Good  Judgement:  Good  Insight:  Fair  Psychomotor Activity:  Normal  Concentration:  Concentration: Good  Recall:  Good  Fund of Knowledge: Good  Language: Good  Akathisia:  Negative  Handed:  Right  AIMS (if indicated): not done  Assets:  Communication Skills Desire for Improvement Financial Resources/Insurance Housing Intimacy Physical Health Resilience Social Support Talents/Skills Transportation Vocational/Educational  ADL's:  Intact  Cognition: WNL  Sleep:  Good   Screenings: GAD-7     Counselor from 07/08/2016 in BEHAVIORAL HEALTH PARTIAL HOSPITALIZATION PROGRAM Office Visit from 06/17/2016 in [place] [facility] [place]  Total GAD-7 Score  6  18    PHQ2-9     Office Visit from 07/14/2017 in [place] [facility] [place] Counselor from 07/08/2016 in BEHAVIORAL HEALTH PARTIAL HOSPITALIZATION PROGRAM Office Visit from 06/17/2016 in [place] [facility] [place] Office Visit from 09/07/2014 in [facility]  PHQ-2 Total Score  4  2  6  5   PHQ-9 Total Score  12  5  27  11        Assessment and Plan:  Victoria Barnes presents with improved mood stability on Lamictal 250 mg daily.  She self titrated to 250 mg, instead of the instructed 200 mg.  She reports that she feels stable at this dose and does not have any toxic effects, so we agreed to continue.  She presents euthymic today and does not present with any rapid speech or racing thoughts.  She has had a significant reduction in her risky behaviors and impulsivity.  We agreed to restart the Adderall which she has periodically been taking, she typically takes 30 mg in the morning and this helps with her productivity focus and sense of calm at work.  She does not have any acute safety issues.  She is actively engaged in individual therapy.  We will  proceed as below and follow-up in 3 months.  1. Bipolar 1 disorder (HCC)   2. Attention deficit hyperactivity disorder (  ADHD), combined type     Status of current problems: gradually improving  Labs Ordered: No orders of the defined types were placed in this encounter.   Labs Reviewed: n/a  Collateral Obtained/Records Reviewed: Reviewed therapy notes  Plan:  Continue Lamictal 250 mg nightly Continue trazodone 50-100 mg nightly Restart Adderall 30 mg 1-2 times daily for ADHD Return to clinic in 3 months Continue individual therapy  I spent 25 minutes with the patient in direct face-to-face clinical care.  Greater than 50% of this time was spent in counseling and coordination of care with the patient.   Burnard LeighAlexander Arya Eksir, MD 09/10/2017, 3:34 PM

## 2017-09-11 ENCOUNTER — Encounter: Payer: Self-pay | Admitting: Physician Assistant

## 2017-09-11 LAB — CYTOLOGY - PAP
Diagnosis: NEGATIVE
HPV: NOT DETECTED

## 2017-09-11 NOTE — Progress Notes (Signed)
Your Pap smear is normal. Repeat in 2-3 years.

## 2017-09-14 ENCOUNTER — Ambulatory Visit (HOSPITAL_COMMUNITY): Payer: Self-pay | Admitting: Licensed Clinical Social Worker

## 2017-09-18 MED FILL — AMPHETAMINE SALTS 30 MG TAB: 30 | 30 days supply | Qty: 60 | Fill #0

## 2017-09-18 MED FILL — FLUCONAZOLE 150 MG TABLET: 150 | 3 days supply | Qty: 2 | Fill #0

## 2017-09-21 ENCOUNTER — Ambulatory Visit (INDEPENDENT_AMBULATORY_CARE_PROVIDER_SITE_OTHER): Payer: 59 | Admitting: Licensed Clinical Social Worker

## 2017-09-21 DIAGNOSIS — F902 Attention-deficit hyperactivity disorder, combined type: Secondary | ICD-10-CM | POA: Diagnosis not present

## 2017-09-21 DIAGNOSIS — F319 Bipolar disorder, unspecified: Secondary | ICD-10-CM

## 2017-09-22 ENCOUNTER — Encounter (HOSPITAL_COMMUNITY): Payer: Self-pay | Admitting: Licensed Clinical Social Worker

## 2017-09-22 NOTE — Progress Notes (Signed)
   THERAPIST PROGRESS NOTE  Session Time: 11-12  Participation Level: Active  Behavioral Response: Neat and Well GroomedAlertIrritable  Type of Therapy: Individual Therapy  Treatment Goals addressed: Anxiety  Interventions: CBT, Strength-based and Supportive  Summary: Victoria Barnes is a 31 y.o. female who presents with rigid aggressive thinking, frustration w/ others, instability in her relationships, and poor self image. She reports recurrent issues around race, identity, and culture given that she is a black female who feels oppressed and that opportunities have been kept from her due to her race.   Pt states she has started her new job and it is going well. Although, she is "currently in an online argument w/ a former coworker" who will not stop talking about the pt to coworkers. Pt states she "may have to beat her ass" if the person does not stop talking about her. Pt described her feelings as "a tightness in her chest" that she feels she has to get out physically.   Pt states she "cried all week" after last session w/ this therapist and felt "very overwhelmed". Pt has multiple automatic negative thoughts such as "crying is weak/shameful" and "people need to act the way I want them to".   Pt asked counselor to "reel her in and help sessions become more focused".    Suicidal/Homicidal: Nowithout intent/plan  Therapist Response: Counselor used CBT, challenged her negative automatic thoughts, and briefly discussed her core beliefs that were established in early childhood. Pt admitted she wants to "worry less about what other people think". Pt states she wants to get at the roots of her negative thinking. Counselor reflected that next session could focus on her early experiences in childhood that led to her negative attitude towards her emotions.   Plan: Return again in 2 weeks.  Diagnosis:    ICD-10-CM   1. Bipolar 1 disorder (HCC) F31.9   2. Attention deficit hyperactivity  disorder (ADHD), combined type F90.2        Victoria CommonWesley E Aurelie Dicenzo, LCAS-A 09/22/2017

## 2017-09-28 ENCOUNTER — Ambulatory Visit (HOSPITAL_COMMUNITY): Payer: Self-pay | Admitting: Licensed Clinical Social Worker

## 2017-10-01 ENCOUNTER — Encounter (HOSPITAL_COMMUNITY): Payer: Self-pay | Admitting: Psychiatry

## 2017-10-01 DIAGNOSIS — F3130 Bipolar disorder, current episode depressed, mild or moderate severity, unspecified: Secondary | ICD-10-CM

## 2017-10-01 MED ORDER — LURASIDONE HCL 20 MG PO TABS: 20.0000 mg | ORAL_TABLET | Freq: Every day | ORAL | 1 refills | Status: DC

## 2017-10-01 NOTE — Telephone Encounter (Signed)
Spoke with patient regarding her rash.  She denies any discrete mucocutaneous involvement, but does report that her eyes have been more irritated over the past 2 weeks.  She does not visualize any burning or painful sensation in her eyelids or in her gums unless her eyelids tear up.  She also does have allergies.  She reports the rashes on her chest as well.  I educated her to promptly discontinue the medicine, and she was agreeable.  I also educated her on Stevens-Johnson syndrome, and how this can progress to a more aggressive rash that requires emergency department visit, corticosteroids, and possible hospital admission.  I encouraged her to take in adequate fluids, take Benadryl, and if the rash continues to progress over the next 1-2 days, I recommend ER visit.  Regarding her mood, she is concerned about promptly discontinuing Lamictal, particularly concerned that she may have a return of irritability and mood lability.  She continues to feel depressed and dysphoric despite continued Lamictal.  We agreed to initiate low-dose Latuda 20 mg daily for bipolar depression and will schedule a follow-up sooner.

## 2017-10-13 ENCOUNTER — Ambulatory Visit (HOSPITAL_COMMUNITY): Payer: Self-pay | Admitting: Psychiatry

## 2017-10-14 ENCOUNTER — Ambulatory Visit (INDEPENDENT_AMBULATORY_CARE_PROVIDER_SITE_OTHER): Payer: 59 | Admitting: Psychiatry

## 2017-10-14 ENCOUNTER — Encounter (HOSPITAL_COMMUNITY): Payer: Self-pay | Admitting: Psychiatry

## 2017-10-14 VITALS — BP 98/60 | HR 81 | Ht 64.0 in | Wt 118.6 lb

## 2017-10-14 DIAGNOSIS — Z975 Presence of (intrauterine) contraceptive device: Secondary | ICD-10-CM | POA: Diagnosis not present

## 2017-10-14 DIAGNOSIS — F902 Attention-deficit hyperactivity disorder, combined type: Secondary | ICD-10-CM | POA: Diagnosis not present

## 2017-10-14 DIAGNOSIS — Z810 Family history of intellectual disabilities: Secondary | ICD-10-CM

## 2017-10-14 DIAGNOSIS — F3131 Bipolar disorder, current episode depressed, mild: Secondary | ICD-10-CM

## 2017-10-14 DIAGNOSIS — Z818 Family history of other mental and behavioral disorders: Secondary | ICD-10-CM

## 2017-10-14 DIAGNOSIS — Z87891 Personal history of nicotine dependence: Secondary | ICD-10-CM

## 2017-10-14 MED ORDER — LURASIDONE HCL 20 MG PO TABS: 20.0000 mg | ORAL_TABLET | Freq: Every day | ORAL | 1 refills | Status: DC

## 2017-10-14 NOTE — Progress Notes (Signed)
BH MD/PA/NP OP Progress Note  10/14/2017 11:29 AM Victoria Barnes  MRN:  [id number]  Chief Complaint: Doing okay  HPI: Patient reports that her rash is gone, and her eyelids are feeling better.  She reports that she has been a little bit tired because of being off of Lamictal.  She is not taking anything, no trazodone, no Adderall, no Latuda.  She would like to give her brain a few months to washout and see how she is doing.  She will continue in individual therapy regularly.  Patient reports that she does have some scarring from a rash on her shoulders, possibly related to Lamictal.  She reports that she did not realize she was forming a rash for many weeks until it affected her eyelids.  I discussed with her that if she feels the need to start Latuda, she can go ahead and start 20 mg tablet daily.  We once again reviewed the risks and benefits, and that this is a class of medicine atypical antipsychotic.  We have follow-up scheduled in 8 weeks and will check in on her mood and symptom management then.  She denies any suicidal thoughts or unsafe thoughts.  Visit Diagnosis:    ICD-10-CM   1. Bipolar affective disorder, currently depressed, mild (HCC) F31.31 lurasidone (LATUDA) 20 MG TABS tablet  2. Attention deficit hyperactivity disorder (ADHD), combined type F90.2     Past Psychiatric History: See intake H&P for full details. Reviewed, with no updates at this time.   Past Medical History:  Past Medical History:  Diagnosis Date  . ADHD   . Bipolar I disorder (HCC)   . Depression   . Eczema 2012  . Migraine without status migrainosus, not intractable 02/26/2016    Past Surgical History:  Procedure Laterality Date  . BREAST ENHANCEMENT SURGERY Bilateral June 2015  . WISDOM TOOTH EXTRACTION      Family Psychiatric History: See intake H&P for full details. Reviewed, with no updates at this time.   Family History:  Family History  Problem Relation Age of Onset  . Hyperlipidemia  Mother   . Personality disorder Maternal Grandmother   . Depression Maternal Grandmother   . Mood Disorder Maternal Grandmother     Social History:  Social History   Socioeconomic History  . Marital status: Single    Spouse name: None  . Number of children: None  . Years of education: None  . Highest education level: None  Social Needs  . Financial resource strain: Not hard at all  . Food insecurity - worry: Never true  . Food insecurity - inability: Never true  . Transportation needs - medical: No  . Transportation needs - non-medical: No  Occupational History  . Occupation: CMA  Tobacco Use  . Smoking status: Former Smoker    Types: Cigarettes    Last attempt to quit: 11/2014    Years since quitting: 2.9  . Smokeless tobacco: Never Used  . Tobacco comment: quit 2 years  Substance and Sexual Activity  . Alcohol use: Yes    Comment: once or twice a month - socially  . Drug use: No  . Sexual activity: Yes    Partners: Male    Birth control/protection: IUD    Comment: condoms  Other Topics Concern  . None  Social History Narrative  . None    Allergies:  Allergies  Allergen Reactions  . Lamictal [Lamotrigine] Rash    Possible SJS  . Antacid [Alum & Mag Hydroxide-Simeth] Nausea  And Vomiting  . Keflex [Cephalexin] Nausea And Vomiting  . Penicillins Nausea And Vomiting  . Trintellix [Vortioxetine]     Swelling/numbness and tingling of extremities.   Victoria Barnes. Viibryd [Vilazodone Hcl]     Abnormal Frightening Dreams    Metabolic Disorder Labs: No results found for: HGBA1C, MPG No results found for: PROLACTIN Lab Results  Component Value Date   CHOL 177 09/07/2017   TRIG 38 09/07/2017   HDL 71 09/07/2017   CHOLHDL 2.5 09/07/2017   VLDL 6 09/08/2016   LDLCALC 111 (H) 09/08/2016   LDLCALC 89 06/18/2015   Lab Results  Component Value Date   TSH 1.62 09/07/2017   TSH 2.06 02/23/2017    Therapeutic Level Labs: No results found for: LITHIUM No results found  for: VALPROATE No components found for:  CBMZ  Current Medications: Current Outpatient Medications  Medication Sig Dispense Refill  . Clobetasol Prop Emollient Base (CLOBETASOL PROPIONATE E) 0.05 % emollient cream Use twice a day only as needed for eczema. 30 g 6  . fexofenadine (ALLEGRA ALLERGY) 180 MG tablet Take 1 tablet (180 mg total) by mouth daily. 90 tablet 3  . fluconazole (DIFLUCAN) 150 MG tablet Take 1 tablet (150 mg total) daily by mouth. 1 tablet 1  . fluticasone (FLONASE) 50 MCG/ACT nasal spray Place 1 spray into both nostrils daily. 15.8 g 2  . Ginkgo Biloba 40 MG TABS Take by mouth.    . Iron-FA-B Cmp-C-Biot-Probiotic (FUSION PLUS) CAPS Take 1 tablet by mouth daily. 30 capsule 11  . Levonorgestrel (SKYLA) 13.5 MG IUD by Intrauterine route.    [name]. PREVIDENT 5000 BOOSTER PLUS 1.1 % PSTE   12  . rizatriptan (MAXALT-MLT) 5 MG disintegrating tablet Take 1 tablet (5 mg total) by mouth as needed for migraine. May repeat in 2 hours if needed 10 tablet 3  . tacrolimus (PROTOPIC) 0.1 % ointment Apply topically 2 (two) times daily. Only as needed to eczema spots. 100 g 0  . valACYclovir (VALTREX) 1000 MG tablet TAKE 2 TABLETS TWICE A DAY FOR ONE DAY FOR OUTBREAK. 8 tablet 0  . linaclotide (LINZESS) 290 MCG CAPS capsule Take 1 capsule (290 mcg total) by mouth daily. (Patient not taking: Reported on 10/14/2017) 30 capsule 3  . lurasidone (LATUDA) 20 MG TABS tablet Take 1 tablet (20 mg total) by mouth at bedtime. 60 tablet 1   No current facility-administered medications for this visit.      Musculoskeletal: Strength & Muscle Tone: within normal limits Gait & Station: normal Patient leans: N/A  Psychiatric Specialty Exam: ROS  Blood pressure 98/60, pulse 81, height 5\' 4"  (1.626 m), weight 118 lb 9.6 oz (53.8 kg).Body mass index is 20.36 kg/m.  General Appearance: Casual and Well Groomed  Eye Contact:  Fair  Speech:  Clear and Coherent  Volume:  Normal  Mood:  Dysphoric  Affect:   Congruent  Thought Process:  Goal Directed and Descriptions of Associations: Intact  Orientation:  Full (Time, Place, and Person)  Thought Content: Logical   Suicidal Thoughts:  No  Homicidal Thoughts:  No  Memory:  Immediate;   Fair  Judgement:  Fair  Insight:  Shallow  Psychomotor Activity:  Normal  Concentration:  Concentration: Fair  Recall:  FiservFair  Fund of Knowledge: Fair  Language: Fair  Akathisia:  Negative  Handed:  Right  AIMS (if indicated): not done  Assets:  Communication Skills Desire for Improvement Financial Resources/Insurance Housing Physical Health Vocational/Educational  ADL's:  Intact  Cognition: WNL  Sleep:  Good   Screenings: GAD-7     Counselor from 07/08/2016 in BEHAVIORAL HEALTH PARTIAL HOSPITALIZATION PROGRAM Office Visit from 06/17/2016 in [facility] [place]  Total GAD-7 Score  6  18    PHQ2-9     Office Visit from 07/14/2017 in [place] [facility] [place] Counselor from 07/08/2016 in BEHAVIORAL HEALTH PARTIAL HOSPITALIZATION PROGRAM Office Visit from 06/17/2016 in [place] [facility] [place] Office Visit from 09/07/2014 in [facility]  PHQ-2 Total Score  4  2  6  5   PHQ-9 Total Score  12  5  27  11        Assessment and Plan:  Victoria Barnes is a 31 year old female with bipolar disorder and possible ADHD.  She presents today for follow-up after forming a skin rash from Lamictal.  She has discontinued therapy and the rash has resolved.  I have added this to her allergy list.  We discussed initiating Latuda for bipolar depression, she continues to struggle with dysphoria, irritability, depressed mood, and anhedonia.  She reports that she wants to avoid medications for the time being, and work on building her coping skills with individual therapy.  She does not present with any suicidality, and is fairly reasonable in discussing a treatment plan.  She agrees to  initiate Latuda if she notices a decline in her mood or increased agitation.  1. Bipolar affective disorder, currently depressed, mild (HCC)   2. Attention deficit hyperactivity disorder (ADHD), combined type     Status of current problems: unchanged  Labs Ordered: No orders of the defined types were placed in this encounter.   Labs Reviewed: n/a  Collateral Obtained/Records Reviewed: n/a  Plan:  Latuda 20 mg daily; patient wishes to hold off for now Hold Adderall 30 mg BID Continue individual therapy Follow-up in 2 months  I spent 15 minutes with the patient in direct face-to-face clinical care.  Greater than 50% of this time was spent in counseling and coordination of care with the patient.    Burnard Leigh, MD 10/14/2017, 11:29 AM

## 2017-10-16 ENCOUNTER — Telehealth (HOSPITAL_COMMUNITY): Payer: Self-pay

## 2017-10-16 ENCOUNTER — Ambulatory Visit (INDEPENDENT_AMBULATORY_CARE_PROVIDER_SITE_OTHER): Payer: 59 | Admitting: Licensed Clinical Social Worker

## 2017-10-16 DIAGNOSIS — F902 Attention-deficit hyperactivity disorder, combined type: Secondary | ICD-10-CM

## 2017-10-16 DIAGNOSIS — F3131 Bipolar disorder, current episode depressed, mild: Secondary | ICD-10-CM

## 2017-10-16 MED FILL — metroNIDAZOLE 0.75 % GEL: 0.75 | 5 days supply | Qty: 70 | Fill #2

## 2017-10-16 MED FILL — traZODone HCL 50 MG TABS: 50 | 30 days supply | Qty: 30 | Fill #2

## 2017-10-16 MED FILL — FLUCONAZOLE 150 MG TABLET: 150 | 1 days supply | Qty: 1 | Fill #1

## 2017-10-16 MED FILL — RIZATRIPTAN 5 MG ODT: 5 | 30 days supply | Qty: 10 | Fill #1

## 2017-10-16 NOTE — Telephone Encounter (Signed)
Thank you :)

## 2017-10-16 NOTE — Telephone Encounter (Signed)
Prior authorization for Latuda submitted online with covermymeds and faxed to [employer] from covermymeds.  Decision pending and may take up to 5 days for response.

## 2017-10-19 ENCOUNTER — Encounter (HOSPITAL_COMMUNITY): Payer: Self-pay | Admitting: Licensed Clinical Social Worker

## 2017-10-19 NOTE — Progress Notes (Signed)
   THERAPIST PROGRESS NOTE  Session Time: 10-11  Participation Level: Active  Behavioral Response: Neat and Well GroomedAlertEuthymic  Type of Therapy: Individual Therapy  Treatment Goals addressed: Anxiety  Interventions: CBT and DBT  Summary: Victoria Barnes is a 31 y.o. female who presents with recent symptoms of depression and anxiety. Pt presents as somewhat flat but engaged during session. She reports she has stopped taking all her medications and states she is "irritated that her reaction to medicine could have caused serious damage to her skin". Pt states she is "doing a detox of all her medications" and feeling much better about her mental health than a few weeks ago.  Pt reports she is "learning to go w/ the flow more" and not letting her anger and anxiety effect her performance at work or w/ her son. She states she is feeling "more capable of handling herself".  Counselor and pt discuss her relationship w/ her adulterer who she is currently not seeing but "feels a craving to go see him frequently".  She states she does not want to see him but feels conflicted since she does not know how to "be fully satisfied by her current boyfriend". Pt and counselor discuss the difference b/w emotional and sexual intimacy and what pt's adulterer provides her in a relationship.   Counselor works to keep pt more focused and on track w/ discussing her feelings and conflicting beliefs.  Suicidal/Homicidal: Nowithout intent/plan  Therapist Response: Counselor used CBT to help pt gain insight into her conflicting beliefs and irrational thinking. Counselor assessed for pt level of functioning which appears to be fairly high. Counselor expressed concern for pt stopping all medications but offered supportive feedback around her taking care of herself.  Plan: Return again in 2 weeks.  Diagnosis:    ICD-10-CM   1. Bipolar affective disorder, currently depressed, mild (HCC) F31.31   2. Attention  deficit hyperactivity disorder (ADHD), combined type F90.2        Victoria Barnes, LCAS-A 10/19/2017

## 2017-10-28 MED FILL — metroNIDAZOLE 0.75 % GEL: 0.75 | 5 days supply | Qty: 70 | Fill #3

## 2017-10-28 MED FILL — AMPHETAMINE SALTS 30 MG TAB: 30 | 30 days supply | Qty: 60 | Fill #0

## 2017-10-30 ENCOUNTER — Ambulatory Visit (HOSPITAL_COMMUNITY): Payer: Self-pay | Admitting: Licensed Clinical Social Worker

## 2017-11-13 MED FILL — FLUCONAZOLE 150 MG TABLET: 150 | 3 days supply | Qty: 2 | Fill #1

## 2017-12-10 ENCOUNTER — Encounter (HOSPITAL_COMMUNITY): Payer: Self-pay | Admitting: Psychiatry

## 2017-12-10 ENCOUNTER — Ambulatory Visit (HOSPITAL_COMMUNITY): Payer: Self-pay | Admitting: Psychiatry

## 2017-12-10 ENCOUNTER — Ambulatory Visit (HOSPITAL_COMMUNITY): Payer: No Typology Code available for payment source | Admitting: Psychiatry

## 2017-12-10 DIAGNOSIS — F308 Other manic episodes: Secondary | ICD-10-CM

## 2017-12-10 DIAGNOSIS — Z87891 Personal history of nicotine dependence: Secondary | ICD-10-CM

## 2017-12-10 DIAGNOSIS — Z818 Family history of other mental and behavioral disorders: Secondary | ICD-10-CM

## 2017-12-10 DIAGNOSIS — F901 Attention-deficit hyperactivity disorder, predominantly hyperactive type: Secondary | ICD-10-CM | POA: Diagnosis not present

## 2017-12-10 DIAGNOSIS — F3181 Bipolar II disorder: Secondary | ICD-10-CM | POA: Diagnosis not present

## 2017-12-10 DIAGNOSIS — Z975 Presence of (intrauterine) contraceptive device: Secondary | ICD-10-CM | POA: Diagnosis not present

## 2017-12-10 MED ORDER — QUETIAPINE FUMARATE 100 MG PO TABS: 100.0000 mg | ORAL_TABLET | Freq: Every day | ORAL | 1 refills | Status: DC

## 2017-12-10 MED ORDER — AMPHETAMINE-DEXTROAMPHETAMINE 30 MG PO TABS: 30.0000 mg | ORAL_TABLET | Freq: Two times a day (BID) | ORAL | 0 refills | Status: DC

## 2017-12-10 MED FILL — QUETIAPINE FUMARATE 100 MG: 100 | 90 days supply | Qty: 90 | Fill #0

## 2017-12-10 MED FILL — AMPHETAMINE SALTS 30 MG TAB: 30 | 30 days supply | Qty: 60 | Fill #0

## 2017-12-10 NOTE — Progress Notes (Signed)
BH MD/PA/NP OP Progress Note  12/10/2017 3:57 PM Victoria Barnes  MRN:  [id number]  Chief Complaint: med management  HPI: Victoria Barnes reports that she has had some decreased need for sleep over the past 5-7 days, and has noticed she has been purchasing more items.  She does not present with acute mania, but presents with some symptoms of hypomania, and slight hyperactivity.  No delusional thinking or grandiosity.  She does note that she had been more sexually promiscuous last week, sleeping with her ex-boyfriend.  She reflects on this with shame.  She never started the Jordan because she was nervous about it, and wanted to avoid new medications.  Also reminded her of Lamictal she was nervous about Lamictal because of the skin rash she got.  I suggested we use low-dose Seroquel at night for sleep on an as-needed basis given that trazodone has not been providing her with any benefit.  She was agreeable to this and we reviewed the risks and benefits particularly metabolic effects.  With regard to Adderall, she reports that she uses that very occasionally, and I recommended that she does not use Adderall for the next week while her mood has been more elevated.  Again reiterating that she presents with complex symptoms suggestive of a bipolar spectrum illness but also comorbid ADHD when she is euthymic.  No acute safety issues or suicidality.  She is not concerned about STDs.  We will follow-up in 3 months or sooner if needed.  Visit Diagnosis:    ICD-10-CM   1. Hypomania (HCC) F30.8 QUEtiapine (SEROQUEL) 100 MG tablet  2. Bipolar 2 disorder (HCC) F31.81 QUEtiapine (SEROQUEL) 100 MG tablet  3. Attention deficit hyperactivity disorder (ADHD), predominantly hyperactive type F90.1 amphetamine-dextroamphetamine (ADDERALL) 30 MG tablet   Past Psychiatric History: See intake H&P for full details. Reviewed, with no updates at this time.  Past Medical History:  Past Medical History:  Diagnosis Date  .  ADHD   . Bipolar I disorder (HCC)   . Depression   . Eczema 2012  . Migraine without status migrainosus, not intractable 02/26/2016    Past Surgical History:  Procedure Laterality Date  . BREAST ENHANCEMENT SURGERY Bilateral June 2015  . WISDOM TOOTH EXTRACTION     Family Psychiatric History: See intake H&P for full details. Reviewed, with no updates at this time.  Family History:  Family History  Problem Relation Age of Onset  . Hyperlipidemia Mother   . Personality disorder Maternal Grandmother   . Depression Maternal Grandmother   . Mood Disorder Maternal Grandmother    Social History:  Social History   Socioeconomic History  . Marital status: Single    Spouse name: None  . Number of children: None  . Years of education: None  . Highest education level: None  Social Needs  . Financial resource strain: Not hard at all  . Food insecurity - worry: Never true  . Food insecurity - inability: Never true  . Transportation needs - medical: No  . Transportation needs - non-medical: No  Occupational History  . Occupation: CMA  Tobacco Use  . Smoking status: Former Smoker    Types: Cigarettes    Last attempt to quit: 11/2014    Years since quitting: 3.1  . Smokeless tobacco: Never Used  . Tobacco comment: quit 2 years  Substance and Sexual Activity  . Alcohol use: Yes    Comment: once or twice a month - socially  . Drug use: No  .  Sexual activity: Yes    Partners: Male    Birth control/protection: IUD    Comment: condoms  Other Topics Concern  . None  Social History Narrative  . None    Allergies:  Allergies  Allergen Reactions  . Lamictal [Lamotrigine] Rash    Possible SJS  . Antacid [Alum & Mag Hydroxide-Simeth] Nausea And Vomiting  . Keflex [Cephalexin] Nausea And Vomiting  . Penicillins Nausea And Vomiting  . Trintellix [Vortioxetine]     Swelling/numbness and tingling of extremities.   Elliot Cousin Hcl]     Abnormal Frightening Dreams     Metabolic Disorder Labs: No results found for: HGBA1C, MPG No results found for: PROLACTIN Lab Results  Component Value Date   CHOL 177 09/07/2017   TRIG 38 09/07/2017   HDL 71 09/07/2017   CHOLHDL 2.5 09/07/2017   VLDL 6 09/08/2016   LDLCALC 111 (H) 09/08/2016   LDLCALC 89 06/18/2015   Lab Results  Component Value Date   TSH 1.62 09/07/2017   TSH 2.06 02/23/2017    Therapeutic Level Labs: No results found for: LITHIUM No results found for: VALPROATE No components found for:  CBMZ  Current Medications: Current Outpatient Medications  Medication Sig Dispense Refill  . amphetamine-dextroamphetamine (ADDERALL) 30 MG tablet Take 1 tablet by mouth 2 (two) times daily. 60 tablet 0  . Clobetasol Prop Emollient Base (CLOBETASOL PROPIONATE E) 0.05 % emollient cream Use twice a day only as needed for eczema. 30 g 6  . fexofenadine (ALLEGRA ALLERGY) 180 MG tablet Take 1 tablet (180 mg total) by mouth daily. 90 tablet 3  . fluconazole (DIFLUCAN) 150 MG tablet Take 1 tablet (150 mg total) daily by mouth. 1 tablet 1  . fluticasone (FLONASE) 50 MCG/ACT nasal spray Place 1 spray into both nostrils daily. 15.8 g 2  . Ginkgo Biloba 40 MG TABS Take by mouth.    . Iron-FA-B Cmp-C-Biot-Probiotic (FUSION PLUS) CAPS Take 1 tablet by mouth daily. 30 capsule 11  . Levonorgestrel (SKYLA) 13.5 MG IUD by Intrauterine route.    . linaclotide (LINZESS) 290 MCG CAPS capsule Take 1 capsule (290 mcg total) by mouth daily. (Patient not taking: Reported on 10/14/2017) 30 capsule 3  . PREVIDENT 5000 BOOSTER PLUS 1.1 % PSTE   12  . QUEtiapine (SEROQUEL) 100 MG tablet Take 1 tablet (100 mg total) by mouth at bedtime. 90 tablet 1  . rizatriptan (MAXALT-MLT) 5 MG disintegrating tablet Take 1 tablet (5 mg total) by mouth as needed for migraine. May repeat in 2 hours if needed 10 tablet 3  . tacrolimus (PROTOPIC) 0.1 % ointment Apply topically 2 (two) times daily. Only as needed to eczema spots. 100 g 0  .  valACYclovir (VALTREX) 1000 MG tablet TAKE 2 TABLETS TWICE A DAY FOR ONE DAY FOR OUTBREAK. 8 tablet 0   No current facility-administered medications for this visit.      Musculoskeletal: Strength & Muscle Tone: within normal limits Gait & Station: normal Patient leans: N/A  Psychiatric Specialty Exam: ROS  There were no vitals taken for this visit.There is no height or weight on file to calculate BMI.  General Appearance: Casual and Fairly Groomed  Eye Contact:  Fair  Speech:  Clear and Coherent and Normal Rate  Volume:  Normal  Mood:  Dysphoric  Affect:  Appropriate and Congruent  Thought Process:  Goal Directed and Descriptions of Associations: Intact  Orientation:  Full (Time, Place, and Person)  Thought Content: Logical   Suicidal Thoughts:  No  Homicidal Thoughts:  No  Memory:  Immediate;   Fair  Judgement:  Fair  Insight:  Shallow  Psychomotor Activity:  Normal  Concentration:  Concentration: Fair  Recall:  Fis[employer]Fair  Fund of Knowledge: Fair  Language: Good  Akathisia:  Negative  Handed:  Right  AIMS (if indicated): n/a  Assets:  Communication Skills Desire for Improvement Financial Resources/Insurance Housing Transportation Vocational/Educational  ADL's:  Intact  Cognition: WNL  Sleep:  Good   Screenings: GAD-7     Counselor from 07/08/2016 in [facility] Office Visit from 06/17/2016 in [facility] [place]  Total GAD-7 Score  6  18    PHQ2-9     Office Visit from 07/14/2017 in [facility] [place] Counselor from 07/08/2016 in BEHAVIORAL HEALTH PARTIAL HOSPITALIZATION PROGRAM Office Visit from 06/17/2016 in [facility]  [facility]  PHQ-2 Total Score  4  2  6  5   PHQ-9 Total Score  12  5  27  11       Assessment and Plan: Victoria Barnes presents with some hypomania associated  with decreased need for sleep, sleeping around 4-6 hours instead of her usual 8.  She is also noted she is a little bit more promiscuous, and has been more loose with spending, purchasing items online that were not necessary.  She presents with cheerful affect, and feels "really good" but is not grossly disorganized, grandiose, delusional, paranoid, and does not present with rapid racing thoughts or thinking.  Patient has a complex presentation with ADHD symptoms during euthymia, and periods of hypomania.  She shared with Clinical research associatewriter today that she learned her grandmother was diagnosed and struggled with bipolar disorder.  We will proceed as below and follow-up in 10-12 weeks, and she continues to engage in individual therapy.  Today we will start Seroquel for sleep given that trazodone has not been particularly effective.  I continue to encourage her to consider a mood stabilizing agent, and she may be a good candidate for lithium if she is willing.  1. Hypomania (HCC)   2. Bipolar 2 disorder (HCC)   3. Attention deficit hyperactivity disorder (ADHD), predominantly hyperactive type    Status of current problems: unchanged  Labs Ordered: No orders of the defined types were placed in this encounter.  Labs Reviewed: n/a  Collateral Obtained/Records Reviewed: n/a  Plan:  Hold Adderall during episodes of elevated mood and decreased need for sleep Adderall 15-30 mg twice a day for ADHD during euthymia Seroquel 100 mg nightly for sleep Continue to encourage consideration of mood stabilizing agent, possibly lithium  I spent 25 minutes with the patient in direct face-to-face clinical care.  Greater than 50% of this time was spent in counseling and coordination of care with the patient.    Burnard LeighAlexander Arya Raelin Pixler, MD 12/10/2017, 3:57 PM

## 2017-12-14 ENCOUNTER — Encounter: Payer: Self-pay | Admitting: Family Medicine

## 2017-12-14 ENCOUNTER — Ambulatory Visit (INDEPENDENT_AMBULATORY_CARE_PROVIDER_SITE_OTHER): Payer: No Typology Code available for payment source | Admitting: Family Medicine

## 2017-12-14 VITALS — BP 132/74 | HR 108 | Temp 98.7°F | Wt 116.0 lb

## 2017-12-14 DIAGNOSIS — R6889 Other general symptoms and signs: Secondary | ICD-10-CM | POA: Diagnosis not present

## 2017-12-14 DIAGNOSIS — N760 Acute vaginitis: Secondary | ICD-10-CM

## 2017-12-14 LAB — POCT INFLUENZA A/B
INFLUENZA A, POC: NEGATIVE
INFLUENZA B, POC: NEGATIVE

## 2017-12-14 LAB — WET PREP FOR TRICH, YEAST, CLUE

## 2017-12-14 MED ORDER — HYDROCODONE-HOMATROPINE 5-1.5 MG/5ML PO SYRP: 5.0000 mL | ORAL_SOLUTION | Freq: Every evening | ORAL | 0 refills | Status: DC | PRN

## 2017-12-14 MED FILL — HYDROCODONE-HOMATROPINE SYR: 5-1.5 | 24 days supply | Qty: 120 | Fill #0

## 2017-12-14 NOTE — Progress Notes (Signed)
wewq

## 2017-12-14 NOTE — Progress Notes (Signed)
   Subjective:    Patient ID: Victoria Barnes, female    DOB: [birth year], 32 y.o.   MRN: [id number]  HPI 32 year old female comes in today complaining of flulike symptoms that started yesterday morning.  A irritated throat.  It was felt like she was is having a lot of reflux.  By the evening she was actually starting to cough and then ran a fever of 100.7 last night.  She actually feels nauseated and did throw up once last night.  No diarrhea.  No significant runny nose.  She just feels like she has a lot of mucus or phlegm in her throat.  She is not currently taking any cough medications.  She did take some Tylenol yesterday for the fever.  Also complaining of some vaginal irritation and would like to be tested with a wet prep for yeast.    Review of Systems     Objective:   Physical Exam  Constitutional: She is oriented to person, place, and time. She appears well-developed and well-nourished.  HENT:  Head: Normocephalic and atraumatic.  Right Ear: External ear normal.  Left Ear: External ear normal.  Nose: Nose normal.  Mouth/Throat: Oropharynx is clear and moist.  TMs and canals are clear.   Eyes: Conjunctivae and EOM are normal. Pupils are equal, round, and reactive to light.  Neck: Neck supple. No thyromegaly present.  Cardiovascular: Normal rate, regular rhythm and normal heart sounds.  Pulmonary/Chest: Effort normal and breath sounds normal. She has no wheezes.  Lymphadenopathy:    She has no cervical adenopathy.  Neurological: She is alert and oriented to person, place, and time.  Skin: Skin is warm and dry.  Psychiatric: She has a normal mood and affect.          Assessment & Plan:  Upper respiratory infection with fever-at this point rapid flu test was negative.  Recommend symptomatic therapy.  Tylenol or ibuprofen as needed.  Rest and hydration.  If not improving in 5 days then please let us know.  Needs to be out of work until at least fever free for 24  hours.  Vaginitis-wet prep performed.  Call with results once available.

## 2017-12-14 NOTE — Patient Instructions (Addendum)
Upper Respiratory Infection, Adult Most upper respiratory infections (URIs) are caused by a virus. A URI affects the nose, throat, and upper air passages. The most common type of URI is often called "the common cold." Follow these instructions at home:  Take medicines only as told by your doctor.  Gargle warm saltwater or take cough drops to comfort your throat as told by your doctor.  Use a warm mist humidifier or inhale steam from a shower to increase air moisture. This may make it easier to breathe.  Drink enough fluid to keep your pee (urine) clear or pale yellow.  Eat soups and other clear broths.  Have a healthy diet.  Rest as needed.  Go back to work when your fever is gone or your doctor says it is okay. ? You may need to stay home longer to avoid giving your URI to others. ? You can also wear a face mask and wash your hands often to prevent spread of the virus.  Use your inhaler more if you have asthma.  Do not use any tobacco products, including cigarettes, chewing tobacco, or electronic cigarettes. If you need help quitting, ask your doctor. Contact a doctor if:  You are getting worse, not better.  Your symptoms are not helped by medicine.  You have chills.  You are getting more short of breath.  You have brown or red mucus.  You have yellow or brown discharge from your nose.  You have pain in your face, especially when you bend forward.  You have a fever.  You have puffy (swollen) neck glands.  You have pain while swallowing.  You have white areas in the back of your throat. Get help right away if:  You have very bad or constant: ? Headache. ? Ear pain. ? Pain in your forehead, behind your eyes, and over your cheekbones (sinus pain). ? Chest pain.  You have long-lasting (chronic) lung disease and any of the following: ? Wheezing. ? Long-lasting cough. ? Coughing up blood. ? A change in your usual mucus.  You have a stiff neck.  You have  changes in your: ? Vision. ? Hearing. ? Thinking. ? Mood. This information is not intended to replace advice given to you by your health care provider. Make sure you discuss any questions you have with your health care provider. Document Released: 04/07/2008 Document Revised: 06/22/2016 Document Reviewed: 01/25/2014 Elsevier Interactive Patient Education  2018 Elsevier Inc.  

## 2017-12-15 ENCOUNTER — Other Ambulatory Visit: Payer: Self-pay | Admitting: Family Medicine

## 2017-12-15 MED ORDER — METRONIDAZOLE 500 MG PO TABS: 500.0000 mg | ORAL_TABLET | Freq: Two times a day (BID) | ORAL | 0 refills | Status: DC

## 2017-12-15 MED FILL — metroNIDAZOLE 500 MG TABS: 500 | 7 days supply | Qty: 14 | Fill #0

## 2017-12-16 ENCOUNTER — Encounter: Payer: Self-pay | Admitting: Sports Medicine

## 2017-12-16 ENCOUNTER — Ambulatory Visit (INDEPENDENT_AMBULATORY_CARE_PROVIDER_SITE_OTHER): Payer: No Typology Code available for payment source | Admitting: Sports Medicine

## 2017-12-16 DIAGNOSIS — E860 Dehydration: Secondary | ICD-10-CM | POA: Insufficient documentation

## 2017-12-16 DIAGNOSIS — G43909 Migraine, unspecified, not intractable, without status migrainosus: Secondary | ICD-10-CM

## 2017-12-16 DIAGNOSIS — G43009 Migraine without aura, not intractable, without status migrainosus: Secondary | ICD-10-CM | POA: Diagnosis not present

## 2017-12-16 LAB — POCT URINALYSIS DIPSTICK
Bilirubin, UA: NEGATIVE
Blood, UA: NEGATIVE
Glucose, UA: NEGATIVE
Ketones, UA: NEGATIVE
Leukocytes, UA: NEGATIVE
Nitrite, UA: NEGATIVE
Protein, UA: NEGATIVE
Spec Grav, UA: <=1.005 — AB (ref 1.010–1.025)
Urobilinogen, UA: 0.2 E.U./dL
pH, UA: 6.5 (ref 5.0–8.0)

## 2017-12-16 LAB — POCT URINE PREGNANCY: Preg Test, Ur: NEGATIVE

## 2017-12-16 MED ORDER — KETOROLAC TROMETHAMINE 30 MG/ML IJ SOLN: 30.0000 mg | Freq: Once | INTRAMUSCULAR | Status: DC

## 2017-12-16 MED ORDER — KETOROLAC TROMETHAMINE 30 MG/ML IJ SOLN
30.0000 mg | Freq: Once | INTRAMUSCULAR | Status: AC
  Administered 2017-12-16: 30 mg via INTRAMUSCULAR

## 2017-12-16 MED ORDER — RIZATRIPTAN BENZOATE 10 MG PO TBDP: 10.0000 mg | ORAL_TABLET | ORAL | 3 refills | Status: DC | PRN

## 2017-12-16 MED ORDER — DEXAMETHASONE SODIUM PHOSPHATE 4 MG/ML IJ SOLN
4.0000 mg | Freq: Once | INTRAMUSCULAR | Status: AC
  Administered 2017-12-16: 4 mg via INTRAMUSCULAR

## 2017-12-16 MED FILL — RIZATRIPTAN 10 MG ODT: 10 | 25 days supply | Qty: 10 | Fill #0

## 2017-12-16 NOTE — Progress Notes (Addendum)
Subjective:    CC: Still feeling sick  HPI: This is a pleasant 32 year old female, for the past several days she has been feeling sick, rundown, influenza-like symptoms, with a negative flu test.  She has had some trouble keeping hydrated with oral fluids.  Symptoms are severe.  She is also complaining of photophobia, headache, nausea.  This does resemble her previous migraines.  She really does not have many, and declines any discussion of preventative treatment.  I think this is appropriate.  I reviewed the past medical history, family history, social history, surgical history, and allergies today and no changes were needed.  Please see the problem list section below in epic for further details.  Past Medical History: Past Medical History:  Diagnosis Date  . ADHD   . Bipolar I disorder (HCC)   . Depression   . Eczema 2012  . Migraine without status migrainosus, not intractable 02/26/2016   Past Surgical History: Past Surgical History:  Procedure Laterality Date  . BREAST ENHANCEMENT SURGERY Bilateral June 2015  . WISDOM TOOTH EXTRACTION     Social History: Social History   Socioeconomic History  . Marital status: Single    Spouse name: None  . Number of children: None  . Years of education: None  . Highest education level: None  Social Needs  . Financial resource strain: Not hard at all  . Food insecurity - worry: Never true  . Food insecurity - inability: Never true  . Transportation needs - medical: No  . Transportation needs - non-medical: No  Occupational History  . Occupation: CMA  Tobacco Use  . Smoking status: Former Smoker    Types: Cigarettes    Last attempt to quit: 11/2014    Years since quitting: 3.1  . Smokeless tobacco: Never Used  . Tobacco comment: quit 2 years  Substance and Sexual Activity  . Alcohol use: Yes    Comment: once or twice a month - socially  . Drug use: No  . Sexual activity: Yes    Partners: Male    Birth control/protection: IUD     Comment: condoms  Other Topics Concern  . None  Social History Narrative  . None   Family History: Family History  Problem Relation Age of Onset  . Hyperlipidemia Mother   . Personality disorder Maternal Grandmother   . Depression Maternal Grandmother   . Mood Disorder Maternal Grandmother    Allergies: Allergies  Allergen Reactions  . Lamictal [Lamotrigine] Rash    Possible SJS  . Antacid [Alum & Mag Hydroxide-Simeth] Nausea And Vomiting  . Keflex [Cephalexin] Nausea And Vomiting  . Penicillins Nausea And Vomiting  . Trintellix [Vortioxetine]     Swelling/numbness and tingling of extremities.   Elliot Cousin Hcl]     Abnormal Frightening Dreams   Medications: See med rec.  Review of Systems: No fevers, chills, night sweats, weight loss, chest pain, or shortness of breath.   Objective:    General: Well Developed, well nourished, and in no acute distress.  Neuro: Alert and oriented x3, extra-ocular muscles intact, sensation grossly intact.  HEENT: Normocephalic, atraumatic, pupils equal round reactive to light, neck supple, no masses, no lymphadenopathy, thyroid nonpalpable.  Oropharynx, nasopharynx, ear canals unremarkable Skin: Warm and dry, no rashes. Cardiac: Regular rate and rhythm, no murmurs rubs or gallops, no lower extremity edema.  Respiratory: Clear to auscultation bilaterally. Not using accessory muscles, speaking in full sentences.  20-gauge angiocatheter placed in the right cubital vein, 2 L of  normal saline infused over half an hour.  Impression and Recommendations:    Dehydration 2 L normal saline infused. Negative urinalysis, urine pregnancy test. Over-the-counter cold and flu medications, electrolyte-containing broths. Relative rest. Return as needed.  Migraine without status migrainosus, not intractable I think some of her symptoms are related to acute migraine, she really does not get them that often. Toradol 30, Decadron 4 given  intramuscular, along with the IV fluids above. She is complaining of headache, dizziness, nausea, as well as photophobia. I am going to refill her Maxalt. Return to see PCP as needed.  I spent 40 minutes with this patient, greater than 50% was face-to-face time counseling regarding the above diagnoses ___________________________________________ Ihor Austinhomas J. Benjamin Stainhekkekandam, M.D., ABFM., CAQSM. Primary Care and Sports Medicine [facility]  Adjunct Instructor of Family Medicine  [employer]

## 2017-12-16 NOTE — Assessment & Plan Note (Signed)
2 L normal saline infused. Negative urinalysis, urine pregnancy test. Over-the-counter cold and flu medications, electrolyte-containing broths. Relative rest. Return as needed.

## 2017-12-16 NOTE — Addendum Note (Signed)
Addended by: Monica BectonHEKKEKANDAM, THOMAS J on: 12/16/2017 10:29 AM   Modules accepted: Orders

## 2017-12-16 NOTE — Assessment & Plan Note (Signed)
I think some of her symptoms are related to acute migraine, she really does not get them that often. Toradol 30, Decadron 4 given intramuscular, along with the IV fluids above. She is complaining of headache, dizziness, nausea, as well as photophobia. I am going to refill her Maxalt. Return to see PCP as needed.

## 2017-12-16 NOTE — Addendum Note (Signed)
Addended by: Baird KayUGLAS, Manette Doto M on: 12/16/2017 10:32 AM   Modules accepted: Orders

## 2017-12-16 NOTE — Addendum Note (Signed)
Addended by: Baird KayUGLAS, Chayden Garrelts M on: 12/16/2017 11:32 AM   Modules accepted: Orders

## 2017-12-21 ENCOUNTER — Other Ambulatory Visit: Payer: Self-pay | Admitting: Physician Assistant

## 2017-12-21 ENCOUNTER — Telehealth: Payer: Self-pay | Admitting: *Deleted

## 2017-12-21 MED ORDER — AZITHROMYCIN 250 MG PO TABS: ORAL_TABLET | ORAL | 0 refills | Status: DC

## 2017-12-21 MED FILL — AZITHROMYCIN 250 MG TABS: 250 | 5 days supply | Qty: 6 | Fill #0

## 2017-12-21 NOTE — Telephone Encounter (Signed)
Pt called wanting to know if you'd send over an abx for her.  She is still having a productive cough with green-yellow phlegm.  [facility] is preferred.

## 2017-12-21 NOTE — Progress Notes (Signed)
Pt has been sick for over 7 days. Cough and sinus production is green. She would like abx. Sent zpak.

## 2017-12-22 ENCOUNTER — Other Ambulatory Visit: Payer: Self-pay | Admitting: *Deleted

## 2017-12-22 MED ORDER — FLUCONAZOLE 150 MG PO TABS: 150.0000 mg | ORAL_TABLET | Freq: Every day | ORAL | 1 refills | Status: DC

## 2017-12-22 MED FILL — FLUCONAZOLE 150 MG TABLET: 150 | 1 days supply | Qty: 1 | Fill #0 | Status: TO

## 2017-12-25 ENCOUNTER — Ambulatory Visit (INDEPENDENT_AMBULATORY_CARE_PROVIDER_SITE_OTHER): Payer: No Typology Code available for payment source | Admitting: Licensed Clinical Social Worker

## 2017-12-25 DIAGNOSIS — F3181 Bipolar II disorder: Secondary | ICD-10-CM

## 2017-12-29 ENCOUNTER — Encounter (HOSPITAL_COMMUNITY): Payer: Self-pay | Admitting: Licensed Clinical Social Worker

## 2017-12-29 NOTE — Progress Notes (Signed)
   THERAPIST PROGRESS NOTE  Session Time: 10-11  Participation Level: Active  Behavioral Response: Casual, Neat and Well GroomedAlertDepressed  Type of Therapy: Individual Therapy  Treatment Goals addressed: Coping  Interventions: CBT, Strength-based, Supportive and Reframing  Summary: Victoria Barnes is a 32 y.o. female who presents with bipolar 2 currently depressed. She reports she has been in depression for past 3 days after having a week of mania that she claims was induced by some steroids she took for the flu. Pt states she is upset since she felt her doctor "strongly urged" her to take steroids even though she told him she did not want them. Pt states after receiving steroids she began a manic episode, smashing all the dishes in her house and becoming highly irritable w/ her boyfriend.   Pt and counselor discussed pt discomfort and shame associated w/ having a dx of Bipolar Disorder. She states tearfully that she does not want to be bipolar and she sometimes feels it would be better if she did wake up. When asked about SI, pt denies, stating she has no plan or intent to kill herself or to die, though she feels it is "too hard living w/ bipolar". Pt is adamant and defensive about "medications always being pushed on me by providers". Counselor states he wants to help pt find a way through bipolar w/o medications. Counselor is frank about how much work and follow through this will likely take.   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  Suicidal/Homicidal: Nowithout intent/plan  Therapist Response: Counselor continued rapport building, information gathering, and challenging pt's negative schemas patterns. Validated pt struggles and challenges through Bipolar and validated pt desires to avoid medications due  to legitimate fears (recently developed rash after starting Lamogatrine). Counselor used open questions and guided discovery to help pt gain insight into her goals for herself in 2019 and beyond.  Plan: Return again in 2 weeks.  Diagnosis:    ICD-10-CM   1. Bipolar 2 disorder [facility](HCC) F31.81        Margo CommonWesley E Swan, LCAS-A 12/29/2017

## 2018-01-08 ENCOUNTER — Encounter (HOSPITAL_COMMUNITY): Payer: Self-pay | Admitting: Licensed Clinical Social Worker

## 2018-01-08 ENCOUNTER — Other Ambulatory Visit: Payer: Self-pay | Admitting: Physician Assistant

## 2018-01-08 ENCOUNTER — Ambulatory Visit (INDEPENDENT_AMBULATORY_CARE_PROVIDER_SITE_OTHER): Payer: No Typology Code available for payment source | Admitting: Licensed Clinical Social Worker

## 2018-01-08 DIAGNOSIS — F901 Attention-deficit hyperactivity disorder, predominantly hyperactive type: Secondary | ICD-10-CM | POA: Diagnosis not present

## 2018-01-08 DIAGNOSIS — F3181 Bipolar II disorder: Secondary | ICD-10-CM | POA: Diagnosis not present

## 2018-01-08 MED ORDER — CLONAZEPAM 0.5 MG PO TABS: 0.5000 mg | ORAL_TABLET | Freq: Two times a day (BID) | ORAL | 1 refills | Status: DC | PRN

## 2018-01-08 MED FILL — clonazePAM 0.5 MG TABS: 0.5 | 15 days supply | Qty: 30 | Fill #0

## 2018-01-08 MED FILL — AMPHETAMINE SALTS 30 MG TAB: 30 | 30 days supply | Qty: 60 | Fill #0

## 2018-01-08 NOTE — Progress Notes (Signed)
   THERAPIST PROGRESS NOTE  Session Time: 11-12  Participation Level: Active  Behavioral Response: Casual and Well GroomedAlertEuthymic  Type of Therapy: Individual Therapy  Treatment Goals addressed: Diagnosis: Bipolar 2  Interventions: CBT and Supportive  Summary: Victoria Barnes is a 32 y.o. female who presents with Bipolar 2 currently states she is hypomanic. She denies any severe manic sxs currently. She has not bought more dishes for fear she may break them. She states she is "online shopping but not completing the checkout". Pt states she feels she is making progress since she is learning to live through her episodes both manic and depressed w/o as significant consequences. 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 She just had a employee review and states she gets good marks. She reports she bought the Bipolar work book discussed last session but has yet to fill it out but is looking forward to doing so.  Pt states she is learning to "embrace her depression more". She reports she wants to "learn to be sad and be ok w/ it" and "learn how to cry w/o wanting it to go away instantly".  Counselor and pt discuss pt's "differering parts" and how she is often in conflict w/ herself: wanting others to know about mental illness but "hating other people; "loving her manic episodes but hating Bipolar".  Counselor spent time asking about mother's relationship.  Suicidal/Homicidal: Nowithout intent/plan  Therapist Response: Counselor used supportive, open questions to help gain insight. Counselor used reflection, reframes,and challenging negative thinking to encourage pt. Counselor reflected that pt is making significant progress in sessions so far.  Plan: Return again in 2 weeks.  Diagnosis:    ICD-10-CM   1. Bipolar 2 disorder (HCC) F31.81   2. Attention deficit hyperactivity disorder (ADHD), predominantly hyperactive type  F90.1      Margo CommonWesley E Iwalani Templeton, LCAS-A 01/08/2018

## 2018-01-14 ENCOUNTER — Ambulatory Visit (INDEPENDENT_AMBULATORY_CARE_PROVIDER_SITE_OTHER): Payer: No Typology Code available for payment source | Admitting: Licensed Clinical Social Worker

## 2018-01-14 DIAGNOSIS — F3181 Bipolar II disorder: Secondary | ICD-10-CM | POA: Diagnosis not present
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
## 2018-01-22 ENCOUNTER — Encounter (HOSPITAL_COMMUNITY): Payer: Self-pay | Admitting: Licensed Clinical Social Worker

## 2018-01-22 ENCOUNTER — Ambulatory Visit (INDEPENDENT_AMBULATORY_CARE_PROVIDER_SITE_OTHER): Payer: No Typology Code available for payment source | Admitting: Licensed Clinical Social Worker

## 2018-01-22 DIAGNOSIS — F3181 Bipolar II disorder: Secondary | ICD-10-CM | POA: Diagnosis not present
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
## 2018-01-29 ENCOUNTER — Encounter (HOSPITAL_COMMUNITY): Payer: Self-pay | Admitting: Licensed Clinical Social Worker

## 2018-01-29 ENCOUNTER — Ambulatory Visit (INDEPENDENT_AMBULATORY_CARE_PROVIDER_SITE_OTHER): Payer: No Typology Code available for payment source | Admitting: Licensed Clinical Social Worker

## 2018-01-29 DIAGNOSIS — F3181 Bipolar II disorder: Secondary | ICD-10-CM

## 2018-01-29 DIAGNOSIS — F901 Attention-deficit hyperactivity disorder, predominantly hyperactive type: Secondary | ICD-10-CM
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
## 2018-02-11 ENCOUNTER — Other Ambulatory Visit: Payer: Self-pay

## 2018-02-11 MED ORDER — FEXOFENADINE HCL 180 MG PO TABS: 180.0000 mg | ORAL_TABLET | Freq: Every day | ORAL | 3 refills | Status: DC

## 2018-02-12 ENCOUNTER — Ambulatory Visit (INDEPENDENT_AMBULATORY_CARE_PROVIDER_SITE_OTHER): Payer: No Typology Code available for payment source | Admitting: Licensed Clinical Social Worker

## 2018-02-12 DIAGNOSIS — F3181 Bipolar II disorder: Secondary | ICD-10-CM

## 2018-02-12 MED FILL — clonazePAM 0.5 MG TABS: 0.5 | 15 days supply | Qty: 30 | Fill #1

## 2018-02-12 MED FILL — SM FEXOFENADINE 180 MG TAB: 180 | 30 days supply | Qty: 30 | Fill #0

## 2018-02-12 MED FILL — AMPHETAMINE SALTS 30 MG TAB: 30 | 30 days supply | Qty: 60 | Fill #0

## 2018-02-12 NOTE — Progress Notes (Signed)
   THERAPIST PROGRESS NOTE  Session Time: 11-12  Participation Level: Active  Behavioral Response: Neat and Well GroomedAlertEuthymic  Type of Therapy: Individual Therapy  Treatment Goals addressed: Diagnosis: Bipolar  Interventions: CBT, DBT and Supportive  Summary: Victoria Barnes is a 32 y.o. female who presents with hx of bipolar 2, currently unmanaged and w/o medications.  "I had a manic episode and came up w/ a business plan" "that manic episode was directly followed by 3 days of severe depression, thinking I don't want to be here".   Pt is active and engaged in session. She reports she continues to experience increase in positive coping ability, increased connection w/ her significant other. She reports she continues to have thoughts that "she is ok w/ death, but denies wanting to die or not wake up". Counselor spends time asking pt about her recent week, stress level, and utilization of coping skills.  Pt verbalizes that she is gaining insight into her problematic thinking styles and is using "daily gratitude lists".  Suicidal/Homicidal: Nowithout intent/plan  Therapist Response: Counselor used open questions, supportive feedback, and immediacy. Counselor spent significant time trying to understand pt's reports about her "thoughts of death". Pt shows lack of insight into her suppression of emotions.  Plan: Return again in 1 weeks.  Diagnosis:    ICD-10-CM   1. Bipolar 2 disorder [facility](HCC) F31.81      Victoria Barnes, LCAS-A 02/12/2018

## 2018-02-19 ENCOUNTER — Encounter (HOSPITAL_COMMUNITY): Payer: Self-pay | Admitting: Licensed Clinical Social Worker

## 2018-02-19 ENCOUNTER — Ambulatory Visit (HOSPITAL_COMMUNITY): Payer: Self-pay | Admitting: Licensed Clinical Social Worker

## 2018-02-26 ENCOUNTER — Ambulatory Visit (INDEPENDENT_AMBULATORY_CARE_PROVIDER_SITE_OTHER): Payer: No Typology Code available for payment source | Admitting: Licensed Clinical Social Worker

## 2018-02-26 DIAGNOSIS — F3181 Bipolar II disorder: Secondary | ICD-10-CM

## 2018-03-05 ENCOUNTER — Encounter (HOSPITAL_COMMUNITY): Payer: Self-pay | Admitting: Psychiatry

## 2018-03-05 ENCOUNTER — Ambulatory Visit (HOSPITAL_COMMUNITY): Payer: No Typology Code available for payment source | Admitting: Psychiatry

## 2018-03-05 DIAGNOSIS — Z79899 Other long term (current) drug therapy: Secondary | ICD-10-CM | POA: Diagnosis not present

## 2018-03-05 DIAGNOSIS — F901 Attention-deficit hyperactivity disorder, predominantly hyperactive type: Secondary | ICD-10-CM

## 2018-03-05 DIAGNOSIS — F4325 Adjustment disorder with mixed disturbance of emotions and conduct: Secondary | ICD-10-CM | POA: Diagnosis not present

## 2018-03-05 DIAGNOSIS — Z87891 Personal history of nicotine dependence: Secondary | ICD-10-CM

## 2018-03-05 DIAGNOSIS — Z818 Family history of other mental and behavioral disorders: Secondary | ICD-10-CM

## 2018-03-05 MED ORDER — AMPHETAMINE-DEXTROAMPHETAMINE 30 MG PO TABS: 30.0000 mg | ORAL_TABLET | Freq: Two times a day (BID) | ORAL | 0 refills | Status: DC

## 2018-03-05 NOTE — Progress Notes (Signed)
BH MD/PA/NP OP Progress Note  03/05/2018 9:55 AM Victoria Barnes  MRN:  [id number]  Chief Complaint: med management  HPI: Victoria Barnes reports with overall stable mood and anxiety.  She has been working on self-care and behavioral strategies with Victoria Barnes and feels that this is incredibly valuable for her.  I agreed with her that this will be far more valuable for her in the long run and agreed to minimize the use of medications.  She is only taking Adderall 30 mg periodically a few times a week to help with attention and focus but otherwise does not use the Seroquel or clonazepam.  Discussed that this writer is transitioning out of clinic to a private practice.  Patient and I agreed that her primary care provider can assume management of her adderall refills and patient is welcome to return back to behavioral health clinic if she wishes or has any changes in her mental health needs.  She will continue in individual therapy with Victoria Barnes as well.  Visit Diagnosis:    ICD-10-CM   1. Adjustment disorder with mixed disturbance of emotions and conduct F43.25   2. Attention deficit hyperactivity disorder (ADHD), predominantly hyperactive type F90.1 amphetamine-dextroamphetamine (ADDERALL) 30 MG tablet    Past Psychiatric History: See intake H&P for full details. Reviewed, with no updates at this time.  Past Medical History:  Past Medical History:  Diagnosis Date  . ADHD   . Bipolar I disorder (HCC)   . Depression   . Eczema 2012  . Migraine without status migrainosus, not intractable 02/26/2016    Past Surgical History:  Procedure Laterality Date  . BREAST ENHANCEMENT SURGERY Bilateral June 2015  . WISDOM TOOTH EXTRACTION      Family Psychiatric History: See intake H&P for full details. Reviewed, with no updates at this time.   Family History:  Family History  Problem Relation Age of Onset  . Hyperlipidemia Mother   . Personality disorder Maternal Grandmother   . Depression Maternal  Grandmother   . Mood Disorder Maternal Grandmother     Social History:  Social History   Socioeconomic History  . Marital status: Single    Spouse name: Not on file  . Number of children: Not on file  . Years of education: Not on file  . Highest education level: Not on file  Occupational History  . Occupation: CMA  Social Needs  . Financial resource strain: Not hard at all  . Food insecurity:    Worry: Never true    Inability: Never true  . Transportation needs:    Medical: No    Non-medical: No  Tobacco Use  . Smoking status: Former Smoker    Types: Cigarettes    Last attempt to quit: 11/2014    Years since quitting: 3.3  . Smokeless tobacco: Never Used  . Tobacco comment: quit 2 years  Substance and Sexual Activity  . Alcohol use: Yes    Comment: once or twice a month - socially  . Drug use: No  . Sexual activity: Yes    Partners: Male    Birth control/protection: IUD    Comment: condoms  Lifestyle  . Physical activity:    Days per week: 2 days    Minutes per session: 50 min  . Stress: To some extent  Relationships  . Social connections:    Talks on phone: Never    Gets together: Never    Attends religious service: More than 4 times per year  Active member of club or organization: No    Attends meetings of clubs or organizations: Never    Relationship status: Never married  Other Topics Concern  . Not on file  Social History Narrative  . Not on file    Allergies:  Allergies  Allergen Reactions  . Lamictal [Lamotrigine] Rash    Possible SJS  . Antacid [Alum & Mag Hydroxide-Simeth] Nausea And Vomiting  . Keflex [Cephalexin] Nausea And Vomiting  . Penicillins Nausea And Vomiting  . Trintellix [Vortioxetine]     Swelling/numbness and tingling of extremities.   Elliot Cousin Hcl]     Abnormal Frightening Dreams    Metabolic Disorder Labs: No results found for: HGBA1C, MPG No results found for: PROLACTIN Lab Results  Component Value  Date   CHOL 177 09/07/2017   TRIG 38 09/07/2017   HDL 71 09/07/2017   CHOLHDL 2.5 09/07/2017   VLDL 6 09/08/2016   LDLCALC 95 09/07/2017   LDLCALC 111 (H) 09/08/2016   Lab Results  Component Value Date   TSH 1.62 09/07/2017   TSH 2.06 02/23/2017    Therapeutic Level Labs: No results found for: LITHIUM No results found for: VALPROATE No components found for:  CBMZ  Current Medications: Current Outpatient Medications  Medication Sig Dispense Refill  . amphetamine-dextroamphetamine (ADDERALL) 30 MG tablet Take 1 tablet by mouth 2 (two) times daily. 60 tablet 0  . azithromycin (ZITHROMAX) 250 MG tablet Take 2 tablets now and then one tablet for 4 days. 6 tablet 0  . Clobetasol Prop Emollient Base (CLOBETASOL PROPIONATE E) 0.05 % emollient cream Use twice a day only as needed for eczema. 30 g 6  . fexofenadine (ALLEGRA ALLERGY) 180 MG tablet Take 1 tablet (180 mg total) by mouth daily. 90 tablet 3  . fluconazole (DIFLUCAN) 150 MG tablet Take 1 tablet (150 mg total) by mouth daily. 1 tablet 1  . fluticasone (FLONASE) 50 MCG/ACT nasal spray Place 1 spray into both nostrils daily. 15.8 g 2  . Ginkgo Biloba 40 MG TABS Take by mouth.    [name] HYDROcodone-homatropine (HYCODAN) 5-1.5 MG/5ML syrup Take 5 mLs by mouth at bedtime as needed for cough. 120 mL 0  . Iron-FA-B Cmp-C-Biot-Probiotic (FUSION PLUS) CAPS Take 1 tablet by mouth daily. 30 capsule 11  . Levonorgestrel (SKYLA) 13.5 MG IUD by Intrauterine route.    . linaclotide (LINZESS) 290 MCG CAPS capsule Take 1 capsule (290 mcg total) by mouth daily. 30 capsule 3  . metroNIDAZOLE (FLAGYL) 500 MG tablet Take 1 tablet (500 mg total) by mouth 2 (two) times daily. 14 tablet 0  . PREVIDENT 5000 BOOSTER PLUS 1.1 % PSTE   12  . rizatriptan (MAXALT-MLT) 10 MG disintegrating tablet Take 1 tablet (10 mg total) by mouth as needed for migraine. May repeat in 2 hours if needed 10 tablet 3  . tacrolimus (PROTOPIC) 0.1 % ointment Apply topically 2 (two)  times daily. Only as needed to eczema spots. 100 g 0  . valACYclovir (VALTREX) 1000 MG tablet TAKE 2 TABLETS TWICE A DAY FOR ONE DAY FOR OUTBREAK. 8 tablet 0   No current facility-administered medications for this visit.      Musculoskeletal: Strength & Muscle Tone: within normal limits Gait & Station: normal Patient leans: N/A  Psychiatric Specialty Exam: ROS  There were no vitals taken for this visit.There is no height or weight on file to calculate BMI.  General Appearance: Casual and Well Groomed  Eye Contact:  Good  Speech:  Clear and Coherent and Normal Rate  Volume:  Normal  Mood:  Euthymic  Affect:  Appropriate and Congruent  Thought Process:  Coherent and Descriptions of Associations: Intact  Orientation:  Full (Time, Place, and Person)  Thought Content: Logical   Suicidal Thoughts:  No  Homicidal Thoughts:  No  Memory:  Immediate;   Good  Judgement:  Good  Insight:  Good  Psychomotor Activity:  Normal  Concentration:  Concentration: Good  Recall:  Good  Fund of Knowledge: Good  Language: Good  Akathisia:  Negative  Handed:  Right  AIMS (if indicated): not done  Assets:  Communication Skills Desire for Improvement Financial Resources/Insurance Housing Intimacy  ADL's:  Intact  Cognition: WNL  Sleep:  Good   Screenings: GAD-7     Counselor from 07/08/2016 in BEHAVIORAL HEALTH PARTIAL HOSPITALIZATION PROGRAM Office Visit from 06/17/2016 in [facility] [facility] [place]  Total GAD-7 Score  6  18    PHQ2-9     Office Visit from 07/14/2017 in [place] [facility] [place] Counselor from 07/08/2016 in BEHAVIORAL HEALTH PARTIAL HOSPITALIZATION PROGRAM Office Visit from 06/17/2016 in [place] [facility] [place] Office Visit from 09/07/2014 in [facility]  PHQ-2 Total Score  PHQ-9 Total Score  Assessment and Plan: RABAB CURRINGTON presents for  medication management follow-up.  She continues to take Adderall generally about 3-4 days/week, and takes weekend holidays.  She is sleeping well, and participating in individual therapy.  During periods of increased irritability and anxiety, she does engage in some risky behaviors, though my suspicion of bipolar disorder has gradually decreased over time.  I believe she presents more consistently with ADHD and periodic disturbance of her behavior in the context of stressors.  No acute safety issues or substance abuse.  She is appropriate for routine medication checks for Adderall with primary care and will continue in individual therapy at this office.  1. Adjustment disorder with mixed disturbance of emotions and conduct   2. Attention deficit hyperactivity disorder (ADHD), predominantly hyperactive type     Status of current problems: stable  Labs Ordered: No orders of the defined types were placed in this encounter.   Labs Reviewed: n/a  Collateral Obtained/Records Reviewed: n/a  Plan:  Adderall 30 mg daily, may take up to twice daily Follow-up with primary care  I spent 15 minutes with the patient in direct face-to-face clinical care.  Greater than 50% of this time was spent in counseling and coordination of care with the patient.    Burnard Leigh, MD 03/05/2018, 9:55 AM

## 2018-03-12 ENCOUNTER — Ambulatory Visit (INDEPENDENT_AMBULATORY_CARE_PROVIDER_SITE_OTHER): Payer: No Typology Code available for payment source | Admitting: Physician Assistant

## 2018-03-12 ENCOUNTER — Encounter: Payer: Self-pay | Admitting: Physician Assistant

## 2018-03-12 VITALS — BP 105/62 | HR 79 | Temp 99.0°F | Ht 64.0 in | Wt 113.5 lb

## 2018-03-12 DIAGNOSIS — F3181 Bipolar II disorder: Secondary | ICD-10-CM

## 2018-03-12 DIAGNOSIS — J302 Other seasonal allergic rhinitis: Secondary | ICD-10-CM | POA: Diagnosis not present

## 2018-03-12 DIAGNOSIS — F909 Attention-deficit hyperactivity disorder, unspecified type: Secondary | ICD-10-CM | POA: Diagnosis not present

## 2018-03-12 DIAGNOSIS — Z30432 Encounter for removal of intrauterine contraceptive device: Secondary | ICD-10-CM | POA: Diagnosis not present

## 2018-03-12 MED ORDER — LEVOCETIRIZINE DIHYDROCHLORIDE 5 MG PO TABS: 5.0000 mg | ORAL_TABLET | Freq: Every evening | ORAL | 4 refills | Status: DC

## 2018-03-12 NOTE — Progress Notes (Signed)
   Subjective:    Patient ID: Victoria Barnes, female    DOB: 1986/09/13, 32 y.o.   MRN: [id number]  HPI  Pt is a 32 yo female with migraines, ADHD, Bipolar who presents to the clinic to have skyla removed. She has had for almost 3 years but feels it could be causing body acne.   She ask we take over medications for ADHD. Currently she is not on any medications for bipolar. She does go to counseling once a week.   .. Active Ambulatory Problems    Diagnosis Date Noted  . Eczema 07/17/2015  . 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 Attention deficit hyperactivity disorder (ADHD) 01/08/2016  . Migraine without status migrainosus, not intractable 02/26/2016  . Insomnia 05/22/2016  . 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 Paresthesias 09/10/2016  . Bipolar 2 disorder (HCC) 07/07/2017  . Dehydration 12/16/2017  . Seasonal allergies 03/14/2018   Resolved Ambulatory Problems    Diagnosis Date Noted  . Vitamin D deficiency 06/20/2015  . Depression 07/17/2015  . Abnormal weight gain 07/19/2015  . Pain in joint, ankle and foot 08/02/2015  . Exposure to strep throat 09/07/2015  . Viral gastroenteritis 09/13/2015  . Abscess of skin 09/13/2015  . Pharyngitis 11/06/2015  . Migraine without aura and with status migrainosus, not intractable 05/22/2016  . Nausea without vomiting 05/24/2016  . 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 Dehydration 08/06/2016  . Episodic mood disorder (HCC) 11/11/2016  . Influenza-like illness 12/11/2016  . Bipolar 1 disorder (HCC) 02/23/2017  . No energy 02/23/2017  . Side effects of treatment 02/23/2017   Past Medical History:  Diagnosis Date  . ADHD   . Bipolar I disorder (HCC)   . Depression   . Eczema 2012  . Migraine without status migrainosus, not intractable 02/26/2016      Review of Systems  All other systems reviewed and are negative.      Objective:    Physical Exam  Constitutional: She is oriented to person, place, and time. She appears well-developed and well-nourished.  Cardiovascular: Normal rate and regular rhythm.  Pulmonary/Chest: Effort normal and breath sounds normal.  Genitourinary: Vagina normal and uterus normal. No vaginal discharge found.  Genitourinary Comments: Cervix without polyps or tenderness or masses. IUD strings detected.   Neurological: She is alert and oriented to person, place, and time.  Psychiatric: She has a normal mood and affect. Her behavior is normal.    IUD removed with long head forceps without complications.       Assessment & Plan:  Marland KitchenMarland KitchenMarny was seen today for iud removal.  Diagnoses and all orders for this visit:  Encounter for IUD removal  Attention deficit hyperactivity disorder (ADHD), unspecified ADHD type  Bipolar 2 disorder (HCC)  Seasonal allergies -     levocetirizine (XYZAL) 5 MG tablet; Take 1 tablet (5 mg total) by mouth every evening.   IUD removed today. Discussed pregnancy prevention. Pt wishes to not have any.   Will take over medications for ADHD.   xzyal to try.

## 2018-03-14 ENCOUNTER — Encounter: Payer: Self-pay | Admitting: Physician Assistant

## 2018-03-14 DIAGNOSIS — J302 Other seasonal allergic rhinitis: Secondary | ICD-10-CM | POA: Insufficient documentation

## 2018-03-24 ENCOUNTER — Other Ambulatory Visit: Payer: Self-pay | Admitting: Physician Assistant

## 2018-04-02 ENCOUNTER — Ambulatory Visit (INDEPENDENT_AMBULATORY_CARE_PROVIDER_SITE_OTHER): Payer: No Typology Code available for payment source | Admitting: Licensed Clinical Social Worker

## 2018-04-02 DIAGNOSIS — F4325 Adjustment disorder with mixed disturbance of emotions and conduct: Secondary | ICD-10-CM

## 2018-04-02 MED FILL — AMPHETAMINE SALTS 30 MG TAB: 30 | 30 days supply | Qty: 60 | Fill #0

## 2018-04-08 ENCOUNTER — Encounter (HOSPITAL_COMMUNITY): Payer: Self-pay | Admitting: Licensed Clinical Social Worker

## 2018-04-08 NOTE — Progress Notes (Signed)
   THERAPIST PROGRESS NOTE  Session Time: 11-12  Participation Level: Active  Behavioral Response: Neat and Well GroomedAlertEuthymic  Type of Therapy: Individual Therapy  Treatment Goals addressed: Coping  Interventions: CBT and Supportive  Summary: Victoria Barnes is a 32 y.o. female who presents with hx of mood disturbance and unresolved grief from her father's murder when she was 32yo. Pt is clean, alert, engaged, and calm throughout session. She reports she removed her birthcontrol implant device and w/i hours felt "much better" emotionally. She now believes that her birth control was the primary cause of her Bipolar since she "never had those sxs before implant and no longer has them in the past month w/o her birthcontrol". I asked her what form of birthcontrol she is now on and pt stated "none, I'm just hoping I don't get pregnant". I discussed the riskiness of this bx but pt was not interested in discussing the topic. 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 Pt and I looked at pt's website which is professional, well designed, and coherent. I reflected that pt appears to have worked hard and w/ focus on this project and pt stated she is "committed to getting this business going asap". I spent time encouraging pt to continue her routine since it appears to be helping.  I asked about pt's unresolved grief w/ her father's murder. Pt states she still feels she has a problem w/ it since she cannot bring herself to buy him a headstone for his grave. She admits she does not know his exact burial site but brings her son to graveyard and guesses which site is her father's remains. Pt states she wants to work on getting him a Engineer, agriculturalheadstone and contacting the [employer]vey[employer] administrators to find out which plot is her father.  Suicidal/Homicidal: Nowithout intent/plan  Therapist Response: I used open questions,  honest and frank feedback about lack of birthcontrol, pointed out risky bx, inquired about pt's desire for further counseling on unresolved grief, encouraged pt on her achievements w/ her website.   Plan: Return again in 4 weeks.  Diagnosis:    ICD-10-CM   1. 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

## 2018-04-15 ENCOUNTER — Ambulatory Visit (HOSPITAL_COMMUNITY): Payer: Self-pay | Admitting: Licensed Clinical Social Worker

## 2018-04-21 ENCOUNTER — Other Ambulatory Visit: Payer: Self-pay

## 2018-04-21 DIAGNOSIS — F901 Attention-deficit hyperactivity disorder, predominantly hyperactive type: Secondary | ICD-10-CM

## 2018-04-21 MED ORDER — AMPHETAMINE-DEXTROAMPHETAMINE 30 MG PO TABS: 30.0000 mg | ORAL_TABLET | Freq: Two times a day (BID) | ORAL | 0 refills | Status: DC

## 2018-04-21 MED ORDER — FLUTICASONE PROPIONATE 50 MCG/ACT NA SUSP: 1.0000 | Freq: Every day | NASAL | 2 refills | Status: DC

## 2018-04-21 MED ORDER — VALACYCLOVIR HCL 1 G PO TABS: ORAL_TABLET | ORAL | 0 refills | Status: DC

## 2018-04-21 MED FILL — valACYclovir HCL 1 GM TABS: 1 | 15 days supply | Qty: 30 | Fill #0

## 2018-04-23 ENCOUNTER — Telehealth: Payer: Self-pay

## 2018-04-23 MED ORDER — FLUCONAZOLE 150 MG PO TABS: 150.0000 mg | ORAL_TABLET | Freq: Once | ORAL | 0 refills | Status: AC

## 2018-04-23 MED FILL — FLUCONAZOLE 150 MG TABS: 150 | 1 days supply | Qty: 1 | Fill #0

## 2018-04-23 NOTE — Telephone Encounter (Signed)
Pt is requesting fluconazole. Please advise. -EH/RMA

## 2018-04-23 NOTE — Telephone Encounter (Signed)
Sent diflucan

## 2018-04-30 ENCOUNTER — Ambulatory Visit (INDEPENDENT_AMBULATORY_CARE_PROVIDER_SITE_OTHER): Payer: No Typology Code available for payment source | Admitting: Licensed Clinical Social Worker

## 2018-04-30 DIAGNOSIS — F901 Attention-deficit hyperactivity disorder, predominantly hyperactive type: Secondary | ICD-10-CM | POA: Diagnosis not present

## 2018-04-30 DIAGNOSIS — F3181 Bipolar II disorder: Secondary | ICD-10-CM

## 2018-05-04 ENCOUNTER — Encounter (HOSPITAL_COMMUNITY): Payer: Self-pay | Admitting: Licensed Clinical Social Worker
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
## 2018-05-14 ENCOUNTER — Ambulatory Visit (INDEPENDENT_AMBULATORY_CARE_PROVIDER_SITE_OTHER): Payer: No Typology Code available for payment source | Admitting: Licensed Clinical Social Worker

## 2018-05-14 DIAGNOSIS — F901 Attention-deficit hyperactivity disorder, predominantly hyperactive type: Secondary | ICD-10-CM

## 2018-05-14 DIAGNOSIS — F3181 Bipolar II disorder: Secondary | ICD-10-CM

## 2018-05-18 ENCOUNTER — Encounter (HOSPITAL_COMMUNITY): Payer: Self-pay | Admitting: Licensed Clinical Social Worker
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
## 2018-05-25 MED FILL — AMPHETAMINE SALTS 30 MG TAB: 30 | 30 days supply | Qty: 60 | Fill #0

## 2018-05-31 ENCOUNTER — Encounter: Payer: Self-pay | Admitting: Physician Assistant

## 2018-05-31 ENCOUNTER — Ambulatory Visit (INDEPENDENT_AMBULATORY_CARE_PROVIDER_SITE_OTHER): Payer: No Typology Code available for payment source | Admitting: Physician Assistant

## 2018-05-31 VITALS — BP 113/71 | HR 80 | Wt 112.0 lb

## 2018-05-31 DIAGNOSIS — F39 Unspecified mood [affective] disorder: Secondary | ICD-10-CM | POA: Diagnosis not present

## 2018-05-31 NOTE — Progress Notes (Signed)
   Subjective:    Patient ID: Victoria Barnes, female    DOB: 1986/05/27, 32 y.o.   MRN: [id number]  HPI   Pt is a [age] yr female w/ a hx of Bipolar, ADHD presenting to the clinic for evaluation of Bipolar disorder. Pt states she has been "spiraling" for the past month. She reports having manic episodes for 1 weeks where she spends money and goes days without sleep with most recent being 2 weeks ago. She reports depressive symptoms soon after her manic like episodes subside.  Pt states she feels she needs to step away from work to focus on her health. Pt feels she is unable to perform well at work and is requesting to have leave for 2 weeks. Pt has been on several mood stabilizing medications with the most recent being Lamictal which she says was helpful but reports having a SJS rash with it. Pt reports not being on any psych medicine since November. She has been seeing a counselor several times a week which she has found therapeutic. Pt also mentions doing coping techniques with her counselor. Pt is open to being on medicine for her mood but has not found one that has been beneficial. Pt denies SI/HI/AVH. She feels safe in her environment and home at this time.   Psychiatrist: she last saw them in November not currently seeing anybody.    Review of Systems  Constitutional: Negative.  Negative for chills, fatigue and fever.  HENT: Negative.   Eyes: Negative.   Respiratory: Negative for cough, shortness of breath and wheezing.   Cardiovascular: Negative for chest pain and palpitations.  Gastrointestinal: Negative.   Genitourinary: Negative.   Musculoskeletal: Negative.   Psychiatric/Behavioral: Positive for dysphoric mood. Negative for hallucinations and suicidal ideas.       Objective:   Physical Exam  Constitutional: She appears well-developed and well-nourished.  HENT:  Head: Normocephalic and atraumatic.  Eyes: Pupils are equal, round, and reactive to light. Conjunctivae and EOM are  normal.  Neck: Normal range of motion.  Cardiovascular: Normal rate, regular rhythm and normal heart sounds.  Pulmonary/Chest: Effort normal and breath sounds normal.  Neurological: She is alert.  Skin: Skin is warm and dry.  Psychiatric: Her speech is normal and behavior is normal. Judgment and thought content normal. Her affect is blunt. She is not actively hallucinating. Cognition and memory are normal. She does not express impulsivity. She exhibits a depressed mood. She expresses no suicidal ideation. She expresses no suicidal plans.       Assessment & Plan:  .Diagnoses and all orders for this visit:  Mood disorder [facility](HCC) -     Ambulatory referral to Psychiatry  -Pt referred to Psychiatry for Bipolar management. Pt has failed several mood stabilizers. Denies SI/HI/AVH at this time. No acute safety issues. Verbally contracted for safety.  - work note provided for 2 weeks of leave, patient to drop off FMLA paperwork.   -Pt encouraged to continue seeing counselor and do coping techniques. Pt encourage to follow up as needed.

## 2018-05-31 NOTE — Patient Instructions (Signed)
Other resources: - [place].ateverydayhealth.com/depression/guide/resources/ - [employer]cup[employer]a - https://malone.com/greatist.com/grow/resources-when-you-can-not-afford-therapy - online counseling: BetterHelp and [employer] (not covered by insurance, but affordable self-pay rates)  Safety Plan: if having self-harm or suicidal thoughts Our Office [phone] [facility] Crisis Hotline 2266377219505 402 4808 National Suicide Hotline 1-800-SUICIDE If in immediate danger of harming yourself, go to the nearest emergency room or call 911

## 2018-06-01 ENCOUNTER — Ambulatory Visit: Payer: Self-pay | Admitting: Physician Assistant

## 2018-06-04 ENCOUNTER — Ambulatory Visit (INDEPENDENT_AMBULATORY_CARE_PROVIDER_SITE_OTHER): Payer: No Typology Code available for payment source | Admitting: Licensed Clinical Social Worker

## 2018-06-04 DIAGNOSIS — F3131 Bipolar disorder, current episode depressed, mild: Secondary | ICD-10-CM | POA: Diagnosis not present

## 2018-06-07 ENCOUNTER — Ambulatory Visit: Payer: Self-pay | Admitting: Physician Assistant

## 2018-06-07 ENCOUNTER — Encounter (HOSPITAL_COMMUNITY): Payer: Self-pay | Admitting: Licensed Clinical Social Worker

## 2018-06-07 NOTE — Progress Notes (Signed)
   THERAPIST PROGRESS NOTE  Session Time: 11-12  Participation Level: Active  Behavioral Response: Casual and Well GroomedAlertDepressed and Dysphoric  Type of Therapy: Individual Therapy  Treatment Goals addressed: Diagnosis: Bipolar   Interventions: CBT, DBT and Strength-based  Summary: Victoria Barnes is a 32 y.o. female who presents with hx of Bipolar current in a post-manic state of depression. She reports that during her last week she has been down, depressed, and hopeless about her ability to change. She is flat in session and admits that she has called out of work for short-term disability since she "doesn't feel like she can go into work" in her current state. Counselor and pt discuss pt's inability to tolerate distress and go over a handout from DBT on distress tolerance techniques. Pt denies SI or HI though admits she "would be fine if she didn't wake up". Pt gives reasons for why she cannot take her own life including her family and her son.   Pt reports that prior to her current depressed state she was seeing her "on-the-side" female, sexual partner near daily for 2 weeks. She has decided to take a break from her boyfriend (who appears supportive and cares for her and her son). She reports excessive masturbation and feeling "keyed up" recently. Pt states she "wishes she could be more grateful but she is stuck in her depression and hopelessness".  Suicidal/Homicidal: Nowithout intent/plan  Therapist Response: Counselor used open questions, active listening, DBT sills training in growing distress tolerance. Counselor encouraged pt to consider practicing gratitude and help pt identify places to volunteer her time during her temporary time off from work.  Plan: Return again in 2 weeks.  Diagnosis:    ICD-10-CM   1. Bipolar affective disorder, currently depressed, mild (HCC) F31.31       Victoria Barnes, LCAS-A 06/07/2018

## 2018-06-09 ENCOUNTER — Telehealth: Payer: Self-pay | Admitting: Physician Assistant

## 2018-06-09 NOTE — Telephone Encounter (Signed)
FMLA forms completed for continuous leave 05/31/18-06/27/18

## 2018-06-09 NOTE — Telephone Encounter (Signed)
Pt notified -EH/RMA  

## 2018-06-15 ENCOUNTER — Telehealth: Payer: Self-pay | Admitting: Physician Assistant

## 2018-06-15 NOTE — Telephone Encounter (Signed)
Form completed for [employer]ability Please print notes from last office visit and attach with claim number [id number]  Let patient know that I filled out both the Behavioral Health Clinician Statement and the Attending Provider Statement to the best of my ability. I cannot promise that [facility] will accept the Behavioral Health statement, since that is not my specialty.  I also noticed that the referral note states she is established with Dr. Rene KocherEksir, however, Dr[facility]. I am routing to our referral coordinator to make sure patient is [facility] [place]

## 2018-06-16 ENCOUNTER — Ambulatory Visit (HOSPITAL_COMMUNITY): Payer: Self-pay | Admitting: Licensed Clinical Social Worker

## 2018-06-16 NOTE — Telephone Encounter (Signed)
I called Dot [facility]Krista [facility] and she is [employer] [place] to make sure patient is set up with new Psychiatrist. - CF

## 2018-06-16 NOTE — Telephone Encounter (Signed)
Forms faxed and placed in patient information drawer.  Pt notified -EH/RMA

## 2018-06-23 ENCOUNTER — Ambulatory Visit (HOSPITAL_COMMUNITY): Payer: No Typology Code available for payment source | Admitting: Licensed Clinical Social Worker

## 2018-06-23 DIAGNOSIS — F3131 Bipolar disorder, current episode depressed, mild: Secondary | ICD-10-CM

## 2018-06-25 ENCOUNTER — Encounter (HOSPITAL_COMMUNITY): Payer: Self-pay | Admitting: Licensed Clinical Social Worker
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
## 2018-07-06 ENCOUNTER — Ambulatory Visit (HOSPITAL_COMMUNITY): Payer: Self-pay | Admitting: Licensed Clinical Social Worker

## 2018-07-09 ENCOUNTER — Encounter (HOSPITAL_COMMUNITY): Payer: Self-pay | Admitting: Licensed Clinical Social Worker

## 2018-07-09 NOTE — Progress Notes (Signed)
   THERAPIST PROGRESS NOTE  Session Time: 11-12  Participation Level: Active  Behavioral Response: Neat and Well GroomedAlertEuthymic  Type of Therapy: Individual Therapy  Treatment Goals addressed: Diagnosis: Bipolar  Interventions: CBT, DBT and Supportive  Summary: MARELLA SYMS is a 32 y.o. female who presents with hx of bipolar 2, currently unmanaged and w/o medications.  "I had a manic episode and came up w/ a business plan" "that manic episode was directly followed by 3 days of severe depression, thinking I don't want to be here".   Pt is active and engaged in session. She reports she continues to experience increase in positive coping ability, increased connection w/ her significant other. She reports she continues to have thoughts that "she is ok w/ death, but denies wanting to die or not wake up". Counselor spends time asking pt about her recent week, stress level, and utilization of coping skills.  Pt verbalizes that she is gaining insight into her problematic thinking styles and is using "daily gratitude lists".  Suicidal/Homicidal: Nowithout intent/plan  Therapist Response: Counselor used open questions, supportive feedback, and immediacy. Counselor spent significant time trying to understand pt's reports about her "thoughts of death". Pt shows lack of insight into her suppression of emotions.  Plan: Return again in 1 weeks.  Diagnosis:    ICD-10-CM   1. Bipolar 2 disorder [facility]) F31.81      Margo Common, LCAS-A 02/26/18

## 2018-07-12 ENCOUNTER — Other Ambulatory Visit: Payer: Self-pay

## 2018-07-12 DIAGNOSIS — F901 Attention-deficit hyperactivity disorder, predominantly hyperactive type: Secondary | ICD-10-CM

## 2018-07-12 MED ORDER — AMPHETAMINE-DEXTROAMPHETAMINE 30 MG PO TABS
30.0000 mg | ORAL_TABLET | Freq: Two times a day (BID) | ORAL | 0 refills | Status: DC
Start: 2018-07-12 — End: 2018-08-31

## 2018-07-12 NOTE — Progress Notes (Signed)
Done

## 2018-07-22 MED FILL — DEXTROAMPH TB 30MG NSTR 100: 30 | 30 days supply | Qty: 60 | Fill #0

## 2018-07-23 ENCOUNTER — Ambulatory Visit (HOSPITAL_COMMUNITY): Payer: No Typology Code available for payment source | Admitting: Licensed Clinical Social Worker

## 2018-07-23 ENCOUNTER — Encounter (HOSPITAL_COMMUNITY): Payer: Self-pay | Admitting: Licensed Clinical Social Worker

## 2018-07-23 DIAGNOSIS — F3131 Bipolar disorder, current episode depressed, mild: Secondary | ICD-10-CM

## 2018-07-23 NOTE — Progress Notes (Signed)
   THERAPIST PROGRESS NOTE  Session Time: 9-10  Participation Level: Active  Behavioral Response: Neat and Well GroomedAlertDysphoric  Type of Therapy: Individual Therapy  Treatment Goals addressed: Anxiety  Interventions: CBT  Summary: Victoria Barnes is a 32 y.o. female who presents with anxiety related to feelings of being unrealiable at work.  "I'm struggling to network and grow my career prospects bc I fear that people think I am unreliable."  Pt is active, engaged, speaks lucidly w/ somewhat pressured speech, and agitated body movements. She reports she is stressed since her coworker is spreading lies about her performance. Counselor spends majority of session helping pt ask socractic questions based on "The Work" of Jules HusbandsByron Katie, a CBT handout. Counselor provides copy of handout and encourages pt to continue to assess her troubling beliefs that cause stress. Assessed the attachment to the thought "I am unreliable at work".     Suicidal/Homicidal: Nowithout intent/plan  Therapist Response: Counselor used open questions, reflection, and CBT based socractic questioning utilizing a handout from Owens-IllinoisByron Katie, a CBT specialist. Pt reports she began session feeling tense and pressure in her chest and ended session feeling gratitude and relief.   Plan: Return again in 2 weeks.  Diagnosis:    ICD-10-CM   1. Bipolar affective disorder, currently depressed, mild (HCC) F31.31        Margo CommonWesley E Swan, LCAS-A 07/23/2018

## 2018-08-06 ENCOUNTER — Ambulatory Visit (HOSPITAL_COMMUNITY): Payer: Self-pay | Admitting: Licensed Clinical Social Worker

## 2018-08-13 ENCOUNTER — Ambulatory Visit (HOSPITAL_COMMUNITY): Payer: Self-pay | Admitting: Licensed Clinical Social Worker

## 2018-08-13 ENCOUNTER — Encounter

## 2018-08-16 ENCOUNTER — Ambulatory Visit (INDEPENDENT_AMBULATORY_CARE_PROVIDER_SITE_OTHER): Payer: No Typology Code available for payment source | Admitting: Family Medicine

## 2018-08-16 DIAGNOSIS — Z23 Encounter for immunization: Secondary | ICD-10-CM | POA: Diagnosis not present

## 2018-08-17 NOTE — Progress Notes (Signed)
Flu vaccine given.  Orders Placed This Encounter  Procedures  . Flu Vaccine QUAD 36+ mos IM

## 2018-08-20 ENCOUNTER — Ambulatory Visit (HOSPITAL_COMMUNITY): Payer: Self-pay | Admitting: Licensed Clinical Social Worker

## 2018-08-27 ENCOUNTER — Ambulatory Visit (HOSPITAL_COMMUNITY): Payer: No Typology Code available for payment source | Admitting: Licensed Clinical Social Worker

## 2018-08-31 ENCOUNTER — Ambulatory Visit (INDEPENDENT_AMBULATORY_CARE_PROVIDER_SITE_OTHER): Payer: No Typology Code available for payment source | Admitting: Physician Assistant

## 2018-08-31 ENCOUNTER — Encounter: Payer: Self-pay | Admitting: Physician Assistant

## 2018-08-31 VITALS — BP 128/71 | HR 89 | Temp 98.9°F | Ht 59.0 in | Wt 115.5 lb

## 2018-08-31 DIAGNOSIS — F901 Attention-deficit hyperactivity disorder, predominantly hyperactive type: Secondary | ICD-10-CM

## 2018-08-31 DIAGNOSIS — F319 Bipolar disorder, unspecified: Secondary | ICD-10-CM | POA: Diagnosis not present

## 2018-08-31 DIAGNOSIS — F419 Anxiety disorder, unspecified: Secondary | ICD-10-CM

## 2018-08-31 MED ORDER — AMPHETAMINE-DEXTROAMPHETAMINE 30 MG PO TABS: 30.0000 mg | ORAL_TABLET | Freq: Two times a day (BID) | ORAL | 0 refills | Status: DC

## 2018-08-31 MED ORDER — CLONAZEPAM 0.5 MG PO TABS: 0.5000 mg | ORAL_TABLET | Freq: Two times a day (BID) | ORAL | 3 refills | Status: DC | PRN

## 2018-08-31 MED FILL — clonazePAM 0.5 MG TABS: 0.5 | 10 days supply | Qty: 20 | Fill #0

## 2018-08-31 MED FILL — DEXTROAMPH TB 30MG NSTR 100: 30 | 30 days supply | Qty: 60 | Fill #0

## 2018-09-01 ENCOUNTER — Encounter: Payer: Self-pay | Admitting: Physician Assistant

## 2018-09-01 DIAGNOSIS — F419 Anxiety disorder, unspecified: Secondary | ICD-10-CM | POA: Insufficient documentation

## 2018-09-01 NOTE — Progress Notes (Signed)
   Subjective:    Patient ID: Victoria Barnes, female    DOB: [birth year], 32 y.o.   MRN: [id number]  HPI Pt is a 32 yo female who presents to the clinic for 3 month follow up on ADD. Pt also needs paperwork FMLA filled out for intermittent leave for bipolar.   Overall patient is doing well. She has no complaints. She admits her mood is up and down but she does not want medication treatment. She does go to counseling every 2 weeks and tries to exercise regularly. She does have bad days where it is hard to be around people and she needs to be excuse from work. No SI/HC.   [name]. Active Ambulatory Problems    Diagnosis Date Noted  . Eczema 07/17/2015  . 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 Attention deficit hyperactivity disorder (ADHD) 01/08/2016  . Migraine without status migrainosus, not intractable 02/26/2016  . Insomnia 05/22/2016  . 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 Paresthesias 09/10/2016  . Bipolar I disorder (HCC) 02/23/2017  . Bipolar 2 disorder (HCC) 07/07/2017  . Dehydration 12/16/2017  . Seasonal allergies 03/14/2018   Resolved Ambulatory Problems    Diagnosis Date Noted  . Vitamin D deficiency 06/20/2015  . Depression 07/17/2015  . Abnormal weight gain 07/19/2015  . Pain in joint, ankle and foot 08/02/2015  . Exposure to strep throat 09/07/2015  . Viral gastroenteritis 09/13/2015  . Abscess of skin 09/13/2015  . Pharyngitis 11/06/2015  . Migraine without aura and with status migrainosus, not intractable 05/22/2016  . Nausea without vomiting 05/24/2016  . 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 Dehydration 08/06/2016  . Episodic mood disorder (HCC) 11/11/2016  . Influenza-like illness 12/11/2016  . No energy 02/23/2017  . Side effects of treatment 02/23/2017   Past Medical History:  Diagnosis Date  . ADHD       Review of Systems  All other systems reviewed and are  negative.      Objective:   Physical Exam  Constitutional: She is oriented to person, place, and time. She appears well-developed and well-nourished.  HENT:  Head: Normocephalic and atraumatic.  Cardiovascular: Normal rate and regular rhythm.  Pulmonary/Chest: Effort normal and breath sounds normal.  Neurological: She is alert and oriented to person, place, and time.  Psychiatric: She has a normal mood and affect. Her behavior is normal.          Assessment & Plan:  Marland KitchenMarland KitchenDiagnoses and all orders for this visit:  Attention deficit hyperactivity disorder (ADHD), predominantly hyperactive type -     amphetamine-dextroamphetamine (ADDERALL) 30 MG tablet; Take 1 tablet by mouth 2 (two) times daily. -     amphetamine-dextroamphetamine (ADDERALL) 30 MG tablet; Take 1 tablet by mouth 2 (two) times daily. -     amphetamine-dextroamphetamine (ADDERALL) 30 MG tablet; Take 1 tablet by mouth 2 (two) times daily.  Anxiety -     clonazePAM (KLONOPIN) 0.5 MG tablet; Take 1 tablet (0.5 mg total) by mouth 2 (two) times daily as needed for anxiety.  Bipolar I disorder (HCC)   FMLA paperwork filled out for intermittent leave. See scanned copy.   Adderall refilled for 3 months.   As needed clonazepam. Discussed abuse potential. Encouraged not to take daily.

## 2018-09-06 ENCOUNTER — Ambulatory Visit (INDEPENDENT_AMBULATORY_CARE_PROVIDER_SITE_OTHER): Payer: No Typology Code available for payment source | Admitting: Licensed Clinical Social Worker

## 2018-09-06 DIAGNOSIS — F3181 Bipolar II disorder: Secondary | ICD-10-CM

## 2018-09-08 ENCOUNTER — Encounter (HOSPITAL_COMMUNITY): Payer: Self-pay | Admitting: Licensed Clinical Social Worker

## 2018-09-08 NOTE — Progress Notes (Signed)
   THERAPIST PROGRESS NOTE  Session Time: 10-11  Participation Level: Active  Behavioral Response: Neat and Well GroomedAlertEuthymic  Type of Therapy: Individual Therapy  Treatment Goals addressed: Communication: Assertiveness  Interventions: CBT  Summary: Victoria Barnes is a 32 y.o. 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 She is active, engaged in session and appears somewhat withdrawn and quieter than most sessions. She reports on her homework in which she had a "vulnerable conversation" w/ her sister and it was productive. Pt identified the ways that it increased her positive feelings and her core belief.   Suicidal/Homicidal: Nowithout intent/plan  Therapist Response: Counselor used open questions, active listening, and supportive encouragement. Counselor asked f/u questions to help pt identify the helpfulness of being assertive and sharing her feelings w/ others.   Plan: Return again in 2 weeks.  Diagnosis:    ICD-10-CM   1. Bipolar 2 disorder [facility]) F31.81      Margo Common, LCAS-A 09/08/2018

## 2018-09-16 ENCOUNTER — Ambulatory Visit (INDEPENDENT_AMBULATORY_CARE_PROVIDER_SITE_OTHER): Payer: No Typology Code available for payment source | Admitting: Licensed Clinical Social Worker

## 2018-09-16 DIAGNOSIS — F3181 Bipolar II disorder: Secondary | ICD-10-CM

## 2018-09-17 NOTE — Progress Notes (Signed)
Pt did not show for appointment. A call was made to pt asking for a return call to reschedule. No answer, VM was left.

## 2018-09-20 MED FILL — FLUCONAZOLE 150 MG TABS: 150 | 4 days supply | Qty: 2 | Fill #0

## 2018-09-23 ENCOUNTER — Ambulatory Visit (HOSPITAL_COMMUNITY): Payer: No Typology Code available for payment source | Admitting: Licensed Clinical Social Worker

## 2018-09-23 DIAGNOSIS — F3181 Bipolar II disorder: Secondary | ICD-10-CM

## 2018-09-24 ENCOUNTER — Encounter (HOSPITAL_COMMUNITY): Payer: Self-pay | Admitting: Licensed Clinical Social Worker

## 2018-09-24 NOTE — Progress Notes (Signed)
   THERAPIST PROGRESS NOTE  Session Time: 10-11  Participation Level: Active  Behavioral Response: Casual and NeatAlertEuthymic  Type of Therapy: Individual Therapy  Treatment Goals addressed: Coping  Interventions: CBT and DBT  Summary: Victoria Barnes is a 32 y.o. female who presents with Bipolar 2 and Borderline Features.  She reports she has "cut off her entire family from her love" because they are manipulating and interfering w/ her plans to host a Thanksgiving meal for her entire family. Pt is visibly agitated and defensive in session. Counselor spends time encouraging pt to consider varying perspectives of the problem and challenge her instinctual black and white thinking. Pt discusses problems of her mother living w/ her.    Suicidal/Homicidal: Nowithout intent/plan  Therapist Response: Pt was active, engaged, somewhat activated and denied any somatic feelings. She became defensive w/ counselor began to consider varying interpretations of the misunderstanding b/w family members. Counselor encouraged pt to assert herself w/ her mother and do a pros and cons of asking her mother to live somewhere else.   Plan: Return again in 2 weeks.  Diagnosis:    ICD-10-CM   1. Bipolar 2 disorder [facility](HCC) F31.81        Victoria Barnes, LCAS-A 09/24/2018

## 2018-09-29 ENCOUNTER — Other Ambulatory Visit: Payer: Self-pay

## 2018-09-29 MED ORDER — FLUCONAZOLE 150 MG PO TABS: 150.0000 mg | ORAL_TABLET | Freq: Once | ORAL | 0 refills | Status: AC

## 2018-09-29 MED FILL — FLUCONAZOLE 150 MG TABS: 150 | 2 days supply | Qty: 2 | Fill #0

## 2018-09-29 NOTE — Progress Notes (Signed)
flucon

## 2018-10-01 MED FILL — DEXTROAMPH TB 30MG NSTR 100: 30 | 30 days supply | Qty: 60 | Fill #0

## 2018-10-04 MED FILL — clonazePAM 0.5 MG TABS: 0.5 | 10 days supply | Qty: 20 | Fill #1

## 2018-10-06 ENCOUNTER — Ambulatory Visit (INDEPENDENT_AMBULATORY_CARE_PROVIDER_SITE_OTHER): Payer: No Typology Code available for payment source | Admitting: Licensed Clinical Social Worker

## 2018-10-06 DIAGNOSIS — F3181 Bipolar II disorder: Secondary | ICD-10-CM | POA: Diagnosis not present

## 2018-10-12 ENCOUNTER — Encounter (HOSPITAL_COMMUNITY): Payer: Self-pay | Admitting: Licensed Clinical Social Worker

## 2018-10-12 NOTE — Progress Notes (Signed)
   THERAPIST PROGRESS NOTE  Session Time: 2-3  Participation Level: Active  Behavioral Response: Well GroomedAlertEuthymic  Type of Therapy: Individual Therapy  Treatment Goals addressed: Diagnosis: bipolar 1  Interventions: CBT and DBT  Summary: Victoria Barnes is a 32 y.o. female who presents with Bipolar 1 for biweekly individual therapy.  Pt was active, engaged, made strong eye contact and had upbeat affect in session. She reports she asked her mother to move out via text and her mother agreed she would. Pt and counselor discuss pt's assertiveness techniques and assertive communication.    Suicidal/Homicidal: Nowithout intent/plan  Therapist Response: Counselor used open questions, active listening, and reflection. Counselor encouraged pt w/ clear reframes for harsh or overly rigid thinking. Pt is sleeping better, is pursing a new job and has an Copyinterview tomorrow. Counselor and pt discussed pt's coping skills and her use mindfulness of tolerating distress.   Plan: Return again in 2 weeks.  Diagnosis:    ICD-10-CM   1. Bipolar 2 disorder [facility](HCC) F31.81       Margo CommonWesley E Yazleemar Strassner, LCAS-A 10/12/2018

## 2018-10-15 ENCOUNTER — Other Ambulatory Visit: Payer: Self-pay

## 2018-10-15 ENCOUNTER — Ambulatory Visit (INDEPENDENT_AMBULATORY_CARE_PROVIDER_SITE_OTHER): Payer: No Typology Code available for payment source | Admitting: Licensed Clinical Social Worker

## 2018-10-15 DIAGNOSIS — F3181 Bipolar II disorder: Secondary | ICD-10-CM | POA: Diagnosis not present

## 2018-10-15 MED ORDER — FLUCONAZOLE 150 MG PO TABS: 150.0000 mg | ORAL_TABLET | Freq: Once | ORAL | 0 refills | Status: AC

## 2018-10-15 MED FILL — FLUCONAZOLE 150 MG TABS: 150 | 2 days supply | Qty: 2 | Fill #0

## 2018-10-20 ENCOUNTER — Encounter (HOSPITAL_COMMUNITY): Payer: Self-pay | Admitting: Licensed Clinical Social Worker

## 2018-10-20 NOTE — Progress Notes (Signed)
   THERAPIST PROGRESS NOTE  Session Time: 10-11  Participation Level: Active  Behavioral Response: Well GroomedAlertDysphoric  Type of Therapy: Individual Therapy  Treatment Goals addressed: Coping  Interventions: CBT and DBT  Summary: Victoria Barnes is a 32 y.o. female who presents with Bipolar 2 and Borderline Personality Features.  She states she is sad that she did not get a job she interviewed for. She is happy that she is not feeling debilitating depression but rather "healthy grief". Pt and counselor spend majority of session discussing pt's thoughts of death and vague SI. She admits she has no desire to kill herself but compared her desire to die like "a desire to leave a party that is kind of over; she is waiting for her ride to pick her up". Counselor spent significant time differentiating her SI from intent to die. Pt denies wanting to die but rather that she is ready to "go beyond this life".     Suicidal/Homicidal: Nowithout intent/plan  Therapist Response: Counselor used open questions, active listening, supportive encouragement. Counselor did Suicide assessment. Pt continues to report ongoing issues w/ loss of meaning for her life and lacking direction. 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   Plan: Return again in 2 weeks.  Diagnosis:    ICD-10-CM   1. Bipolar 2 disorder [facility](HCC) F31.81        Margo CommonWesley E Swan, LCAS-A 10/20/2018

## 2018-10-29 MED FILL — clonazePAM 0.5 MG TABS: 0.5 | 10 days supply | Qty: 20 | Fill #0

## 2018-11-08 ENCOUNTER — Ambulatory Visit (INDEPENDENT_AMBULATORY_CARE_PROVIDER_SITE_OTHER): Payer: No Typology Code available for payment source | Admitting: Physician Assistant

## 2018-11-08 ENCOUNTER — Encounter: Payer: Self-pay | Admitting: Physician Assistant

## 2018-11-08 VITALS — BP 116/72 | HR 79 | Ht 59.0 in | Wt 111.0 lb

## 2018-11-08 DIAGNOSIS — G43909 Migraine, unspecified, not intractable, without status migrainosus: Secondary | ICD-10-CM | POA: Diagnosis not present

## 2018-11-08 DIAGNOSIS — G43009 Migraine without aura, not intractable, without status migrainosus: Secondary | ICD-10-CM

## 2018-11-08 DIAGNOSIS — Z0000 Encounter for general adult medical examination without abnormal findings: Secondary | ICD-10-CM | POA: Diagnosis not present

## 2018-11-08 DIAGNOSIS — Z131 Encounter for screening for diabetes mellitus: Secondary | ICD-10-CM

## 2018-11-08 DIAGNOSIS — Z113 Encounter for screening for infections with a predominantly sexual mode of transmission: Secondary | ICD-10-CM

## 2018-11-08 DIAGNOSIS — F901 Attention-deficit hyperactivity disorder, predominantly hyperactive type: Secondary | ICD-10-CM

## 2018-11-08 DIAGNOSIS — Z13220 Encounter for screening for lipoid disorders: Secondary | ICD-10-CM

## 2018-11-08 MED ORDER — AMPHETAMINE-DEXTROAMPHETAMINE 30 MG PO TABS: 30.0000 mg | ORAL_TABLET | Freq: Two times a day (BID) | ORAL | 0 refills | Status: DC

## 2018-11-08 MED FILL — AMPHETAMINE SALTS 30 MG TAB: 30 | 30 days supply | Qty: 60 | Fill #0

## 2018-11-08 NOTE — Patient Instructions (Signed)

## 2018-11-08 NOTE — Progress Notes (Signed)
Subjective:     Victoria Barnes is a 33 y.o. female and is here for a comprehensive physical exam. The patient reports no problems.  Social History   Socioeconomic History  . Marital status: Single    Spouse name: Not on file  . Number of children: Not on file  . Years of education: Not on file  . Highest education level: Not on file  Occupational History  . Occupation: CMA  Social Needs  . Financial resource strain: Not hard at all  . Food insecurity:    Worry: Never true    Inability: Never true  . Transportation needs:    Medical: No    Non-medical: No  Tobacco Use  . Smoking status: Former Smoker    Types: Cigarettes    Last attempt to quit: 11/2014    Years since quitting: 4.0  . Smokeless tobacco: Never Used  . Tobacco comment: quit 2 years  Substance and Sexual Activity  . Alcohol use: Yes    Comment: once or twice a month - socially  . Drug use: No  . Sexual activity: Yes    Partners: Male    Birth control/protection: I.U.D.    Comment: condoms  Lifestyle  . Physical activity:    Days per week: 2 days    Minutes per session: 50 min  . Stress: To some extent  Relationships  . Social connections:    Talks on phone: Never    Gets together: Never    Attends religious service: More than 4 times per year    Active member of club or organization: No    Attends meetings of clubs or organizations: Never    Relationship status: Never married  . Intimate partner violence:    Fear of current or ex partner: No    Emotionally abused: No    Physically abused: No    Forced sexual activity: No  Other Topics Concern  . Not on file  Social History Narrative  . Not on file   Health Maintenance  Topic Date Due  . PAP SMEAR-Modifier  09/08/2020  . TETANUS/TDAP  09/03/2024  . INFLUENZA VACCINE  Completed  . HIV Screening  Completed    The following portions of the patient's history were reviewed and updated as appropriate: allergies, current medications, past  family history, past medical history, past social history, past surgical history and problem list.  Review of Systems A comprehensive review of systems was negative.   Objective:    BP 116/72   Pulse 79   Ht 4\' 11"  (1.499 m)   Wt 111 lb (50.3 kg)   BMI 22.42 kg/m  General appearance: alert, cooperative and appears stated age Head: Normocephalic, without obvious abnormality, atraumatic Eyes: conjunctivae/corneas clear. PERRL, EOM's intact. Fundi benign. Ears: normal TM's and external ear canals both ears Nose: Nares normal. Septum midline. Mucosa normal. No drainage or sinus tenderness. Throat: lips, mucosa, and tongue normal; teeth and gums normal Neck: no adenopathy, no carotid bruit, no JVD, supple, symmetrical, trachea midline and thyroid not enlarged, symmetric, no tenderness/mass/nodules Back: symmetric, no curvature. ROM normal. No CVA tenderness. Lungs: clear to auscultation bilaterally Heart: regular rate and rhythm, S1, S2 normal, no murmur, click, rub or gallop Abdomen: soft, non-tender; bowel sounds normal; no masses,  no organomegaly Extremities: extremities normal, atraumatic, no cyanosis or edema Pulses: 2+ and symmetric Skin: Skin color, texture, turgor normal. No rashes or lesions Lymph nodes: Cervical, supraclavicular, and axillary nodes normal. Neurologic: Alert and oriented  X 3, normal strength and tone. Normal symmetric reflexes. Normal coordination and gait    Assessment:    Healthy female exam.       Plan:    [name].[name].[name] was seen today for annual exam.  Diagnoses and all orders for this visit:  Routine physical examination  Attention deficit hyperactivity disorder (ADHD), predominantly hyperactive type -     amphetamine-dextroamphetamine (ADDERALL) 30 MG tablet; Take 1 tablet by mouth 2 (two) times daily. -     amphetamine-dextroamphetamine (ADDERALL) 30 MG tablet; Take 1 tablet by mouth 2 (two) times daily. -     amphetamine-dextroamphetamine  (ADDERALL) 30 MG tablet; Take 1 tablet by mouth 2 (two) times daily.  Migraine without status migrainosus, not intractable, unspecified migraine type  Migraine without aura and without status migrainosus, not intractable  Screening examination for STD (sexually transmitted disease) -     HIV antibody (with reflex) -     C. trachomatis/N. gonorrhoeae RNA  Screening for lipid disorders -     Lipid Panel w/reflex Direct LDL  Screening for diabetes mellitus -     COMPLETE METABOLIC PANEL WITH GFR   .[name]. [facility] 11/08/2018 03/12/2018 07/15/2017 06/17/2016 09/07/2014  Decreased Interest 1 0 2 3 3   Down, Depressed, Hopeless 1 0 2 3 2   PHQ - 2 Score 2 0 4 6 5   Altered sleeping 0 0 1 3 0  Tired, decreased energy 1 0 2 3 3   Change in appetite - 0 1 3 0  Feeling bad or failure about yourself  0 0 1 3 0  Trouble concentrating 1 0 2 3 3   Moving slowly or fidgety/restless 0 0 1 3 0  Suicidal thoughts 0 0 0 3 0  PHQ-9 Score 4 0 12 27 11   Difficult doing work/chores Not difficult at all - Somewhat difficult - -  Some encounter information is confidential and restricted. Go to Review Flowsheets activity to see all data.   .. Discussed 150 minutes of exercise a week.  Encouraged vitamin D 1000 units and Calcium 1300mg  or 4 servings of dairy a day.  Fasting labs ordered.  STD testing done today. No symptoms just screening.   Adderall refilled for 3 months.  See After Visit Summary for Counseling Recommendations

## 2018-11-09 LAB — COMPLETE METABOLIC PANEL WITH GFR
AG Ratio: 1.9 (calc) (ref 1.0–2.5)
ALBUMIN MSPROF: 4.2 g/dL (ref 3.6–5.1)
ALT: 12 U/L (ref 6–29)
AST: 14 U/L (ref 10–30)
Alkaline phosphatase (APISO): 44 U/L (ref 33–115)
BUN: 8 mg/dL (ref 7–25)
CALCIUM: 9 mg/dL (ref 8.6–10.2)
CHLORIDE: 105 mmol/L (ref 98–110)
CO2: 25 mmol/L (ref 20–32)
Creat: 0.69 mg/dL (ref 0.50–1.10)
GFR, EST AFRICAN AMERICAN: 135 mL/min/{1.73_m2} (ref >=60)
GFR, Est Non African American: 117 mL/min/{1.73_m2} (ref >=60)
GLOBULIN: 2.2 g/dL (ref 1.9–3.7)
Glucose, Bld: 77 mg/dL (ref 65–99)
POTASSIUM: 3.8 mmol/L (ref 3.5–5.3)
Sodium: 136 mmol/L (ref 135–146)
Total Bilirubin: 0.3 mg/dL (ref 0.2–1.2)
Total Protein: 6.4 g/dL (ref 6.1–8.1)

## 2018-11-09 LAB — LIPID PANEL W/REFLEX DIRECT LDL
CHOLESTEROL: 143 mg/dL (ref <200)
HDL: 49 mg/dL — ABNORMAL LOW (ref >50)
LDL CHOLESTEROL (CALC): 80 mg/dL
Non-HDL Cholesterol (Calc): 94 mg/dL (calc) (ref <130)
Total CHOL/HDL Ratio: 2.9 (calc) (ref <5.0)
Triglycerides: 55 mg/dL (ref <150)

## 2018-11-09 LAB — C. TRACHOMATIS/N. GONORRHOEAE RNA
C. trachomatis RNA, TMA: NOT DETECTED
N. GONORRHOEAE RNA, TMA: NOT DETECTED

## 2018-11-09 LAB — HIV ANTIBODY (ROUTINE TESTING W REFLEX): HIV 1&2 Ab, 4th Generation: NONREACTIVE

## 2018-11-09 NOTE — Progress Notes (Signed)
Call pt: LDL great. HDL good. Has been better in the past. Make sure exercising and eating good healthy fats.  Kidney, liver, glucose looks great.

## 2018-11-09 NOTE — Progress Notes (Signed)
Call pt: negative chlamydia/gonorrheo

## 2018-11-19 ENCOUNTER — Ambulatory Visit (INDEPENDENT_AMBULATORY_CARE_PROVIDER_SITE_OTHER): Payer: No Typology Code available for payment source | Admitting: Licensed Clinical Social Worker

## 2018-11-19 ENCOUNTER — Encounter (HOSPITAL_COMMUNITY): Payer: Self-pay | Admitting: Licensed Clinical Social Worker

## 2018-11-19 DIAGNOSIS — F3181 Bipolar II disorder: Secondary | ICD-10-CM | POA: Diagnosis not present

## 2018-11-19 NOTE — Progress Notes (Signed)
   THERAPIST PROGRESS NOTE  Session Time: 11-12  Participation Level: Active  Behavioral Response: Casual and Well GroomedAlertDysphoric  Type of Therapy: Individual Therapy  Treatment Goals addressed: Coping  Interventions: CBT and DBT  Summary: Victoria Barnes is a 33 y.o. female who presents with hx of depression and anxiety w/ Borderline features. She reports she has been down lately since she learned that a friend of hers died in a car wreck 3 days after Xmas 2019. She also reports late in session that she terminated a pregnancy that was unplanned. Pt recounted her various emotions, grief,sadness, and longing to "be free".  Counselor encourages pt to research Brainspotting as an intervention for her next session.   Suicidal/Homicidal: Nowithout intent/plan  Therapist Response: Counselor used open questions, frank, honest dialogue, therapeutic self disclosure to build rapport and encourage honesty. Counselor helped pt reframe her discomfort as "strugging w/ how to handle death".   Plan: Return again in 1 weeks.  Diagnosis:    ICD-10-CM   1. Bipolar 2 disorder [facility]) F31.81       Margo Common, LCAS-A 11/19/2018

## 2018-11-22 ENCOUNTER — Telehealth: Payer: No Typology Code available for payment source | Admitting: Family

## 2018-11-22 DIAGNOSIS — R399 Unspecified symptoms and signs involving the genitourinary system: Secondary | ICD-10-CM

## 2018-11-22 MED ORDER — NITROFURANTOIN MONOHYD MACRO 100 MG PO CAPS: 100.0000 mg | ORAL_CAPSULE | Freq: Two times a day (BID) | ORAL | 0 refills | Status: DC

## 2018-11-22 MED FILL — clonazePAM 0.5 MG TABS: 0.5 | 10 days supply | Qty: 20 | Fill #1

## 2018-11-22 NOTE — Progress Notes (Signed)

## 2018-11-23 MED FILL — NITROFURANTOIN MONO-MCR 100: 100 | 5 days supply | Qty: 10 | Fill #0

## 2018-11-29 ENCOUNTER — Ambulatory Visit (INDEPENDENT_AMBULATORY_CARE_PROVIDER_SITE_OTHER): Payer: No Typology Code available for payment source | Admitting: Licensed Clinical Social Worker

## 2018-11-29 DIAGNOSIS — F3181 Bipolar II disorder: Secondary | ICD-10-CM

## 2018-12-02 NOTE — Progress Notes (Signed)
PT did not show for appointment and did not answer phone call.

## 2018-12-06 ENCOUNTER — Other Ambulatory Visit: Payer: Self-pay

## 2018-12-06 MED ORDER — CLONAZEPAM 1 MG PO TABS: 1.0000 mg | ORAL_TABLET | Freq: Every day | ORAL | 1 refills | Status: DC

## 2018-12-06 MED FILL — clonazePAM 1 MG TABS: 1 | 20 days supply | Qty: 20 | Fill #0

## 2018-12-06 MED FILL — RIZATRIPTAN 10 MG ODT: 10 | 25 days supply | Qty: 10 | Fill #1

## 2018-12-06 NOTE — Telephone Encounter (Signed)
Ok I sent 1mg  dose to still keep to as needed dosing.

## 2018-12-06 NOTE — Telephone Encounter (Signed)
Pt called for a refill of clonazepam.  She stated that she has been doubling up her dose and that seems to be working for her. She is wondering if you'll up her dose to 1 mg. Please advise. -EH/RMA

## 2018-12-07 MED ORDER — CLONAZEPAM 1 MG PO TABS: 1.0000 mg | ORAL_TABLET | Freq: Every day | ORAL | 1 refills | Status: DC

## 2018-12-07 NOTE — Addendum Note (Signed)
Addended by: Thom Chimes on: 12/07/2018 08:43 AM   Modules accepted: Orders

## 2018-12-09 ENCOUNTER — Telehealth: Payer: No Typology Code available for payment source | Admitting: Family

## 2018-12-09 DIAGNOSIS — B373 Candidiasis of vulva and vagina: Secondary | ICD-10-CM | POA: Diagnosis not present

## 2018-12-09 DIAGNOSIS — B3731 Acute candidiasis of vulva and vagina: Secondary | ICD-10-CM

## 2018-12-09 MED ORDER — FLUCONAZOLE 150 MG PO TABS: 150.0000 mg | ORAL_TABLET | Freq: Once | ORAL | 0 refills | Status: AC

## 2018-12-09 MED FILL — clonazePAM 1 MG TABS: 1 | 20 days supply | Qty: 20 | Fill #0

## 2018-12-09 MED FILL — FLUCONAZOLE 150 MG TABS: 150 | 3 days supply | Qty: 2 | Fill #0

## 2018-12-09 NOTE — Progress Notes (Signed)
Greater than 5 minutes, yet less than 10 minutes of time have been spent researching, coordinating, and implementing care for this patient today.  Thank you for the details you included in the comment boxes. Those details are very helpful in determining the best course of treatment for you and help [place] to provide the best care.  We are sorry that you are not feeling well. Here is how we plan to help! Based on what you shared with me it looks like you: May have a yeast vaginosis  Vaginosis is an inflammation of the vagina that can result in discharge, itching and pain. The cause is usually a change in the normal balance of vaginal bacteria or an infection. Vaginosis can also result from reduced estrogen levels after menopause.  The most common causes of vaginosis are:   Bacterial vaginosis which results from an overgrowth of one on several organisms that are normally present in your vagina.   Yeast infections which are caused by a naturally occurring fungus called candida.   Vaginal atrophy (atrophic vaginosis) which results from the thinning of the vagina from reduced estrogen levels after menopause.   Trichomoniasis which is caused by a parasite and is commonly transmitted by sexual intercourse.  Factors that increase your risk of developing vaginosis include: [name] Medications, such as antibiotics and steroids . Uncontrolled diabetes . Use of hygiene products such as bubble bath, vaginal spray or vaginal deodorant . Douching . Wearing damp or tight-fitting clothing . Using an intrauterine device (IUD) for birth control . Hormonal changes, such as those associated with pregnancy, birth control pills or menopause . Sexual activity . Having a sexually transmitted infection  Your treatment plan is A single Diflucan (fluconazole) 150mg  tablet once.  I have electronically sent this prescription into the pharmacy that you have chosen. You may repeat in 72h if necessary.  Be sure to take all of  the medication as directed. Stop taking any medication if you develop a rash, tongue swelling or shortness of breath. Mothers who are breast feeding should consider pumping and discarding their breast milk while on these antibiotics. However, there is no consensus that infant exposure at these doses would be harmful.  Remember that medication creams can weaken latex condoms. [name]   HOME CARE:  Good hygiene may prevent some types of vaginosis from recurring and may relieve some symptoms:  . Avoid baths, hot tubs and whirlpool spas. Rinse soap from your outer genital area after a shower, and dry the area well to prevent irritation. Don't use scented or harsh soaps, such as those with deodorant or antibacterial action. [name] Avoid irritants. These include scented tampons and pads. . Wipe from front to back after using the toilet. Doing so avoids spreading fecal bacteria to your vagina.  Other things that may help prevent vaginosis include:  [name] Don't douche. Your vagina doesn't require cleansing other than normal bathing. Repetitive douching disrupts the normal organisms that reside in the vagina and can actually increase your risk of vaginal infection. Douching won't clear up a vaginal infection. . Use a latex condom. Both female and female latex condoms may help you avoid infections spread by sexual contact. . Wear cotton underwear. Also wear pantyhose with a cotton crotch. If you feel comfortable without it, skip wearing underwear to bed. Yeast thrives in [employer] Your symptoms should improve in the next day or two.  GET HELP RIGHT AWAY IF:  . You have pain in your lower abdomen ( pelvic area or  over your ovaries) . You develop nausea or vomiting . You develop a fever . Your discharge changes or worsens . You have persistent pain with intercourse . You develop shortness of breath, a rapid pulse, or you faint.  These symptoms could be signs of problems or infections that need to be  evaluated by a medical provider now.  MAKE SURE YOU    Understand these instructions.  Will watch your condition.  Will get help right away if you are not doing well or get worse.  Your e-visit answers were reviewed by a board certified advanced clinical practitioner to complete your personal care plan. Depending upon the condition, your plan could have included both over the counter or prescription medications. Please review your pharmacy choice to make sure that you have choses a pharmacy that is open for you to pick up any needed prescription, Your safety is important to [place]. If you have drug allergies check your prescription carefully.   You can use MyChart to ask questions about today's visit, request a non-urgent call back, or ask for a work or school excuse for 24 hours related to this e-Visit. If it has been greater than 24 hours you will need to follow up with your provider, or enter a new e-Visit to address those concerns. You will get a MyChart message within the next two days asking about your experience. I hope that your e-visit has been valuable and will speed your recovery.

## 2018-12-10 ENCOUNTER — Ambulatory Visit (INDEPENDENT_AMBULATORY_CARE_PROVIDER_SITE_OTHER): Payer: No Typology Code available for payment source | Admitting: Licensed Clinical Social Worker

## 2018-12-10 ENCOUNTER — Encounter (HOSPITAL_COMMUNITY): Payer: Self-pay | Admitting: Licensed Clinical Social Worker

## 2018-12-10 DIAGNOSIS — F3181 Bipolar II disorder: Secondary | ICD-10-CM | POA: Diagnosis not present

## 2018-12-10 MED FILL — AMPHETAMINE SALTS 30 MG TAB: 30 | 30 days supply | Qty: 60 | Fill #0

## 2018-12-10 NOTE — Progress Notes (Signed)
   THERAPIST PROGRESS NOTE  Session Time: 9-10  Participation Level: Active  Behavioral Response: Well GroomedAlertEuthymic  Type of Therapy: Individual Therapy  Treatment Goals addressed: Diagnosis: Bipolar  Interventions: Biofeedback and Other: Brainspotting  Summary: Victoria Barnes is a 33 y.o. female who presents with hx of trauma, and Bipolar Disorder. She states she looked into Brainspotting since last session and she is interested in starting today. Counselor and PT spend majority of session targeting issue of her ongoing adulterous relationship and her feelings. PT states she "has decided there is nothing she can do about it".    Suicidal/Homicidal: Nowithout intent/plan  Therapist Response: Counselor used open questions, active listening, and Brainspotting. She participates actively in Brainspotting and identifies an activated feeling on her right side. PT continues to label her relationship as "an addiction".   Plan: Return again in 2 weeks.  Diagnosis:    ICD-10-CM   1. Bipolar 2 disorder [facility]) F31.81        Margo Common, LCAS-A 12/10/2018

## 2018-12-20 ENCOUNTER — Ambulatory Visit (INDEPENDENT_AMBULATORY_CARE_PROVIDER_SITE_OTHER): Payer: No Typology Code available for payment source | Admitting: Licensed Clinical Social Worker

## 2018-12-20 ENCOUNTER — Encounter (HOSPITAL_COMMUNITY): Payer: Self-pay | Admitting: Licensed Clinical Social Worker

## 2018-12-20 DIAGNOSIS — F3181 Bipolar II disorder: Secondary | ICD-10-CM | POA: Diagnosis not present

## 2018-12-20 NOTE — Progress Notes (Signed)
   THERAPIST PROGRESS NOTE  Session Time: 10-11  Participation Level: Active  Behavioral Response: Well GroomedAlertHopeless  Type of Therapy: Individual Therapy  Treatment Goals addressed: Anxiety  Interventions: CBT and Motivational Interviewing  Summary: Victoria Barnes is a 33 y.o. female who presents with hx of Bipolar. She is flat and states she hasn't talked much yet today. She is confused about whether or not she was supposed to go to work before our session today. PT shares that she has allowed her mother to move back in w/ her though she has set more explicit boundaries and expectations. PT continues to struggle w/ ambivalence about whether or not she is capable of ending a relationships she is in. She leaves despondent and is sad that she has not "set a goal this week".   Suicidal/Homicidal: Nowithout intent/plan  Therapist Response: Counselor used open questions, active listening, and MI OARS skills, challenging PT's cognitions and core beliefs. PT continues to struggle w/ feeling helpless, she continues to hide behind labels of Bipolar and mental illness as excuses for acting on behaviors she does not like about herself. When these standards are criticized PT often refers to thoughts of death and wanting to go to "the beyond" to escape suffering though she denies wanting to kill herself.   Plan: Return again in 2 weeks.  Diagnosis:    ICD-10-CM   1. Bipolar 2 disorder [facility]) F31.81       Margo Common, LCAS-A 12/20/2018

## 2019-01-03 ENCOUNTER — Telehealth: Payer: Self-pay

## 2019-01-03 NOTE — Telephone Encounter (Signed)
Letter faxed patient notified -EH/RMA

## 2019-01-03 NOTE — Telephone Encounter (Signed)
She is not in the 48hr window of first symptoms but ok to send tamiflu 75mg  bid for 5 days #10 and work not for today and tomorrow.

## 2019-01-03 NOTE — Telephone Encounter (Signed)
Pt left vm stating that she has been experiencing flu like symptoms.  On Thursday and Friday she only had body aches and chills.  Starting last night she spiked a fever of 101.4.  She is asking for tamiflu and also a work note. Please advise. -EH/RMA

## 2019-01-04 ENCOUNTER — Encounter: Payer: Self-pay | Admitting: Physician Assistant

## 2019-01-04 ENCOUNTER — Ambulatory Visit (INDEPENDENT_AMBULATORY_CARE_PROVIDER_SITE_OTHER): Payer: No Typology Code available for payment source | Admitting: Physician Assistant

## 2019-01-04 VITALS — BP 126/75 | HR 124 | Temp 99.7°F | Wt 114.0 lb

## 2019-01-04 DIAGNOSIS — R05 Cough: Secondary | ICD-10-CM

## 2019-01-04 DIAGNOSIS — J189 Pneumonia, unspecified organism: Secondary | ICD-10-CM

## 2019-01-04 DIAGNOSIS — R509 Fever, unspecified: Secondary | ICD-10-CM | POA: Diagnosis not present

## 2019-01-04 DIAGNOSIS — R059 Cough, unspecified: Secondary | ICD-10-CM

## 2019-01-04 LAB — POCT INFLUENZA A/B
INFLUENZA B, POC: NEGATIVE
Influenza A, POC: NEGATIVE

## 2019-01-04 MED ORDER — AZITHROMYCIN 250 MG PO TABS: ORAL_TABLET | ORAL | 0 refills | Status: DC

## 2019-01-04 MED ORDER — ALBUTEROL SULFATE (2.5 MG/3ML) 0.083% IN NEBU: 2.5000 mg | INHALATION_SOLUTION | RESPIRATORY_TRACT | 6 refills | Status: DC | PRN

## 2019-01-04 MED ORDER — FLUCONAZOLE 150 MG PO TABS: 150.0000 mg | ORAL_TABLET | Freq: Once | ORAL | 0 refills | Status: AC

## 2019-01-04 MED ORDER — HYDROCOD POLST-CPM POLST ER 10-8 MG/5ML PO SUER: 5.0000 mL | Freq: Two times a day (BID) | ORAL | 0 refills | Status: DC | PRN

## 2019-01-04 MED FILL — AZITHROMYCIN 250 MG TABLET: 250 | 5 days supply | Qty: 6 | Fill #0

## 2019-01-04 MED FILL — HYDROCODONE-CHLORPHEN ER SU: 10-8 | 6 days supply | Qty: 60 | Fill #0

## 2019-01-04 MED FILL — FLUCONAZOLE 150 MG TABS: 150 | 2 days supply | Qty: 2 | Fill #0

## 2019-01-04 NOTE — Progress Notes (Signed)
Subjective:    Patient ID: Victoria Barnes, female    DOB: 12/23/1985, 33 y.o.   MRN: [id number]  HPI Pt is a 33 yo female who presents to the clinic with cough, fever, body aches, fatigue since Friday of last week. She just is not getting any better. She thought she may have the flu and empirical tamiflu was sent. She spiked another fever of 101 this morning. Her cough is dry to productive. Her chest feels tight. She is taking ibuprofen, tylenol, mucinex with little relief. She has been out of work since Friday.   .. Active Ambulatory Problems    Diagnosis Date Noted  . Eczema 07/17/2015  . 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 Attention deficit hyperactivity disorder (ADHD) 01/08/2016  . Migraine without status migrainosus, not intractable 02/26/2016  . Insomnia 05/22/2016  . 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 Paresthesias 09/10/2016  . Bipolar I disorder (HCC) 02/23/2017  . Bipolar 2 disorder (HCC) 07/07/2017  . Dehydration 12/16/2017  . Seasonal allergies 03/14/2018  . Anxiety 09/01/2018   Resolved Ambulatory Problems    Diagnosis Date Noted  . Vitamin D deficiency 06/20/2015  . Depression 07/17/2015  . Abnormal weight gain 07/19/2015  . Pain in joint, ankle and foot 08/02/2015  . Exposure to strep throat 09/07/2015  . Viral gastroenteritis 09/13/2015  . Abscess of skin 09/13/2015  . Pharyngitis 11/06/2015  . Migraine without aura and with status migrainosus, not intractable 05/22/2016  . Nausea without vomiting 05/24/2016  . 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 Dehydration 08/06/2016  . Episodic mood disorder (HCC) 11/11/2016  . Influenza-like illness 12/11/2016  . No energy 02/23/2017  . Side effects of treatment 02/23/2017   Past Medical History:  Diagnosis Date  . ADHD       Review of Systems  All other systems reviewed and are negative.      Objective:    Physical Exam Vitals signs reviewed.  Constitutional:      Appearance: She is ill-appearing.  HENT:     Head: Normocephalic.     Right Ear: Tympanic membrane and ear canal normal.     Left Ear: Tympanic membrane and ear canal normal.     Nose: Congestion present.     Mouth/Throat:     Mouth: Mucous membranes are moist.     Pharynx: Posterior oropharyngeal erythema present.  Eyes:     Pupils: Pupils are equal, round, and reactive to light.  Neck:     Musculoskeletal: Normal range of motion and neck supple.  Cardiovascular:     Rate and Rhythm: Normal rate and regular rhythm.     Pulses: Normal pulses.  Pulmonary:     Breath sounds: Rhonchi present.     Comments: Left lower lung coarse crackles.  Lymphadenopathy:     Cervical: No cervical adenopathy.  Neurological:     General: No focal deficit present.     Mental Status: She is alert.  Psychiatric:        Mood and Affect: Mood normal.        Behavior: Behavior normal.           Assessment & Plan:  Marland KitchenMarland KitchenAlexandria was seen today for fever.  Diagnoses and all orders for this visit:  Pneumonia due to infectious organism, unspecified laterality, unspecified part of lung -     azithromycin (ZITHROMAX) 250 MG tablet; Take 2 tablets now and then one tablet for  4 days. -     albuterol (PROVENTIL) (2.5 MG/3ML) 0.083% nebulizer solution; Take 3 mLs (2.5 mg total) by nebulization every 4 (four) hours as needed for wheezing or shortness of breath (please include nebulizer machine, hoses, and mask if needed.).  Fever, unspecified fever cause -     POCT Influenza A/B  Cough -     chlorpheniramine-HYDROcodone (TUSSIONEX) 10-8 MG/5ML SUER; Take 5 mLs by mouth every 12 (twelve) hours as needed.  Other orders -     fluconazole (DIFLUCAN) 150 MG tablet; Take 1 tablet (150 mg total) by mouth once for 1 dose. Repeat in 48-72 hours if symptoms persists.   .. Results for orders placed or performed in visit on 01/04/19  POCT Influenza A/B   Result Value Ref Range   Influenza A, POC Negative Negative   Influenza B, POC Negative Negative   No flu. Lung exam seems consistent with with pneumonia espeically with another fever spike. Will treat empirically with zpak. Use albuterol as needed.  Cough syrup sent to pharmacy for as needed usage.  Diflucan for yeast after abx use.  Had fever today. Written out until Friday.

## 2019-01-05 ENCOUNTER — Encounter: Payer: Self-pay | Admitting: Physician Assistant

## 2019-01-11 ENCOUNTER — Ambulatory Visit: Payer: Self-pay

## 2019-01-16 MED FILL — clonazePAM 1 MG TABS: 1 | 20 days supply | Qty: 20 | Fill #1

## 2019-01-17 MED FILL — AMPHETAMINE-DEXTROAMPHETAMI: 30 | 30 days supply | Qty: 60 | Fill #0

## 2019-01-26 ENCOUNTER — Other Ambulatory Visit: Payer: Self-pay

## 2019-01-26 MED ORDER — CLONAZEPAM 1 MG PO TABS: 1.0000 mg | ORAL_TABLET | Freq: Every day | ORAL | 1 refills | Status: DC

## 2019-02-03 MED FILL — ALBUTEROL 0.083 MG/ML SOLN: (2.5 MG/3ML | 5 days supply | Qty: 90 | Fill #0

## 2019-02-03 MED FILL — FLUTICASONE PROP 50 MCG SPR: 50 | 60 days supply | Qty: 16 | Fill #0

## 2019-02-03 MED FILL — FEXOFENADINE HCL 180 MG TAB: 180 | 30 days supply | Qty: 30 | Fill #1

## 2019-02-04 ENCOUNTER — Ambulatory Visit (INDEPENDENT_AMBULATORY_CARE_PROVIDER_SITE_OTHER): Payer: No Typology Code available for payment source | Admitting: Physician Assistant

## 2019-02-04 ENCOUNTER — Encounter: Payer: Self-pay | Admitting: Physician Assistant

## 2019-02-04 VITALS — HR 88 | Temp 98.6°F | Wt 115.0 lb

## 2019-02-04 DIAGNOSIS — F901 Attention-deficit hyperactivity disorder, predominantly hyperactive type: Secondary | ICD-10-CM

## 2019-02-04 DIAGNOSIS — F430 Acute stress reaction: Secondary | ICD-10-CM | POA: Diagnosis not present

## 2019-02-04 DIAGNOSIS — F4323 Adjustment disorder with mixed anxiety and depressed mood: Secondary | ICD-10-CM | POA: Diagnosis not present

## 2019-02-04 DIAGNOSIS — F3181 Bipolar II disorder: Secondary | ICD-10-CM | POA: Diagnosis not present

## 2019-02-04 MED ORDER — AMPHETAMINE-DEXTROAMPHETAMINE 30 MG PO TABS: 30.0000 mg | ORAL_TABLET | Freq: Two times a day (BID) | ORAL | 0 refills | Status: DC

## 2019-02-04 NOTE — Progress Notes (Signed)
Subjective:    Patient ID: Victoria Barnes, female    DOB: 12-23-1985, 33 y.o.   MRN: [id number]  HPI  Pt is a 33 yo female with Bipolar 2 who calls in via webex to discuss worsening mood, depression, anxiety for the last few weeks due to ongoing COVID crisis. 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 She feels like due to her fear she is overreacting and not providing the best care. She has a sickle cell son at home who is very high risk. Her son has had to be on a ventilator before due to a viral infection and she just "keeps picturing that happening again". She has been able to control most of her bipolar ups and downs without medication and counseling only.  Pt denies any SI/HC. She admits she has not been to counseling since 2/17 she does not feel like counseling is helping as much as it had in the past. She is trying to stay active with walking. She is using her CBT techniques previously learned.   Pt needs refill on adderall. She is doing well on it. No concerns or complaints. Doing well on current dose.   .. Active Ambulatory Problems    Diagnosis Date Noted  . Eczema 07/17/2015  . 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 Attention deficit hyperactivity disorder (ADHD) 01/08/2016  . Migraine without status migrainosus, not intractable 02/26/2016  . Insomnia 05/22/2016  . 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 Paresthesias 09/10/2016  . Bipolar 2 disorder (HCC) 07/07/2017  . Dehydration 12/16/2017  . Seasonal allergies 03/14/2018  . Anxiety 09/01/2018  . Acute stress reaction 02/07/2019   Resolved Ambulatory Problems    Diagnosis Date Noted  . Vitamin D deficiency 06/20/2015  . Depression 07/17/2015  . Abnormal weight gain 07/19/2015  . Pain in joint, ankle and foot 08/02/2015  . Exposure to strep throat 09/07/2015  . Viral gastroenteritis 09/13/2015  . Abscess of skin 09/13/2015  .  Pharyngitis 11/06/2015  . Migraine without aura and with status migrainosus, not intractable 05/22/2016  . Nausea without vomiting 05/24/2016  . 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 Dehydration 08/06/2016  . Episodic mood disorder (HCC) 11/11/2016  . Influenza-like illness 12/11/2016  . No energy 02/23/2017  . Side effects of treatment 02/23/2017   Past Medical History:  Diagnosis Date  . ADHD   . Bipolar I disorder (HCC)          Review of Systems See HPI.     Objective:   Physical Exam Vitals signs reviewed.  Constitutional:      Appearance: Normal appearance.  HENT:     Head: Normocephalic.  Cardiovascular:     Rate and Rhythm: Normal rate.  Pulmonary:     Effort: Pulmonary effort is normal.  Neurological:     General: No focal deficit present.     Mental Status: She is alert and oriented to person, place, and time.  Psychiatric:     Comments: Flat affect.            Assessment & Plan:  Marland KitchenMarland KitchenDiagnoses and all orders for this visit:  Bipolar 2 disorder (HCC) -     Ambulatory referral to Psychology  Attention deficit hyperactivity disorder (ADHD), predominantly hyperactive type -     amphetamine-dextroamphetamine (ADDERALL) 30 MG tablet; Take 1 tablet by mouth 2 (two) times daily. -  Ambulatory referral to Psychology  Situational mixed anxiety and depressive disorder -     Ambulatory referral to Psychology  Acute stress reaction -     Ambulatory referral to Psychology   .[name] Depression screen [facility] 2/9 02/07/2019 11/08/2018 03/12/2018 07/15/2017 06/17/2016  Decreased Interest 2 1 0 2 3  Down, Depressed, Hopeless 2 1 0 2 3  PHQ - 2 Score 4 2 0 4 6  Altered sleeping 2 0 0 1 3  Tired, decreased energy 2 1 0 2 3  Change in appetite 2 - 0 1 3  Feeling bad or failure about yourself  2 0 0 1 3  Trouble concentrating 2 1 0 2 3  Moving slowly or fidgety/restless 1 0 0 1 3  Suicidal thoughts 0 0 0 0 3  PHQ-9 Score 15  4 0 12 27  Difficult doing work/chores Very difficult Not difficult at all - Somewhat difficult -  Some encounter information is confidential and restricted. Go to Review Flowsheets activity to see all data.   .. GAD 7 : Generalized Anxiety Score 02/07/2019 03/12/2018 06/17/2016  Nervous, Anxious, on Edge 2 0 3  Control/stop worrying 2 0 3  Worry too much - different things 2 0 3  Trouble relaxing 2 0 3  Restless 2 0 3  Easily annoyed or irritable 2 0 3  Afraid - awful might happen 2 0 0  Total GAD 7 Score 14 0 18  Anxiety Difficulty Very difficult - -  Some encounter information is confidential and restricted. Go to Review Flowsheets activity to see all data.    I think it is reasonable through the peak of this pandemic to be written out from 01/28/19- 02/18/19. Pt may return to work 02/21/19.   I will fill out [employer] paperwork.   Pt needs counseling. Will make new referral for Jessica in office. I think a new perspective would be very beneficial. Discussed other ways to work through mood, stress and anxiety. Pt continues to not want to do daily medication. She is using klonapin for severe anxiety. Discussed dependency risk and to use as needed.   Follow up in 2 weeks virtually to make sure ready to go back to work.   [name].Spent 30 minutes with patient and greater than 50 percent of visit spent counseling patient regarding treatment plan.

## 2019-02-07 ENCOUNTER — Telehealth: Payer: Self-pay | Admitting: Physician Assistant

## 2019-02-07 ENCOUNTER — Encounter: Payer: Self-pay | Admitting: Physician Assistant

## 2019-02-07 DIAGNOSIS — F430 Acute stress reaction: Secondary | ICD-10-CM | POA: Insufficient documentation

## 2019-02-07 NOTE — Telephone Encounter (Signed)
Can we please schedule her for virtual follow up on 02/18/19.

## 2019-02-09 ENCOUNTER — Telehealth: Payer: Self-pay

## 2019-02-09 NOTE — Telephone Encounter (Signed)
FMLA form completed by provider/at providers request.   Copy placed up front to be scanned then picked up by patient. Copy sent to scan. Copy has also been faxed to  [employer] (confirmation received).

## 2019-02-10 MED FILL — clonazePAM 1 MG TABS: 1 | 30 days supply | Qty: 30 | Fill #0

## 2019-02-10 NOTE — Telephone Encounter (Signed)
Left message with information below and for patient to call us back to scheduele the webex.  °

## 2019-02-14 MED FILL — ALBUTEROL SUL 2.5 MG/3 ML S: (2.5 MG/3ML | 5 days supply | Qty: 90 | Fill #1

## 2019-02-15 MED FILL — AMPHETAMINE-DEXTROAMPHETAMI: 30 | 30 days supply | Qty: 60 | Fill #0

## 2019-02-18 ENCOUNTER — Telehealth (INDEPENDENT_AMBULATORY_CARE_PROVIDER_SITE_OTHER): Payer: No Typology Code available for payment source | Admitting: Physician Assistant

## 2019-02-18 VITALS — Temp 98.6°F | Ht 59.0 in | Wt 112.0 lb

## 2019-02-18 DIAGNOSIS — F4323 Adjustment disorder with mixed anxiety and depressed mood: Secondary | ICD-10-CM | POA: Diagnosis not present

## 2019-02-18 DIAGNOSIS — F430 Acute stress reaction: Secondary | ICD-10-CM

## 2019-02-18 DIAGNOSIS — F3181 Bipolar II disorder: Secondary | ICD-10-CM

## 2019-02-18 DIAGNOSIS — F909 Attention-deficit hyperactivity disorder, unspecified type: Secondary | ICD-10-CM

## 2019-02-18 DIAGNOSIS — F5105 Insomnia due to other mental disorder: Secondary | ICD-10-CM

## 2019-02-18 DIAGNOSIS — F99 Mental disorder, not otherwise specified: Secondary | ICD-10-CM

## 2019-02-18 NOTE — Progress Notes (Signed)
PHQ-9/Gad7 completed. Counseling called her but she hasn't made an appt.

## 2019-02-21 ENCOUNTER — Telehealth: Payer: Self-pay | Admitting: Neurology

## 2019-02-21 ENCOUNTER — Encounter: Payer: Self-pay | Admitting: Physician Assistant

## 2019-02-21 NOTE — Progress Notes (Signed)
Patient ID: Victoria Barnes, female   DOB: 1985/12/24, 33 y.o.   MRN: [id number] .[name].Virtual Visit via Video Note  I connected with Victoria Barnes on 02/21/19 at  1:00 PM EDT by a video enabled telemedicine application and verified that I am speaking with the correct person using two identifiers.   I discussed the limitations of evaluation and management by telemedicine and the availability of in person appointments. The patient expressed understanding and agreed to proceed.  History of Present Illness: Pt is a 33 yo female with Bipolar 2 who presents to the clinic to follow up after 3 weeks written out for mixed anxiety,depression. During the COVID pandemic pt has not being doing well. Her son is high risk with sickle cell and has had to be put on a ventilator before. She works in Teacher, [employer]. She continues to have problems focusing, concentrating etc but feels like she could go back to work. By now we have seen how social distancing is working and a lot more policies are in place to prevent spread. She continues to feel hopeless and helpless and anxious. It is still hard for her to sleep at night. She does more stable though. No SI/HC. She was called to schedule counseling appt but has not called them back.   .. Active Ambulatory Problems    Diagnosis Date Noted  . Eczema 07/17/2015  . 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 Attention deficit hyperactivity disorder (ADHD) 01/08/2016  . Migraine without status migrainosus, not intractable 02/26/2016  . Insomnia 05/22/2016  . 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 Paresthesias 09/10/2016  . Bipolar 2 disorder (HCC) 07/07/2017  . Dehydration 12/16/2017  . Seasonal allergies 03/14/2018  . Anxiety 09/01/2018  . Acute stress reaction 02/07/2019   Resolved Ambulatory Problems    Diagnosis Date Noted  . Vitamin D deficiency 06/20/2015  . Depression 07/17/2015  . Abnormal weight gain 07/19/2015  .  Pain in joint, ankle and foot 08/02/2015  . Exposure to strep throat 09/07/2015  . Viral gastroenteritis 09/13/2015  . Abscess of skin 09/13/2015  . Pharyngitis 11/06/2015  . Migraine without aura and with status migrainosus, not intractable 05/22/2016  . Nausea without vomiting 05/24/2016  . 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 Dehydration 08/06/2016  . Episodic mood disorder (HCC) 11/11/2016  . Influenza-like illness 12/11/2016  . No energy 02/23/2017  . Side effects of treatment 02/23/2017   Past Medical History:  Diagnosis Date  . ADHD   . Bipolar I disorder (HCC)       Observations/Objective: No acute distress.   .. Today's Vitals   02/18/19 1259  Temp: 98.6 F (37 C)  TempSrc: Oral  Weight: 112 lb (50.8 kg)  Height: 4\' 11"  (1.499 m)   Body mass index is 22.62 kg/m.   Assessment and Plan: [name].[name].[name] was seen today for letter for school/work.  Diagnoses and all orders for this visit:  Situational mixed anxiety and depressive disorder  Attention deficit hyperactivity disorder (ADHD), unspecified ADHD type  Bipolar 2 disorder (HCC)  Insomnia due to other mental disorder  Acute stress reaction   Pt has not started counseling yet and I do think she would benefit from this. Please schedule soon. Pt is still struggling with thoughts and feelings as well as being able to concentrate and complete task. 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 I am going to  release her to go back on 4/20. Discussed safety measures for her and her son.   -filled out paperwork for patient.   Follow Up Instructions:    I discussed the assessment and treatment plan with the patient. The patient was provided an opportunity to ask questions and all were answered. The patient agreed with the plan and demonstrated an understanding of the instructions.   The patient was advised to call back or  seek an in-person evaluation if the symptoms worsen or if the condition fails to improve as anticipated.  I provided 25 minutes of non-face-to-face time during this encounter.   Tandy Gaw, PA-C

## 2019-02-21 NOTE — Telephone Encounter (Signed)
[place] forms completed, faxed to [phone] with confirmation received.

## 2019-02-22 ENCOUNTER — Telehealth: Payer: No Typology Code available for payment source | Admitting: Physician Assistant

## 2019-02-22 DIAGNOSIS — N760 Acute vaginitis: Secondary | ICD-10-CM | POA: Diagnosis not present

## 2019-02-22 MED ORDER — FLUCONAZOLE 150 MG PO TABS: 150.0000 mg | ORAL_TABLET | Freq: Once | ORAL | 0 refills | Status: AC

## 2019-02-22 MED FILL — FLUCONAZOLE 150 MG TABS: 150 | 2 days supply | Qty: 2 | Fill #0

## 2019-02-22 NOTE — Progress Notes (Signed)
We are sorry that you are not feeling well. Here is how we plan to help! Based on what you shared with me it looks like you: May have a yeast vaginosis   Vaginosis is an inflammation of the vagina that can result in discharge, itching and pain. The cause is usually a change in the normal balance of vaginal bacteria or an infection. Vaginosis can also result from reduced estrogen levels after menopause.  The most common causes of vaginosis are:   Bacterial vaginosis which results from an overgrowth of one on several organisms that are normally present in your vagina.   Yeast infections which are caused by a naturally occurring fungus called candida.   Vaginal atrophy (atrophic vaginosis) which results from the thinning of the vagina from reduced estrogen levels after menopause.   Trichomoniasis which is caused by a parasite and is commonly transmitted by sexual intercourse.  Factors that increase your risk of developing vaginosis include: Medications, such as antibiotics and steroids Uncontrolled diabetes Use of hygiene products such as bubble bath, vaginal spray or vaginal deodorant Douching Wearing damp or tight-fitting clothing Using an intrauterine device (IUD) for birth control Hormonal changes, such as those associated with pregnancy, birth control pills or menopause Sexual activity Having a sexually transmitted infection  Your treatment plan is A single Diflucan (fluconazole) 150mg  tablet once, repeat in 72 hours if needed.  I have electronically sent this prescription into the pharmacy that you have chosen.  Be sure to take all of the medication as directed. Stop taking any medication if you develop a rash, tongue swelling or shortness of breath. Mothers who are breast feeding should consider pumping and discarding their breast milk while on these antibiotics. However, there is no consensus that infant exposure at these doses would be harmful.  Remember that medication creams can  weaken latex condoms. [name].   HOME CARE:  Good hygiene may prevent some types of vaginosis from recurring and may relieve some symptoms:  Avoid baths, hot tubs and whirlpool spas. Rinse soap from your outer genital area after a shower, and dry the area well to prevent irritation. Don't use scented or harsh soaps, such as those with deodorant or antibacterial action. Avoid irritants. These include scented tampons and pads. Wipe from front to back after using the toilet. Doing so avoids spreading fecal bacteria to your vagina.  Other things that may help prevent vaginosis include:  Don't douche. Your vagina doesn't require cleansing other than normal bathing. Repetitive douching disrupts the normal organisms that reside in the vagina and can actually increase your risk of vaginal infection. Douching won't clear up a vaginal infection. Use a latex condom. Both female and female latex condoms may help you avoid infections spread by sexual contact. Wear cotton underwear. Also wear pantyhose with a cotton crotch. If you feel comfortable without it, skip wearing underwear to bed. Yeast thrives in [employer] Hot[employer]moist environments Your symptoms should improve in the next day or two.  GET HELP RIGHT AWAY IF:  You have pain in your lower abdomen ( pelvic area or over your ovaries) You develop nausea or vomiting You develop a fever Your discharge changes or worsens You have persistent pain with intercourse You develop shortness of breath, a rapid pulse, or you faint.  These symptoms could be signs of problems or infections that need to be evaluated by a medical provider now.  MAKE SURE YOU   Understand these instructions. Will watch your condition. Will get help right away if you  are not doing well or get worse.  Your e-visit answers were reviewed by a board certified advanced clinical practitioner to complete your personal care plan. Depending upon the condition, your plan could have included both over the  counter or prescription medications. Please review your pharmacy choice to make sure that you have choses a pharmacy that is open for you to pick up any needed prescription, Your safety is important to [place]. If you have drug allergies check your prescription carefully.   You can use MyChart to ask questions about today's visit, request a non-urgent call back, or ask for a work or school excuse for 24 hours related to this e-Visit. If it has been greater than 24 hours you will need to follow up with your provider, or enter a new e-Visit to address those concerns. You will get a MyChart message within the next two days asking about your experience. I hope that your e-visit has been valuable and will speed your recovery.  ===View-only below this line===   ----- Message -----    From: [name]    Sent: 02/22/2019  3:51 PM EDT      To: E-Visit Mailing List Subject: E-Visit Submission: Vaginal Symptoms  E-Visit Submission: Vaginal Symptoms --------------------------------  Question: Which of the following are you experiencing? Answer:   Vaginal discharge  Question: Are you having pain while passing urine? Answer:   No, I have no pain while urinating  Question: Which of the following applies to your vaginal discharge Answer:   I have a white/milky discharge  Question: Are you pregnant? Answer:   I am confident that I am not pregnant  Question: Are you breastfeeding? Answer:   No  Question: Which of the following are you experiencing? Answer:   None of the above  Question: Do you have any sores on your genitals? Answer:   No  Question: Have you taken antibiotics recently? Answer:   Yes, I recently took some  Question: Please enter when you took antibiotics, and the name of the medicine, if you know it. Answer:   03/20 azithromycin tab  Question: Do you do any of the following? Answer:   I take bubble baths  Question: Which of the following applies to your menstrual  period? Answer:   I had a menstrual period in the last 2 weeks  Question: Have you had similar symptoms in the past? Answer:   Yes, I have had similar symptoms more than once before  Question: When you had similar symptoms in  the past, did any of the following work? Answer:   Pills for yeast infection            Cream for yeast infection  Question: Do you have a fever? Answer:   No, I do not have a fever  Question: During the past 2 months, have you had sexual contact with a specific person for the first time? Answer:   No  Question: Has a person with whom you have had sexual contact been recently told they have a disease possibly acquired through sex? Answer:   No  Question: Please list your medication allergies that you may have ? (If 'none' , please list as 'none') Answer:   On file  Question: Please list any additional comments  Answer:   I have a yeast infection. Typically when I get a yeast infection I am written 2 fluconazole tablets and have to take the second tablet about 72 hours after the first for effective  treatment. Thanks  A total of 5-10 minutes was spent evaluating this patients questionnaire and formulating a plan of care.

## 2019-03-09 MED FILL — clonazePAM 1 MG TABS: 1 | 30 days supply | Qty: 30 | Fill #1

## 2019-03-22 ENCOUNTER — Other Ambulatory Visit: Payer: Self-pay

## 2019-03-22 DIAGNOSIS — F901 Attention-deficit hyperactivity disorder, predominantly hyperactive type: Secondary | ICD-10-CM

## 2019-03-23 MED ORDER — AMPHETAMINE-DEXTROAMPHETAMINE 30 MG PO TABS: 30.0000 mg | ORAL_TABLET | Freq: Two times a day (BID) | ORAL | 0 refills | Status: DC

## 2019-03-23 MED FILL — AMPHETAMINE-DEXTROAMPHETAMI: 30 | 30 days supply | Qty: 60 | Fill #0

## 2019-03-23 NOTE — Progress Notes (Signed)
Sent!

## 2019-04-26 ENCOUNTER — Encounter: Payer: Self-pay | Admitting: Physician Assistant

## 2019-04-26 ENCOUNTER — Ambulatory Visit (INDEPENDENT_AMBULATORY_CARE_PROVIDER_SITE_OTHER): Payer: No Typology Code available for payment source | Admitting: Physician Assistant

## 2019-04-26 VITALS — BP 123/80 | HR 108 | Temp 98.6°F | Ht 59.0 in | Wt 110.0 lb

## 2019-04-26 DIAGNOSIS — G43909 Migraine, unspecified, not intractable, without status migrainosus: Secondary | ICD-10-CM

## 2019-04-26 DIAGNOSIS — L659 Nonscarring hair loss, unspecified: Secondary | ICD-10-CM | POA: Diagnosis not present

## 2019-04-26 DIAGNOSIS — G43009 Migraine without aura, not intractable, without status migrainosus: Secondary | ICD-10-CM | POA: Diagnosis not present

## 2019-04-26 DIAGNOSIS — F901 Attention-deficit hyperactivity disorder, predominantly hyperactive type: Secondary | ICD-10-CM

## 2019-04-26 DIAGNOSIS — F3181 Bipolar II disorder: Secondary | ICD-10-CM

## 2019-04-26 DIAGNOSIS — F419 Anxiety disorder, unspecified: Secondary | ICD-10-CM

## 2019-04-26 DIAGNOSIS — R000 Tachycardia, unspecified: Secondary | ICD-10-CM

## 2019-04-26 MED ORDER — AMPHETAMINE-DEXTROAMPHETAMINE 30 MG PO TABS: 30.0000 mg | ORAL_TABLET | Freq: Two times a day (BID) | ORAL | 0 refills | Status: DC

## 2019-04-26 MED ORDER — CLOBETASOL PROPIONATE 0.05 % EX CREA: 1.0000 "application " | TOPICAL_CREAM | Freq: Two times a day (BID) | CUTANEOUS | 1 refills | Status: DC

## 2019-04-26 MED ORDER — RIZATRIPTAN BENZOATE 10 MG PO TBDP: 10.0000 mg | ORAL_TABLET | ORAL | 5 refills | Status: DC | PRN

## 2019-04-26 MED FILL — CLOBETASOL PROPIONATE 0.05: 0.05 | 30 days supply | Qty: 60 | Fill #0

## 2019-04-26 MED FILL — RIZATRIPTAN 10 MG ODT: 10 | 30 days supply | Qty: 10 | Fill #0

## 2019-04-26 NOTE — Patient Instructions (Signed)
Alopecia Areata, Adult    Alopecia areata is a condition that causes you to lose hair. You may lose hair on your scalp in patches. In some cases, you may lose all the hair on your scalp (alopecia totalis) or all the hair from your face and body (alopecia universalis).  Alopecia areata is an autoimmune disease. This means that your body's defense system (immune system) mistakes normal parts of the body for germs or other things that can make you sick. When you have alopecia areata, the immune system attacks the hair follicles.  Alopecia areata usually develops in childhood, but it can develop at any age. For some people, their hair grows back on its own and hair loss does not happen again. For others, their hair may fall out and grow back in cycles. The hair loss may last many years. Having this condition can be emotionally difficult, but it is not dangerous.  What are the causes?  The cause of this condition is not known.  What increases the risk?  This condition is more likely to develop in people who have:   A family history of alopecia.   A family history of another autoimmune disease, including type 1 diabetes and rheumatoid arthritis.   Asthma and allergies.   Down syndrome.  What are the signs or symptoms?  Round spots of patchy hair loss on the scalp is the main symptom of this condition. The spots may be mildly itchy. Other symptoms include:   Short dark hairs in the bald patches that are wider at the top (exclamation point hairs).   Dents, white spots, or lines in the fingernails or toenails.   Balding and body hair loss. This is rare.  How is this diagnosed?  This condition is diagnosed based on your symptoms and family history. Your health care provider will also check your scalp skin, teeth, and nails. Your health care provider may refer you to a specialist in hair and skin disorders (dermatologist). You may also have tests, including:   A hair pull test.   Blood tests or other screening tests  to check for autoimmune diseases, such as thyroid disease or diabetes.   Skin biopsy to confirm the diagnosis.   A procedure to examine the skin with a lighted magnifying instrument (dermoscopy).  How is this treated?  There is no cure for alopecia areata. Treatment is aimed at promoting the regrowth of hair and preventing the immune system from overreacting. No single treatment is right for all people with alopecia areata. It depends on the type of hair loss you have and how severe it is. Work with your health care provider to find the best treatment for you. Treatment may include:   Having regular checkups to make sure the condition is not getting worse (watchful waiting).   Steroid creams or pills for 6-8 weeks to stop the immune reaction and help hair to regrow more quickly.   Other topical medicines to alter the immune system response and support the hair growth cycle.   Steroid injections.   Therapy and counseling with a support group or therapist if you are having trouble coping with hair loss.  Follow these instructions at home:   Learn as much as you can about your condition.   Apply topical creams only as told by your health care provider.   Take over-the-counter and prescription medicines only as told by your health care provider.   Consider getting a wig or products to make hair look fuller   or to cover bald spots, if you feel uncomfortable with your appearance.   Get therapy or counseling if you are having a hard time coping with hair loss. Ask your health care provider to recommend a counselor or support group.   Keep all follow-up visits as told by your health care provider. This is important.  Contact a health care provider if:   Your hair loss gets worse, even with treatment.   You have new symptoms.   You are struggling emotionally.  Summary   Alopecia areata is an autoimmune condition that makes your body's defense system (immune system) attack the hair follicles. This causes you  to lose hair.   Treatments may include regular checkups to make sure that the condition is not getting worse (watchful waiting), medicines, and steroid injections.  This information is not intended to replace advice given to you by your health care provider. Make sure you discuss any questions you have with your health care provider.  Document Released: 05/24/2004 Document Revised: 11/07/2016 Document Reviewed: 11/07/2016  Elsevier Interactive Patient Education  2019 [employer]

## 2019-04-26 NOTE — Progress Notes (Signed)
Subjective:    Patient ID: Victoria Barnes, female    DOB: 10-Dec-1985, 33 y.o.   MRN: [id number]  HPI  Pt is a 33 yo female with PmHx of bipolar, GAD, alopecia, ADHD who presents to the clinic with worsening hair loss. She reports hx of patches of hair loss that resolved about 8-10 years ago with injections in scalp. She has noticed they are reoccurring. She wonders what to do and if her "hormones' could be off. She reports patches of hair loss up to quarter size. She denies and pulling on areas. She does where wigs.   She is currently not working. She feels like that has been really good for her. COVID has increased anxiety.   Her migraines are controlled for the most part. maxalt is used for rescue and works well.  .. Active Ambulatory Problems    Diagnosis Date Noted  . Eczema 07/17/2015  . 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 Attention deficit hyperactivity disorder (ADHD) 01/08/2016  . Migraine without status migrainosus, not intractable 02/26/2016  . Insomnia 05/22/2016  . 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 Paresthesias 09/10/2016  . Bipolar 2 disorder (HCC) 07/07/2017  . Dehydration 12/16/2017  . Seasonal allergies 03/14/2018  . Anxiety 09/01/2018  . Acute stress reaction 02/07/2019  . Tachycardia 05/02/2019  . Alopecia 05/02/2019   Resolved Ambulatory Problems    Diagnosis Date Noted  . Vitamin D deficiency 06/20/2015  . Depression 07/17/2015  . Abnormal weight gain 07/19/2015  . Pain in joint, ankle and foot 08/02/2015  . Exposure to strep throat 09/07/2015  . Viral gastroenteritis 09/13/2015  . Abscess of skin 09/13/2015  . Pharyngitis 11/06/2015  . Migraine without aura and with status migrainosus, not intractable 05/22/2016  . Nausea without vomiting 05/24/2016  . 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 Dehydration 08/06/2016  . Episodic mood disorder (HCC)  11/11/2016  . Influenza-like illness 12/11/2016  . No energy 02/23/2017  . Side effects of treatment 02/23/2017   Past Medical History:  Diagnosis Date  . ADHD   . Bipolar I disorder (HCC)       Review of Systems See HPI.     Objective:   Physical Exam Vitals signs reviewed.  HENT:     Head: Normocephalic.     Comments: Pt has wig on today. She brings in photo of 2-3 areas of complete patches of hair loss.  Cardiovascular:     Rate and Rhythm: Regular rhythm. Tachycardia present.     Pulses: Normal pulses.  Pulmonary:     Effort: Pulmonary effort is normal.     Breath sounds: Normal breath sounds.  Neurological:     General: No focal deficit present.     Mental Status: She is alert and oriented to person, place, and time.  Psychiatric:        Mood and Affect: Mood normal.        Behavior: Behavior normal.           Assessment & Plan:  [name].[name].[name] was seen today for adhd.  Diagnoses and all orders for this visit:  Alopecia -     TSH -     T4, free -     B12 and Folate Panel -     VITAMIN D 25 Hydroxy (Vit-D Deficiency, Fractures) -     ANA -     Testosterone -     Progesterone -  FSH/LH -     Estradiol -     CBC with Differential/Platelet -     clobetasol cream (TEMOVATE) 0.05 %; Apply 1 application topically 2 (two) times daily. -     Testosterone, Total, LC/MS/MS  Migraine without status migrainosus, not intractable, unspecified migraine type -     rizatriptan (MAXALT-MLT) 10 MG disintegrating tablet; Take 1 tablet (10 mg total) by mouth as needed for migraine. May repeat in 2 hours if needed  Migraine without aura and without status migrainosus, not intractable -     rizatriptan (MAXALT-MLT) 10 MG disintegrating tablet; Take 1 tablet (10 mg total) by mouth as needed for migraine. May repeat in 2 hours if needed  Attention deficit hyperactivity disorder (ADHD), predominantly hyperactive type -     amphetamine-dextroamphetamine (ADDERALL) 30 MG  tablet; Take 1 tablet by mouth 2 (two) times daily. -     amphetamine-dextroamphetamine (ADDERALL) 30 MG tablet; Take 1 tablet by mouth 2 (two) times daily. -     amphetamine-dextroamphetamine (ADDERALL) 30 MG tablet; Take 1 tablet by mouth 2 (two) times daily.  Tachycardia -     TSH -     T4, free -     B12 and Folate Panel -     VITAMIN D 25 Hydroxy (Vit-D Deficiency, Fractures) -     ANA -     Testosterone -     Progesterone -     FSH/LH -     Estradiol -     CBC with Differential/Platelet  Bipolar 2 disorder (HCC)  Anxiety   .[name]. [facility] 04/26/2019 02/18/2019 02/07/2019 11/08/2018 03/12/2018  Decreased Interest 0 3 2 1  0  Down, Depressed, Hopeless 0 3 2 1  0  PHQ - 2 Score 0 6 4 2  0  Altered sleeping 0 3 2 0 0  Tired, decreased energy 2 3 2 1  0  Change in appetite 0 0 2 - 0  Feeling bad or failure about yourself  0 2 2 0 0  Trouble concentrating 0 3 2 1  0  Moving slowly or fidgety/restless 0 3 1 0 0  Suicidal thoughts 0 0 0 0 0  PHQ-9 Score 2 20 15 4  0  Difficult doing work/chores Not difficult at all Very difficult Very difficult Not difficult at all -  Some encounter information is confidential and restricted. Go to Review Flowsheets activity to see all data.  Some recent data might be hidden   .[name]. GAD 7 : Generalized Anxiety Score 04/26/2019 02/18/2019 02/07/2019 03/12/2018  Nervous, Anxious, on Edge 0 0 2 0  Control/stop worrying 0 3 2 0  Worry too much - different things 0 3 2 0  Trouble relaxing 0 3 2 0  Restless 0 3 2 0  Easily annoyed or irritable 2 3 2  0  Afraid - awful might happen 0 0 2 0  Total GAD 7 Score 2 15 14  0  Anxiety Difficulty Not difficult at all Very difficult Very difficult -  Some encounter information is confidential and restricted. Go to Review Flowsheets activity to see all data.     Refilled medications today.  BP looks great.  HR is elevated. Will get labs. Pt did not take stimulant today. Encouraged her to monitor. She has a  hx of up and down HR. Will check CBC and tSH.  Hair loss seems consistent with alopecia areata. Given topical steroid to try. If would like dermatology referral for injections will do. Blood work to  look for other autoimmune issues. HO given.   Mood seems to be doing better. Encouraged to continue with counseling.

## 2019-04-27 ENCOUNTER — Other Ambulatory Visit: Payer: Self-pay | Admitting: Physician Assistant

## 2019-04-27 MED FILL — AMPHETAMINE-DEXTROAMPHETAMI: 30 | 30 days supply | Qty: 60 | Fill #0

## 2019-04-27 NOTE — Progress Notes (Signed)
Call pt: thyroid in the normal range but has decreased some to suggest HYPER active thyroid but free levels are still in normal range. Certainly something to follow.   B12 looks great.   Hormones look good.   Pending labs.

## 2019-04-28 MED FILL — clonazePAM 1 MG TABS: 1 | 30 days supply | Qty: 30 | Fill #0

## 2019-04-28 NOTE — Telephone Encounter (Signed)
Last filled 01/26/2019 for #30 with one refill.  Please advise.

## 2019-04-28 NOTE — Progress Notes (Signed)
Vitamin D low normal. Make sure taking at least 1000 units daily. ANA negative. Not anemic.

## 2019-04-29 LAB — FSH/LH
FSH: 5.6 m[IU]/mL
LH: 6.2 m[IU]/mL

## 2019-04-29 LAB — CBC WITH DIFFERENTIAL/PLATELET
Absolute Monocytes: 307 cells/uL (ref 200–950)
Basophils Absolute: 30 cells/uL (ref 0–200)
Basophils Relative: 0.5 %
Eosinophils Absolute: 218 cells/uL (ref 15–500)
Eosinophils Relative: 3.7 %
HCT: 38.9 % (ref 35.0–45.0)
Hemoglobin: 12.8 g/dL (ref 11.7–15.5)
Lymphs Abs: 1788 cells/uL (ref 850–3900)
MCH: 29.6 pg (ref 27.0–33.0)
MCHC: 32.9 g/dL (ref 32.0–36.0)
MCV: 89.8 fL (ref 80.0–100.0)
MPV: 11.5 fL (ref 7.5–12.5)
Monocytes Relative: 5.2 %
Neutro Abs: 3558 cells/uL (ref 1500–7800)
Neutrophils Relative %: 60.3 %
Platelets: 241 10*3/uL (ref 140–400)
RBC: 4.33 10*6/uL (ref 3.80–5.10)
RDW: 12.2 % (ref 11.0–15.0)
Total Lymphocyte: 30.3 %
WBC: 5.9 10*3/uL (ref 3.8–10.8)

## 2019-04-29 LAB — ESTRADIOL: Estradiol: 51 pg/mL

## 2019-04-29 LAB — TSH: TSH: 0.59 mIU/L

## 2019-04-29 LAB — VITAMIN D 25 HYDROXY (VIT D DEFICIENCY, FRACTURES): Vit D, 25-Hydroxy: 33 ng/mL (ref 30–100)

## 2019-04-29 LAB — T4, FREE: Free T4: 1.1 ng/dL (ref 0.8–1.8)

## 2019-04-29 LAB — ANA: Anti Nuclear Antibody (ANA): NEGATIVE

## 2019-04-29 LAB — B12 AND FOLATE PANEL
Folate: 11.4 ng/mL
Vitamin B-12: 609 pg/mL (ref 200–1100)

## 2019-04-29 LAB — TESTOSTERONE, TOTAL, LC/MS/MS: Testosterone, Total, LC-MS-MS: 35 ng/dL (ref 2–45)

## 2019-04-29 LAB — PROGESTERONE: Progesterone: 0.8 ng/mL

## 2019-05-02 DIAGNOSIS — R000 Tachycardia, unspecified: Secondary | ICD-10-CM | POA: Insufficient documentation

## 2019-05-02 DIAGNOSIS — L659 Nonscarring hair loss, unspecified: Secondary | ICD-10-CM | POA: Insufficient documentation

## 2019-05-30 MED FILL — AMPHETAMINE-DEXTROAMPHETAMI: 30 | 30 days supply | Qty: 60 | Fill #0

## 2019-07-08 MED FILL — clonazePAM 1 MG TABS: 1 | 30 days supply | Qty: 30 | Fill #1

## 2019-07-08 MED FILL — AMPHETAMINE-DEXTROAMPHETAMI: 30 | 30 days supply | Qty: 60 | Fill #0

## 2019-08-08 ENCOUNTER — Other Ambulatory Visit: Payer: Self-pay

## 2019-08-08 DIAGNOSIS — F901 Attention-deficit hyperactivity disorder, predominantly hyperactive type: Secondary | ICD-10-CM

## 2019-08-09 MED ORDER — AMPHETAMINE-DEXTROAMPHETAMINE 30 MG PO TABS: 30.0000 mg | ORAL_TABLET | Freq: Two times a day (BID) | ORAL | 0 refills | Status: DC

## 2019-08-09 MED ORDER — CLONAZEPAM 1 MG PO TABS: 1.0000 mg | ORAL_TABLET | Freq: Every day | ORAL | 1 refills | Status: DC

## 2019-08-09 MED FILL — clonazePAM 1 MG TABS: 1 | 30 days supply | Qty: 30 | Fill #0

## 2019-08-09 MED FILL — AMPHETAMINE-DEXTROAMPHETAMI: 30 | 30 days supply | Qty: 60 | Fill #0

## 2019-08-09 NOTE — Progress Notes (Signed)
Sent for the month. You are due for refills.

## 2019-09-19 MED FILL — AMPHETAMINE-DEXTROAMPHETAMI: 30 | 30 days supply | Qty: 60 | Fill #0

## 2019-10-27 ENCOUNTER — Other Ambulatory Visit: Payer: Self-pay | Admitting: Physician Assistant

## 2019-10-27 DIAGNOSIS — F901 Attention-deficit hyperactivity disorder, predominantly hyperactive type: Secondary | ICD-10-CM

## 2019-10-31 MED FILL — AMPHETAMINE-DEXTROAMPHETAMI: 30 | 30 days supply | Qty: 60 | Fill #0

## 2019-12-07 ENCOUNTER — Other Ambulatory Visit: Payer: Self-pay | Admitting: Sports Medicine

## 2019-12-07 DIAGNOSIS — F901 Attention-deficit hyperactivity disorder, predominantly hyperactive type: Secondary | ICD-10-CM

## 2019-12-07 MED FILL — AMPHETAMINE-DEXTROAMPHETAMI: 30 | 30 days supply | Qty: 60 | Fill #0

## 2019-12-07 NOTE — Telephone Encounter (Signed)
To PCP

## 2019-12-07 NOTE — Telephone Encounter (Signed)
Patient scheduled for 12/13/19 @ 1:20pm

## 2019-12-13 ENCOUNTER — Ambulatory Visit (INDEPENDENT_AMBULATORY_CARE_PROVIDER_SITE_OTHER): Payer: 59 | Admitting: Physician Assistant

## 2019-12-13 VITALS — BP 115/71 | HR 88 | Ht 59.0 in | Wt 113.0 lb

## 2019-12-13 DIAGNOSIS — E559 Vitamin D deficiency, unspecified: Secondary | ICD-10-CM

## 2019-12-13 DIAGNOSIS — F419 Anxiety disorder, unspecified: Secondary | ICD-10-CM

## 2019-12-13 DIAGNOSIS — F3181 Bipolar II disorder: Secondary | ICD-10-CM

## 2019-12-13 DIAGNOSIS — R5383 Other fatigue: Secondary | ICD-10-CM | POA: Diagnosis not present

## 2019-12-13 DIAGNOSIS — F901 Attention-deficit hyperactivity disorder, predominantly hyperactive type: Secondary | ICD-10-CM

## 2019-12-13 MED ORDER — CLONAZEPAM 1 MG PO TABS: 1.0000 mg | ORAL_TABLET | Freq: Every day | ORAL | 1 refills | Status: DC

## 2019-12-13 MED ORDER — LISDEXAMFETAMINE DIMESYLATE 70 MG PO CAPS: 70.0000 mg | ORAL_CAPSULE | Freq: Every day | ORAL | 0 refills | Status: DC

## 2019-12-13 MED FILL — clonazePAM 1 MG TABS: 1 | 30 days supply | Qty: 30 | Fill #0

## 2019-12-13 MED FILL — VYVANSE 70 MG CAPSULE: 70 | 30 days supply | Qty: 30 | Fill #0

## 2019-12-13 NOTE — Progress Notes (Signed)
Subjective:    Patient ID: Victoria Barnes, female    DOB: 02/22/1986, 34 y.o.   MRN: [id number]  HPI Pt is a 34 yo female with ADHD and Bipolar 2 who presents to the clinic for medication refills.   She would like to talk about switching her Adderall to Vyvanse.  She was on Vyvanse before.  She feels like being on the Adderall immediate release may be given her first focus but not lasting her in between.  She would like to see the difference with the more extended release.  She has ongoing bipolar 2 disorder in which she is not medically treated.  She previously went to counseling but that has dropped off.  She does admit to mood swings and battling through the ups and downs of this disease.  Patient also admits to some sleep issues.  She feels like she is not getting restful sleep.  She feels like she drinks a lot of the night.  She has no problems going to sleep.  She denies any snoring she denies any apnea episodes.  She does admit to feeling more fatigued over the last few months.  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   .. Active Ambulatory Problems    Diagnosis Date Noted  . Eczema 07/17/2015  . 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 Attention deficit hyperactivity disorder (ADHD) 01/08/2016  . Migraine without status migrainosus, not intractable 02/26/2016  . 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 Paresthesias 09/10/2016  . Fatigue 02/23/2017  . Bipolar 2 disorder ([place]) 07/07/2017  . Dehydration 12/16/2017  . Seasonal allergies 03/14/2018  . Anxiety 09/01/2018  . Acute stress reaction 02/07/2019  . Tachycardia 05/02/2019  . Alopecia 05/02/2019   Resolved Ambulatory Problems    Diagnosis Date Noted  . Vitamin D deficiency 06/20/2015  . Depression 07/17/2015  . Abnormal weight gain 07/19/2015  . Pain in joint, ankle and foot 08/02/2015  . Exposure to strep  throat 09/07/2015  . Viral gastroenteritis 09/13/2015  . Abscess of skin 09/13/2015  . Pharyngitis 11/06/2015  . Migraine without aura and with status migrainosus, not intractable 05/22/2016  . Insomnia 05/22/2016  . Nausea without vomiting 05/24/2016  . Severe recurrent major depression without psychotic features ([place]) 06/24/2016  . Dehydration 08/06/2016  . Episodic mood disorder ([place]) 11/11/2016  . Influenza-like illness 12/11/2016  . Side effects of treatment 02/23/2017   Past Medical History:  Diagnosis Date  . ADHD   . Bipolar I disorder ([place])      Review of Systems See HPI.     Objective:   Physical Exam Vitals reviewed.  Constitutional:      Appearance: Normal appearance.  Cardiovascular:     Rate and Rhythm: Normal rate.     Pulses: Normal pulses.  Pulmonary:     Effort: Pulmonary effort is normal.  Neurological:     General: No focal deficit present.     Mental Status: She is alert and oriented to person, place, and time.  Psychiatric:        Mood and Affect: Mood normal.    .. Depression screen [facility] 2/9 12/13/2019 04/26/2019 02/18/2019 02/07/2019 11/08/2018  Decreased Interest 1 0 3 2 1   Down, Depressed, Hopeless 0 0 3 2 1   PHQ - 2 Score 1 0 6 4 2   Altered sleeping 3 0 3 2 0  Tired, decreased energy 3 2 3  2 1  Change in appetite 0 0 0 2 -  Feeling bad or failure about yourself  0 0 2 2 0  Trouble concentrating 3 0 3 2 1   Moving slowly or fidgety/restless 0 0 3 1 0  Suicidal thoughts 0 0 0 0 0  PHQ-9 Score 10 2 20 15 4   Difficult doing work/chores Very difficult Not difficult at all Very difficult Very difficult Not difficult at all  Some encounter information is confidential and restricted. Go to Review Flowsheets activity to see all data.  Some recent data might be hidden   . GAD 7 : Generalized Anxiety Score 12/13/2019 04/26/2019 02/18/2019 02/07/2019  Nervous, Anxious, on Edge 2 0 0 2  Control/stop worrying 3 0 3 2  Worry too much - different things 3 0 3  2  Trouble relaxing 1 0 3 2  Restless 0 0 3 2  Easily annoyed or irritable 0 2 3 2   Afraid - awful might happen 0 0 0 2  Total GAD 7 Score 9 2 15 14   Anxiety Difficulty - Not difficult at all Very difficult Very difficult  Some encounter information is confidential and restricted. Go to Review Flowsheets activity to see all data.           Assessment & Plan:  4/19/20206/6/2020[name] was seen today for adhd.  Diagnoses and all orders for this visit:  Attention deficit hyperactivity disorder (ADHD), predominantly hyperactive type -     lisdexamfetamine (VYVANSE) 70 MG capsule; Take 1 capsule (70 mg total) by mouth daily.  Fatigue, unspecified type -     TSH -     CBC -     VITAMIN D 25 Hydroxy (Vit-D Deficiency, Fractures) -     B12 and Folate Panel -     COMPLETE METABOLIC PANEL WITH GFR  Bipolar 2 disorder (HCC)  Anxiety -     clonazePAM (KLONOPIN) 1 MG tablet; Take 1 tablet (1 mg total) by mouth daily. As needed for acute anxiety and panic.  Vitamin D insufficiency -     VITAMIN D 25 Hydroxy (Vit-D Deficiency, Fractures)   Will switch to vyvanse. Previously on 70mg . Let me know in a month how you are doing on this dose.   Pt is going to sleep and staying asleep but she is dreaming a lot. Wondering if changing stimulant could help. No risk factors for apnea such as snoring, obesity. She is more tired. Will check labs for any metabolic reasons. She does have hx of vitamin D deficiency.   Refilled klonapin for as needed use. Pt is using sparingly. She agrees to getting back in with counselor. Discussed some options with her. She will try to find ones she wants to go to. Declined referral today.   Discussed she is not treating her bipolar with medication but could infact be causing some sleep issues and even fatigue. Encouraged regular exercise and meditation.   Follow up as needed or in 6 months.

## 2019-12-14 ENCOUNTER — Encounter: Payer: Self-pay | Admitting: Physician Assistant

## 2019-12-14 LAB — CBC
HCT: 37.2 % (ref 35.0–45.0)
Hemoglobin: 12.2 g/dL (ref 11.7–15.5)
MCH: 29.8 pg (ref 27.0–33.0)
MCHC: 32.8 g/dL (ref 32.0–36.0)
MCV: 91 fL (ref 80.0–100.0)
MPV: 12.1 fL (ref 7.5–12.5)
Platelets: 210 10*3/uL (ref 140–400)
RBC: 4.09 10*6/uL (ref 3.80–5.10)
RDW: 11.7 % (ref 11.0–15.0)
WBC: 6.1 10*3/uL (ref 3.8–10.8)

## 2019-12-14 LAB — COMPLETE METABOLIC PANEL WITH GFR
AG Ratio: 1.7 (calc) (ref 1.0–2.5)
ALT: 13 U/L (ref 6–29)
AST: 15 U/L (ref 10–30)
Albumin: 4.1 g/dL (ref 3.6–5.1)
Alkaline phosphatase (APISO): 45 U/L (ref 31–125)
BUN: 10 mg/dL (ref 7–25)
CO2: 27 mmol/L (ref 20–32)
Calcium: 8.8 mg/dL (ref 8.6–10.2)
Chloride: 107 mmol/L (ref 98–110)
Creat: 0.73 mg/dL (ref 0.50–1.10)
GFR, Est African American: 127 mL/min/{1.73_m2} (ref >=60)
GFR, Est Non African American: 110 mL/min/{1.73_m2} (ref >=60)
Globulin: 2.4 g/dL (calc) (ref 1.9–3.7)
Glucose, Bld: 67 mg/dL (ref 65–99)
Potassium: 3.6 mmol/L (ref 3.5–5.3)
Sodium: 141 mmol/L (ref 135–146)
Total Bilirubin: 0.4 mg/dL (ref 0.2–1.2)
Total Protein: 6.5 g/dL (ref 6.1–8.1)

## 2019-12-14 LAB — TSH: TSH: 0.44 mIU/L

## 2019-12-14 LAB — VITAMIN D 25 HYDROXY (VIT D DEFICIENCY, FRACTURES): Vit D, 25-Hydroxy: 52 ng/mL (ref 30–100)

## 2019-12-14 LAB — B12 AND FOLATE PANEL
Folate: 15 ng/mL
Vitamin B-12: 717 pg/mL (ref 200–1100)

## 2019-12-14 NOTE — Progress Notes (Signed)
Victoria Barnes,   Thyroid stable.  Normal hemoglobin and stable from years past.  Vitamin D perfect! B12 looks amazing.  Kidney, liver glucose looks great.   Labs look fabulous and no reason for fatigue found.

## 2020-01-11 ENCOUNTER — Encounter: Payer: Self-pay | Admitting: Physician Assistant

## 2020-01-11 DIAGNOSIS — F901 Attention-deficit hyperactivity disorder, predominantly hyperactive type: Secondary | ICD-10-CM

## 2020-01-11 MED ORDER — LISDEXAMFETAMINE DIMESYLATE 70 MG PO CAPS: 70.0000 mg | ORAL_CAPSULE | Freq: Every day | ORAL | 0 refills | Status: DC

## 2020-01-11 MED FILL — VYVANSE 70 MG CAPSULE: 70 | 30 days supply | Qty: 30 | Fill #0

## 2020-01-13 ENCOUNTER — Other Ambulatory Visit: Payer: Self-pay | Admitting: Physician Assistant

## 2020-01-13 DIAGNOSIS — F901 Attention-deficit hyperactivity disorder, predominantly hyperactive type: Secondary | ICD-10-CM

## 2020-01-13 MED ORDER — AMPHETAMINE-DEXTROAMPHETAMINE 30 MG PO TABS: 30.0000 mg | ORAL_TABLET | Freq: Two times a day (BID) | ORAL | 0 refills | Status: DC

## 2020-01-13 MED ORDER — AMPHETAMINE-DEXTROAMPHETAMINE 30 MG PO TABS: 1.0000 | ORAL_TABLET | Freq: Two times a day (BID) | ORAL | 0 refills | Status: DC

## 2020-01-13 MED FILL — AMPHETAMINE-DEXTROAMPHETAMI: 30 | 30 days supply | Qty: 60 | Fill #0

## 2020-02-13 MED FILL — AMPHETAMINE-DEXTROAMPHETAMI: 30 | 30 days supply | Qty: 60 | Fill #0

## 2020-03-21 ENCOUNTER — Other Ambulatory Visit: Payer: Self-pay | Admitting: Physician Assistant

## 2020-03-21 NOTE — Telephone Encounter (Signed)
Last filled 02/13/2020 #60 no RF Last appt 12/13/2019

## 2020-03-26 NOTE — Telephone Encounter (Signed)
She is scheduled with metheney at 2 tomorrow. I will see her if she can come in a little early. I don't think Mrs Victoria Barnes is going to come in tomorrow anyway. Will you see if she can come in at 1 or 1:30 and switch to me.

## 2020-03-26 NOTE — Telephone Encounter (Signed)
She moved appt - can you deny RX for now?

## 2020-03-26 NOTE — Telephone Encounter (Signed)
Left message on machine for patient to call back to see if she can change appt.

## 2020-03-27 ENCOUNTER — Encounter: Payer: 59 | Admitting: Family Medicine

## 2020-03-27 ENCOUNTER — Encounter: Payer: Self-pay | Admitting: Physician Assistant

## 2020-03-27 ENCOUNTER — Other Ambulatory Visit (HOSPITAL_COMMUNITY)
Admission: RE | Admit: 2020-03-27 | Discharge: 2020-03-27 | Disposition: A | Discharge status: Home / Self Care | Payer: 59 | Source: Ambulatory Visit | Attending: Physician Assistant | Admitting: Physician Assistant

## 2020-03-27 ENCOUNTER — Ambulatory Visit (INDEPENDENT_AMBULATORY_CARE_PROVIDER_SITE_OTHER): Payer: 59 | Admitting: Physician Assistant

## 2020-03-27 VITALS — BP 116/75 | HR 84 | Ht 59.0 in | Wt 124.0 lb

## 2020-03-27 DIAGNOSIS — Z0000 Encounter for general adult medical examination without abnormal findings: Secondary | ICD-10-CM

## 2020-03-27 DIAGNOSIS — Z131 Encounter for screening for diabetes mellitus: Secondary | ICD-10-CM | POA: Diagnosis not present

## 2020-03-27 DIAGNOSIS — Z1329 Encounter for screening for other suspected endocrine disorder: Secondary | ICD-10-CM | POA: Diagnosis not present

## 2020-03-27 DIAGNOSIS — Z124 Encounter for screening for malignant neoplasm of cervix: Secondary | ICD-10-CM

## 2020-03-27 DIAGNOSIS — G43909 Migraine, unspecified, not intractable, without status migrainosus: Secondary | ICD-10-CM | POA: Diagnosis not present

## 2020-03-27 DIAGNOSIS — F901 Attention-deficit hyperactivity disorder, predominantly hyperactive type: Secondary | ICD-10-CM

## 2020-03-27 DIAGNOSIS — Z113 Encounter for screening for infections with a predominantly sexual mode of transmission: Secondary | ICD-10-CM

## 2020-03-27 DIAGNOSIS — G43009 Migraine without aura, not intractable, without status migrainosus: Secondary | ICD-10-CM

## 2020-03-27 DIAGNOSIS — Z13220 Encounter for screening for lipoid disorders: Secondary | ICD-10-CM | POA: Diagnosis not present

## 2020-03-27 DIAGNOSIS — J302 Other seasonal allergic rhinitis: Secondary | ICD-10-CM

## 2020-03-27 MED ORDER — AMPHETAMINE-DEXTROAMPHETAMINE 30 MG PO TABS: 1.0000 | ORAL_TABLET | Freq: Two times a day (BID) | ORAL | 0 refills | Status: DC

## 2020-03-27 MED ORDER — AMPHETAMINE-DEXTROAMPHETAMINE 30 MG PO TABS: 30.0000 mg | ORAL_TABLET | Freq: Two times a day (BID) | ORAL | 0 refills | Status: DC

## 2020-03-27 MED ORDER — RIZATRIPTAN BENZOATE 10 MG PO TBDP: 10.0000 mg | ORAL_TABLET | ORAL | 5 refills | Status: DC | PRN

## 2020-03-27 MED ORDER — FEXOFENADINE HCL 180 MG PO TABS: 180.0000 mg | ORAL_TABLET | Freq: Every day | ORAL | 3 refills | Status: DC

## 2020-03-27 MED ORDER — FLUTICASONE PROPIONATE 50 MCG/ACT NA SUSP: 1.0000 | Freq: Every day | NASAL | 3 refills | Status: DC

## 2020-03-27 MED FILL — AMPHETAMINE-DEXTROAMPHETAMI: 30 | 30 days supply | Qty: 60 | Fill #0

## 2020-03-27 MED FILL — FLUTICASONE PROP 50 MCG SPR: 50 | 60 days supply | Qty: 16 | Fill #0

## 2020-03-27 MED FILL — RIZATRIPTAN 10 MG ODT: 10 | 5 days supply | Qty: 10 | Fill #0

## 2020-03-27 NOTE — Patient Instructions (Signed)
Health Maintenance, Female Adopting a healthy lifestyle and getting preventive care are important in promoting health and wellness. Ask your health care provider about:  The right schedule for you to have regular tests and exams.  Things you can do on your own to prevent diseases and keep yourself healthy. What should I know about diet, weight, and exercise? Eat a healthy diet   Eat a diet that includes plenty of vegetables, fruits, low-fat dairy products, and lean protein.  Do not eat a lot of foods that are high in solid fats, added sugars, or sodium. Maintain a healthy weight Body mass index (BMI) is used to identify weight problems. It estimates body fat based on height and weight. Your health care provider can help determine your BMI and help you achieve or maintain a healthy weight. Get regular exercise Get regular exercise. This is one of the most important things you can do for your health. Most adults should:  Exercise for at least 150 minutes each week. The exercise should increase your heart rate and make you sweat (moderate-intensity exercise).  Do strengthening exercises at least twice a week. This is in addition to the moderate-intensity exercise.  Spend less time sitting. Even light physical activity can be beneficial. Watch cholesterol and blood lipids Have your blood tested for lipids and cholesterol at 32 years of age, then have this test every 5 years. Have your cholesterol levels checked more often if:  Your lipid or cholesterol levels are high.  You are older than 32 years of age.  You are at high risk for heart disease. What should I know about cancer screening? Depending on your health history and family history, you may need to have cancer screening at various ages. This may include screening for:  Breast cancer.  Cervical cancer.  Colorectal cancer.  Skin cancer.  Lung cancer. What should I know about heart disease, diabetes, and high blood  pressure? Blood pressure and heart disease  High blood pressure causes heart disease and increases the risk of stroke. This is more likely to develop in people who have high blood pressure readings, are of African descent, or are overweight.  Have your blood pressure checked: ? Every 3-5 years if you are 18-39 years of age. ? Every year if you are 40 years old or older. Diabetes Have regular diabetes screenings. This checks your fasting blood sugar level. Have the screening done:  Once every three years after age 40 if you are at a normal weight and have a low risk for diabetes.  More often and at a younger age if you are overweight or have a high risk for diabetes. What should I know about preventing infection? Hepatitis B If you have a higher risk for hepatitis B, you should be screened for this virus. Talk with your health care provider to find out if you are at risk for hepatitis B infection. Hepatitis C Testing is recommended for:  Everyone born from 1945 through 1965.  Anyone with known risk factors for hepatitis C. Sexually transmitted infections (STIs)  Get screened for STIs, including gonorrhea and chlamydia, if: ? You are sexually active and are younger than 32 years of age. ? You are older than 32 years of age and your health care provider tells you that you are at risk for this type of infection. ? Your sexual activity has changed since you were last screened, and you are at increased risk for chlamydia or gonorrhea. Ask your health care provider if   you are at risk.  Ask your health care provider about whether you are at high risk for HIV. Your health care provider may recommend a prescription medicine to help prevent HIV infection. If you choose to take medicine to prevent HIV, you should first get tested for HIV. You should then be tested every 3 months for as long as you are taking the medicine. Pregnancy  If you are about to stop having your period (premenopausal) and  you may become pregnant, seek counseling before you get pregnant.  Take 400 to 800 micrograms (mcg) of folic acid every day if you become pregnant.  Ask for birth control (contraception) if you want to prevent pregnancy. Osteoporosis and menopause Osteoporosis is a disease in which the bones lose minerals and strength with aging. This can result in bone fractures. If you are 65 years old or older, or if you are at risk for osteoporosis and fractures, ask your health care provider if you should:  Be screened for bone loss.  Take a calcium or vitamin D supplement to lower your risk of fractures.  Be given hormone replacement therapy (HRT) to treat symptoms of menopause. Follow these instructions at home: Lifestyle  Do not use any products that contain nicotine or tobacco, such as cigarettes, e-cigarettes, and chewing tobacco. If you need help quitting, ask your health care provider.  Do not use street drugs.  Do not share needles.  Ask your health care provider for help if you need support or information about quitting drugs. Alcohol use  Do not drink alcohol if: ? Your health care provider tells you not to drink. ? You are pregnant, may be pregnant, or are planning to become pregnant.  If you drink alcohol: ? Limit how much you use to 0-1 drink a day. ? Limit intake if you are breastfeeding.  Be aware of how much alcohol is in your drink. In the [place] one drink equals one 12 oz bottle of beer (355 mL), one 5 oz glass of wine (148 mL), or one 1 oz glass of hard liquor (44 mL). General instructions  Schedule regular health, dental, and eye exams.  Stay current with your vaccines.  Tell your health care provider if: ? You often feel depressed. ? You have ever been abused or do not feel safe at home. Summary  Adopting a healthy lifestyle and getting preventive care are important in promoting health and wellness.  Follow your health care provider's instructions about healthy  diet, exercising, and getting tested or screened for diseases.  Follow your health care provider's instructions on monitoring your cholesterol and blood pressure. This information is not intended to replace advice given to you by your health care provider. Make sure you discuss any questions you have with your health care provider. Document Revised: 10/13/2018 Document Reviewed: 10/13/2018 Elsevier Patient Education  2020 Elsevier Inc.  

## 2020-03-27 NOTE — Progress Notes (Signed)
Subjective:     Victoria Barnes is a 34 y.o. female and is here for a comprehensive physical exam. The patient reports no problems.  ADHD controlled doing well on Adderall. Needs refills.  Migraines controlled needs rescue for as needed migraine.   Social History   Socioeconomic History  . Marital status: Single    Spouse name: Not on file  . Number of children: Not on file  . Years of education: Not on file  . Highest education level: Not on file  Occupational History  . Occupation: CMA  Tobacco Use  . Smoking status: Former Smoker    Types: Cigarettes    Quit date: 11/2014    Years since quitting: 5.4  . Smokeless tobacco: Never Used  . Tobacco comment: quit 2 years  Substance and Sexual Activity  . Alcohol use: Yes    Comment: once or twice a month - socially  . Drug use: No  . Sexual activity: Yes    Partners: Male    Birth control/protection: I.U.D.    Comment: condoms  Other Topics Concern  . Not on file  Social History Narrative  . Not on file   Social Determinants of Health   Financial Resource Strain:   . Difficulty of Paying Living Expenses:   Food Insecurity:   . Worried About Programme researcher, broadcasting/film/video in the Last Year:   . Barista in the Last Year:   Transportation Needs:   . Freight forwarder (Medical):   [name] Lack of Transportation (Non-Medical):   Physical Activity:   . Days of Exercise per Week:   . Minutes of Exercise per Session:   Stress:   . Feeling of Stress :   Social Connections:   . Frequency of Communication with Friends and Family:   . Frequency of Social Gatherings with Friends and Family:   . Attends Religious Services:   . Active Member of Clubs or Organizations:   . Attends Banker Meetings:   [name] Marital Status:   Intimate Partner Violence:   . Fear of Current or Ex-Partner:   . Emotionally Abused:   [name] Physically Abused:   . Sexually Abused:    Health Maintenance  Topic Date Due  . COVID-19 Vaccine (1)  Never done  . INFLUENZA VACCINE  06/03/2020  . PAP SMEAR-Modifier  09/08/2020  . TETANUS/TDAP  09/03/2024  . HIV Screening  Completed    The following portions of the patient's history were reviewed and updated as appropriate: allergies, current medications, past family history, past medical history, past social history, past surgical history and problem list.  Review of Systems A comprehensive review of systems was negative.   Objective:    BP 116/75   Pulse 84   Ht 4\' 11"  (1.499 m)   Wt 124 lb (56.2 kg)   SpO2 100%   BMI 25.04 kg/m  General appearance: alert, cooperative and appears stated age Head: Normocephalic, without obvious abnormality, atraumatic Eyes: conjunctivae/corneas clear. PERRL, EOM's intact. Fundi benign. Ears: normal TM's and external ear canals both ears Nose: Nares normal. Septum midline. Mucosa normal. No drainage or sinus tenderness. Throat: lips, mucosa, and tongue normal; teeth and gums normal Neck: no adenopathy, no carotid bruit, no JVD, supple, symmetrical, trachea midline and thyroid not enlarged, symmetric, no tenderness/mass/nodules Back: symmetric, no curvature. ROM normal. No CVA tenderness. Lungs: clear to auscultation bilaterally Breasts: normal appearance, no masses or tenderness Heart: regular rate and rhythm, S1, S2 normal, no  murmur, click, rub or gallop Abdomen: soft, non-tender; bowel sounds normal; no masses,  no organomegaly Pelvic: cervix normal in appearance, external genitalia normal, no adnexal masses or tenderness, no cervical motion tenderness, uterus normal size, shape, and consistency and vagina normal without discharge IUD strings visulized. Extremities: extremities normal, atraumatic, no cyanosis or edema Pulses: 2+ and symmetric Skin: Skin color, texture, turgor normal. No rashes or lesions Lymph nodes: Cervical, supraclavicular, and axillary nodes normal. Neurologic: Alert and oriented X 3, normal strength and tone. Normal  symmetric reflexes. Normal coordination and gait    Assessment:    Healthy female exam.      Plan:    Marland KitchenMarland KitchenMaylee was seen today for annual exam.  Diagnoses and all orders for this visit:  Routine physical examination -     Lipid Panel w/reflex Direct LDL -     TSH -     COMPLETE METABOLIC PANEL WITH GFR  Attention deficit hyperactivity disorder (ADHD), predominantly hyperactive type -     amphetamine-dextroamphetamine (ADDERALL) 30 MG tablet; Take 1 tablet by mouth 2 (two) times daily. -     amphetamine-dextroamphetamine (ADDERALL) 30 MG tablet; Take 1 tablet by mouth 2 (two) times daily. -     amphetamine-dextroamphetamine (ADDERALL) 30 MG tablet; Take 1 tablet by mouth 2 (two) times daily.  Migraine without status migrainosus, not intractable, unspecified migraine type -     rizatriptan (MAXALT-MLT) 10 MG disintegrating tablet; Take 1 tablet (10 mg total) by mouth as needed for migraine. May repeat in 2 hours if needed  Migraine without aura and without status migrainosus, not intractable -     rizatriptan (MAXALT-MLT) 10 MG disintegrating tablet; Take 1 tablet (10 mg total) by mouth as needed for migraine. May repeat in 2 hours if needed  Routine Papanicolaou smear -     Cytology - PAP  Screening for lipid disorders -     Lipid Panel w/reflex Direct LDL  Screening for diabetes mellitus -     COMPLETE METABOLIC PANEL WITH GFR  Screening for thyroid disorder -     TSH  Seasonal allergies -     fexofenadine (ALLEGRA ALLERGY) 180 MG tablet; Take 1 tablet (180 mg total) by mouth daily. -     fluticasone (FLONASE) 50 MCG/ACT nasal spray; Place 1 spray into both nostrils daily.  Screening examination for STD (sexually transmitted disease) -     Cytology - PAP   .[name] Discussed 150 minutes of exercise a week.  Encouraged vitamin D 1000 units and Calcium 1300mg  or 4 servings of dairy a day.  Fasting labs ordered today.  Pap done today. STD screen ordered.   ADHD-refilled  meds for 3 months.  Migraines-refilled rescue.  See After Visit Summary for Counseling Recommendations

## 2020-03-28 LAB — CYTOLOGY - PAP
Chlamydia: POSITIVE — AB
Comment: NEGATIVE
Comment: NEGATIVE
Comment: NORMAL
Diagnosis: NEGATIVE
High risk HPV: NEGATIVE
Neisseria Gonorrhea: NEGATIVE

## 2020-03-28 LAB — COMPLETE METABOLIC PANEL WITH GFR
AG Ratio: 1.8 (calc) (ref 1.0–2.5)
ALT: 15 U/L (ref 6–29)
AST: 19 U/L (ref 10–30)
Albumin: 4.4 g/dL (ref 3.6–5.1)
Alkaline phosphatase (APISO): 46 U/L (ref 31–125)
BUN: 10 mg/dL (ref 7–25)
CO2: 26 mmol/L (ref 20–32)
Calcium: 9.1 mg/dL (ref 8.6–10.2)
Chloride: 104 mmol/L (ref 98–110)
Creat: 0.73 mg/dL (ref 0.50–1.10)
GFR, Est African American: 127 mL/min/{1.73_m2} (ref >=60)
GFR, Est Non African American: 110 mL/min/{1.73_m2} (ref >=60)
Globulin: 2.4 g/dL (calc) (ref 1.9–3.7)
Glucose, Bld: 90 mg/dL (ref 65–99)
Potassium: 3.9 mmol/L (ref 3.5–5.3)
Sodium: 138 mmol/L (ref 135–146)
Total Bilirubin: 0.7 mg/dL (ref 0.2–1.2)
Total Protein: 6.8 g/dL (ref 6.1–8.1)

## 2020-03-28 LAB — LIPID PANEL W/REFLEX DIRECT LDL
Cholesterol: 201 mg/dL — ABNORMAL HIGH (ref <200)
HDL: 92 mg/dL (ref >=50)
LDL Cholesterol (Calc): 96 mg/dL (calc)
Non-HDL Cholesterol (Calc): 109 mg/dL (calc) (ref <130)
Total CHOL/HDL Ratio: 2.2 (calc) (ref <5.0)
Triglycerides: 50 mg/dL (ref <150)

## 2020-03-28 LAB — TSH: TSH: 1.04 mIU/L

## 2020-03-28 NOTE — Progress Notes (Signed)
Victoria Barnes,   Your HDL is AMAZING! TG and LDL to goal. Thyroid perfect. Kidney, liver, glucose look great.   Wonderful Labs.   Lesly Rubenstein

## 2020-03-30 ENCOUNTER — Other Ambulatory Visit: Payer: Self-pay | Admitting: Physician Assistant

## 2020-03-30 MED ORDER — AZITHROMYCIN 250 MG PO TABS: ORAL_TABLET | ORAL | 0 refills | Status: DC

## 2020-03-30 MED FILL — AZITHROMYCIN 250 MG TABS: 250 | 1 days supply | Qty: 4 | Fill #0

## 2020-03-30 NOTE — Progress Notes (Signed)
Victoria Barnes,   Pap normal. No abnormal cells. Negative HPV. No gonorrhea but chlamydia was detected. I am going to send over azithromycin 1g once to treat. Do not have sex for 7 days after treatment. Please get partner treated. We can test for clearance in one month.

## 2020-03-31 MED FILL — CLONAZEPAM 1 MG TABS: 1 | 30 days supply | Qty: 30 | Fill #1

## 2020-04-02 ENCOUNTER — Encounter: Payer: Self-pay | Admitting: Physician Assistant

## 2020-04-03 MED ORDER — FLUCONAZOLE 150 MG PO TABS: 150.0000 mg | ORAL_TABLET | Freq: Once | ORAL | 0 refills | Status: AC

## 2020-04-03 MED FILL — FLUCONAZOLE 150 MG TABS: 150 | 2 days supply | Qty: 2 | Fill #0

## 2020-04-16 MED FILL — FLUCONAZOLE 150 MG TABS: 150 | 2 days supply | Qty: 2 | Fill #0

## 2020-04-24 ENCOUNTER — Encounter: Payer: Self-pay | Admitting: Nurse Practitioner

## 2020-04-24 ENCOUNTER — Ambulatory Visit (INDEPENDENT_AMBULATORY_CARE_PROVIDER_SITE_OTHER): Payer: 59 | Admitting: Nurse Practitioner

## 2020-04-24 VITALS — BP 124/75 | HR 105 | Temp 98.4°F | Ht 59.0 in | Wt 125.0 lb

## 2020-04-24 DIAGNOSIS — N898 Other specified noninflammatory disorders of vagina: Secondary | ICD-10-CM

## 2020-04-24 MED ORDER — FLUCONAZOLE 150 MG PO TABS: 150.0000 mg | ORAL_TABLET | Freq: Every day | ORAL | 1 refills | Status: DC

## 2020-04-24 NOTE — Progress Notes (Signed)
Acute Office Visit  Subjective:    Patient ID: Victoria Barnes, female    DOB: 30-May-1986, 34 y.o.   MRN: [id number]  Chief Complaint  Patient presents with  . Exposure to STD    HPI Patient is in today for repeat testing for STI infection after treatment and evaluation for vaginal itching.  She was seen in May of this year and treated for vaginal chlamydial infection with azithromycin 1g treatment once. Her first diagnosis was incidental during routine annual exam pap smear. She reports that she started experiencing vaginal itching and burning after treatment and was treated with single dose of oral fluconazole. Since that time she has still been experiencing vaginal itching and irritation and would like to make sure that the chlamydia infection has no longer present and she would also like to be tested for trichomoniasis, bacterial vaginosis, and yeast.   She denies vaginal discharge, vaginal odor, urinary symptoms, fever, chills, nausea, vomiting, or abdominal pain.   Past Medical History:  Diagnosis Date  . ADHD   . Bipolar I disorder (HCC)   . Depression   . Eczema 2012  . Migraine without status migrainosus, not intractable 02/26/2016    Past Surgical History:  Procedure Laterality Date  . BREAST ENHANCEMENT SURGERY Bilateral June 2015  . WISDOM TOOTH EXTRACTION      Family History  Problem Relation Age of Onset  . Hyperlipidemia Mother   . Personality disorder Maternal Grandmother   . Depression Maternal Grandmother   . Mood Disorder Maternal Grandmother     Social History   Socioeconomic History  . Marital status: Single    Spouse name: Not on file  . Number of children: Not on file  . Years of education: Not on file  . Highest education level: Not on file  Occupational History  . Occupation: CMA  Tobacco Use  . Smoking status: Former Smoker    Types: Cigarettes    Quit date: 11/2014    Years since quitting: 5.4  . Smokeless tobacco: Never Used  . Tobacco  comment: quit 2 years  Vaping Use  . Vaping Use: Never used  Substance and Sexual Activity  . Alcohol use: Yes    Comment: once or twice a month - socially  . Drug use: No  . Sexual activity: Yes    Partners: Male    Birth control/protection: I.U.D.    Comment: condoms  Other Topics Concern  . Not on file  Social History Narrative  . Not on file   Social Determinants of Health   Financial Resource Strain:   . Difficulty of Paying Living Expenses:   Food Insecurity:   . Worried About Programme researcher, broadcasting/film/video in the Last Year:   . Barista in the Last Year:   Transportation Needs:   . Freight forwarder (Medical):   [name] Lack of Transportation (Non-Medical):   Physical Activity:   . Days of Exercise per Week:   . Minutes of Exercise per Session:   Stress:   . Feeling of Stress :   Social Connections:   . Frequency of Communication with Friends and Family:   . Frequency of Social Gatherings with Friends and Family:   . Attends Religious Services:   . Active Member of Clubs or Organizations:   . Attends Banker Meetings:   [name] Marital Status:   Intimate Partner Violence:   . Fear of Current or Ex-Partner:   . Emotionally Abused:   .  Physically Abused:   . Sexually Abused:     Outpatient Medications Prior to Visit  Medication Sig Dispense Refill  . amphetamine-dextroamphetamine (ADDERALL) 30 MG tablet Take 1 tablet by mouth 2 (two) times daily. 60 tablet 0  . [START ON 04/26/2020] amphetamine-dextroamphetamine (ADDERALL) 30 MG tablet Take 1 tablet by mouth 2 (two) times daily. 60 tablet 0  . [START ON 04/25/2020] amphetamine-dextroamphetamine (ADDERALL) 30 MG tablet Take 1 tablet by mouth 2 (two) times daily. 60 tablet 0  . azithromycin (ZITHROMAX Z-PAK) 250 MG tablet Take 4 tablets once. 4 tablet 0  . cholecalciferol (VITAMIN D3) 25 MCG (1000 UNIT) tablet Take 1,000 Units by mouth daily.    . clobetasol cream (TEMOVATE) 9.50 % Apply 1 application topically  2 (two) times daily. 60 g 1  . clonazePAM (KLONOPIN) 1 MG tablet Take 1 tablet (1 mg total) by mouth daily. As needed for acute anxiety and panic. 30 tablet 1  . fexofenadine (ALLEGRA ALLERGY) 180 MG tablet Take 1 tablet (180 mg total) by mouth daily. 90 tablet 3  . fluticasone (FLONASE) 50 MCG/ACT nasal spray Place 1 spray into both nostrils daily. 15.8 g 3  . Ginkgo Biloba 40 MG TABS Take by mouth.    . rizatriptan (MAXALT-MLT) 10 MG disintegrating tablet Take 1 tablet (10 mg total) by mouth as needed for migraine. May repeat in 2 hours if needed 10 tablet 5   No facility-administered medications prior to visit.    Allergies  Allergen Reactions  . Lamictal [Lamotrigine] Rash    Possible SJS  . Antacid [Alum & Mag Hydroxide-Simeth] Nausea And Vomiting  . Keflex [Cephalexin] Nausea And Vomiting  . Penicillins Nausea And Vomiting  . Trintellix [Vortioxetine]     Swelling/numbness and tingling of extremities.   Arman Filter Hcl]     Abnormal Frightening Dreams    Review of Systems  Constitutional: Negative for activity change, appetite change, chills, fatigue, fever and unexpected weight change.  Gastrointestinal: Negative for abdominal distention, abdominal pain, constipation, diarrhea, nausea and vomiting.  Genitourinary: Negative for difficulty urinating, dysuria, flank pain, frequency, genital sores, hematuria, menstrual problem, pelvic pain, urgency, vaginal bleeding, vaginal discharge and vaginal pain.  Neurological: Negative for weakness and headaches.       Objective:    Physical Exam Vitals and nursing note reviewed.  Constitutional:      Appearance: Normal appearance. She is normal weight.  HENT:     Head: Normocephalic.  Eyes:     Pupils: Pupils are equal, round, and reactive to light.  Cardiovascular:     Rate and Rhythm: Normal rate.  Pulmonary:     Effort: Pulmonary effort is normal.  Musculoskeletal:        General: Normal range of motion.      Cervical back: Normal range of motion.  Skin:    General: Skin is warm and dry.     Capillary Refill: Capillary refill takes less than 2 seconds.  Neurological:     General: No focal deficit present.     Mental Status: She is alert and oriented to person, place, and time.  Psychiatric:        Mood and Affect: Mood normal.        Thought Content: Thought content normal.        Judgment: Judgment normal.     BP 124/75   Pulse (!) 105   Temp 98.4 F (36.9 C) (Oral)   Ht 4\' 11"  (1.499 m)   Wt 125  lb (56.7 kg)   BMI 25.25 kg/m  Wt Readings from Last 3 Encounters:  04/24/20 125 lb (56.7 kg)  03/27/20 124 lb (56.2 kg)  12/13/19 113 lb (51.3 kg)    Health Maintenance Due  Topic Date Due  . COVID-19 Vaccine (1) Never done    There are no preventive care reminders to display for this patient.   Lab Results  Component Value Date   TSH 1.04 03/27/2020   Lab Results  Component Value Date   WBC 6.1 12/13/2019   HGB 12.2 12/13/2019   HCT 37.2 12/13/2019   MCV 91.0 12/13/2019   PLT 210 12/13/2019   Lab Results  Component Value Date   NA 138 03/27/2020   K 3.9 03/27/2020   CO2 26 03/27/2020   GLUCOSE 90 03/27/2020   BUN 10 03/27/2020   CREATININE 0.73 03/27/2020   BILITOT 0.7 03/27/2020   ALKPHOS 52 02/23/2017   AST 19 03/27/2020   ALT 15 03/27/2020   PROT 6.8 03/27/2020   ALBUMIN 4.2 02/23/2017   CALCIUM 9.1 03/27/2020   Lab Results  Component Value Date   CHOL 201 (H) 03/27/2020   Lab Results  Component Value Date   HDL 92 03/27/2020   Lab Results  Component Value Date   LDLCALC 96 03/27/2020   Lab Results  Component Value Date   TRIG 50 03/27/2020   Lab Results  Component Value Date   CHOLHDL 2.2 03/27/2020   No results found for: HGBA1C     Assessment & Plan:   1. Vaginal itching Vaginal itching in the setting of recent antibiotic therapy for chlamydia vaginal infection. Will test today to ensure that infection was eliminated with  antibiotic therapy and to ensure that no other vaginal infections are currently present. Will treat empirically with fluconazole orally for suspected yeast infection due to antibiotic use.   PLAN: -Urine sent for chlamydia and gonorrhea -Wet prep sent for trich, yeast, clue cells -Treat suspected yeast empirically with oral fluconazole  -Will notify patient of results of testing and make changes to the plan of care based on results.  -Follow-up if symptoms worsen or fail to improve.   - fluconazole (DIFLUCAN) 150 MG tablet; Take 1 tablet (150 mg total) by mouth daily.  Dispense: 1 tablet; Refill: 1 - C. trachomatis/N. gonorrhoeae RNA - WET PREP FOR TRICH, YEAST, CLUE   Tollie Eth, NP

## 2020-04-24 NOTE — Patient Instructions (Signed)
I will let you know the results as soon as we get the lab results back.   If you begin to have new or worsening symptoms before you hear from me, please feel free to reach out to me through [place] with a message.

## 2020-04-25 LAB — WET PREP FOR TRICH, YEAST, CLUE
MICRO NUMBER:: 10619600
Specimen Quality: ADEQUATE

## 2020-04-25 LAB — C. TRACHOMATIS/N. GONORRHOEAE RNA
C. trachomatis RNA, TMA: NOT DETECTED
N. gonorrhoeae RNA, TMA: NOT DETECTED

## 2020-04-25 NOTE — Progress Notes (Signed)
Tests were negative for chlamydia and gonorrhea! It did not show the presence of yeast, trich, or BV on the vaginal swab. If you still have symptoms after the treatment for yeast, we can consider prophylactic treatment for BV. In my experience, sometimes the clue cells are not always seen.

## 2020-04-27 MED FILL — FLUCONAZOLE 150 MG TABS: 150 | 2 days supply | Qty: 2 | Fill #0

## 2020-04-27 MED FILL — AMPHETAMINE-DEXTROAMPHETAMI: 30 | 60 days supply | Qty: 60 | Fill #0

## 2020-05-20 MED FILL — RIZATRIPTAN 10 MG ODT: 10 | 5 days supply | Qty: 10 | Fill #1

## 2020-05-20 MED FILL — FLUTICASONE PROP 50 MCG SPR: 50 | 60 days supply | Qty: 16 | Fill #1

## 2020-05-30 MED FILL — AMPHETAMINE-DEXTROAMPHETAMI: 30 | 30 days supply | Qty: 60 | Fill #0

## 2020-10-05 ENCOUNTER — Telehealth (INDEPENDENT_AMBULATORY_CARE_PROVIDER_SITE_OTHER): Payer: Medicaid Other | Admitting: Physician Assistant

## 2020-10-05 ENCOUNTER — Encounter: Payer: Self-pay | Admitting: Physician Assistant

## 2020-10-05 ENCOUNTER — Other Ambulatory Visit: Payer: Self-pay | Admitting: Physician Assistant

## 2020-10-05 ENCOUNTER — Other Ambulatory Visit: Payer: Self-pay

## 2020-10-05 DIAGNOSIS — F901 Attention-deficit hyperactivity disorder, predominantly hyperactive type: Secondary | ICD-10-CM

## 2020-10-05 DIAGNOSIS — G43909 Migraine, unspecified, not intractable, without status migrainosus: Secondary | ICD-10-CM | POA: Diagnosis not present

## 2020-10-05 DIAGNOSIS — G43009 Migraine without aura, not intractable, without status migrainosus: Secondary | ICD-10-CM

## 2020-10-05 DIAGNOSIS — J302 Other seasonal allergic rhinitis: Secondary | ICD-10-CM | POA: Diagnosis not present

## 2020-10-05 MED ORDER — AMPHETAMINE-DEXTROAMPHETAMINE 30 MG PO TABS: 1.0000 | ORAL_TABLET | Freq: Two times a day (BID) | ORAL | 0 refills | Status: DC

## 2020-10-05 MED ORDER — AMPHETAMINE-DEXTROAMPHETAMINE 30 MG PO TABS: 30.0000 mg | ORAL_TABLET | Freq: Two times a day (BID) | ORAL | 0 refills | Status: DC

## 2020-10-05 MED ORDER — FEXOFENADINE HCL 180 MG PO TABS: 180.0000 mg | ORAL_TABLET | Freq: Every day | ORAL | 3 refills | Status: DC

## 2020-10-05 MED ORDER — FLUTICASONE PROPIONATE 50 MCG/ACT NA SUSP: 1.0000 | Freq: Every day | NASAL | 3 refills | Status: DC

## 2020-10-05 MED ORDER — RIZATRIPTAN BENZOATE 10 MG PO TBDP: 10.0000 mg | ORAL_TABLET | ORAL | 5 refills | Status: DC | PRN

## 2020-10-05 MED FILL — AMPHETAMINE-DEXTROAMPHETAMI: 30 | 30 days supply | Qty: 60 | Fill #0

## 2020-10-05 MED FILL — RIZATRIPTAN 10 MG ODT: 10 | 30 days supply | Qty: 10 | Fill #0

## 2020-10-05 MED FILL — FLUTICASONE PROP 50 MCG SPR: 50 | 30 days supply | Qty: 16 | Fill #0

## 2020-10-05 NOTE — Progress Notes (Signed)
Patient ID: Victoria Barnes, female   DOB: Nov 02, 1986, 34 y.o.   MRN: [id number] .Victoria KitchenVirtual Visit via Telephone Note  I connected with TRENELL MOXEY on 10/05/2020 at  2:20 PM EST by telephone and verified that I am speaking with the correct person using two identifiers.  Location: Patient: home Provider: clinic  .Victoria KitchenParticipating in visit:  Patient: [name] Provider: Tandy Gaw PA-C Provider in training: Cyndi Lennert PA-Student    I discussed the limitations, risks, security and privacy concerns of performing an evaluation and management service by telephone and the availability of in person appointments. I also discussed with the patient that there may be a patient responsible charge related to this service. The patient expressed understanding and agreed to proceed.   History of Present Illness: Pt is a 34 yo female who calls into the clinic for ADHD refills. She is doing well. She has not been taking the regularly but more as she needs them. She notices a difference in productivity and focus when she does not take them. She denies any SE of increase anxiety, palpitations, headaches. Overall she reports her mood is well controlled.   Migraines well controlled. As needed maxalt needs refills. 1-3 a month.   Seasonal allergies needs refills.   .. Active Ambulatory Problems    Diagnosis Date Noted  . Eczema 07/17/2015  . 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 Attention deficit hyperactivity disorder (ADHD) 01/08/2016  . Migraine without status migrainosus, not intractable 02/26/2016  . 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 Paresthesias 09/10/2016  . Fatigue 02/23/2017  . Bipolar 2 disorder (HCC) 07/07/2017  . Dehydration 12/16/2017  . Seasonal allergies 03/14/2018  . Anxiety 09/01/2018  . Acute stress reaction 02/07/2019  . Tachycardia 05/02/2019  . Alopecia 05/02/2019   Resolved Ambulatory Problems    Diagnosis Date  Noted  . Vitamin D deficiency 06/20/2015  . Depression 07/17/2015  . Abnormal weight gain 07/19/2015  . Pain in joint, ankle and foot 08/02/2015  . Exposure to strep throat 09/07/2015  . Viral gastroenteritis 09/13/2015  . Abscess of skin 09/13/2015  . Pharyngitis 11/06/2015  . Migraine without aura and with status migrainosus, not intractable 05/22/2016  . Insomnia 05/22/2016  . Nausea without vomiting 05/24/2016  . 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 Dehydration 08/06/2016  . Episodic mood disorder (HCC) 11/11/2016  . Influenza-like illness 12/11/2016  . Side effects of treatment 02/23/2017   Past Medical History:  Diagnosis Date  . ADHD   . Bipolar I disorder (HCC)    Reviewed med, allergy, problem list.     Observations/Objective: No acute distress Normal breathing.   .. Today's Vitals   10/05/20 1315  BP: 117/86  Weight: 125 lb (56.7 kg)  Height: 4\' 11"  (1.499 m)   Body mass index is 25.25 kg/m.    Assessment and Plan: Victoria KitchenKalimah was seen today for adhd.  Diagnoses and all orders for this visit:  Attention deficit hyperactivity disorder (ADHD), predominantly hyperactive type -     amphetamine-dextroamphetamine (ADDERALL) 30 MG tablet; Take 1 tablet by mouth 2 (two) times daily. -     amphetamine-dextroamphetamine (ADDERALL) 30 MG tablet; Take 1 tablet by mouth 2 (two) times daily. -     amphetamine-dextroamphetamine (ADDERALL) 30 MG tablet; Take 1 tablet by mouth 2 (two) times daily.  Migraine without status migrainosus, not intractable, unspecified migraine type -     rizatriptan (MAXALT-MLT) 10 MG disintegrating tablet; Take  1 tablet (10 mg total) by mouth as needed for migraine. May repeat in 2 hours if needed  Migraine without aura and without status migrainosus, not intractable -     rizatriptan (MAXALT-MLT) 10 MG disintegrating tablet; Take 1 tablet (10 mg total) by mouth as needed for migraine. May repeat in  2 hours if needed  Seasonal allergies -     fexofenadine (ALLEGRA ALLERGY) 180 MG tablet; Take 1 tablet (180 mg total) by mouth daily. -     fluticasone (FLONASE) 50 MCG/ACT nasal spray; Place 1 spray into both nostrils daily.   Medications refilled.  Follow up in 6 months.    Follow Up Instructions:    I discussed the assessment and treatment plan with the patient. The patient was provided an opportunity to ask questions and all were answered. The patient agreed with the plan and demonstrated an understanding of the instructions.   The patient was advised to call back or seek an in-person evaluation if the symptoms worsen or if the condition fails to improve as anticipated.  I provided 10 minutes of non-face-to-face time during this encounter.   Tandy Gaw, PA-C

## 2020-10-05 NOTE — Progress Notes (Signed)
Needing refill of Adderall. No other issues

## 2020-11-22 ENCOUNTER — Other Ambulatory Visit: Payer: Self-pay

## 2020-11-22 ENCOUNTER — Ambulatory Visit
Admission: EM | Admit: 2020-11-22 | Discharge: 2020-11-22 | Disposition: A | Discharge status: Home / Self Care | Payer: Medicaid Other | Attending: Emergency Medicine | Admitting: Emergency Medicine

## 2020-11-22 ENCOUNTER — Ambulatory Visit: Payer: Self-pay

## 2020-11-22 DIAGNOSIS — Z7251 High risk heterosexual behavior: Secondary | ICD-10-CM

## 2020-11-22 DIAGNOSIS — Z8619 Personal history of other infectious and parasitic diseases: Secondary | ICD-10-CM

## 2020-11-22 DIAGNOSIS — N898 Other specified noninflammatory disorders of vagina: Secondary | ICD-10-CM | POA: Diagnosis not present

## 2020-11-22 DIAGNOSIS — N760 Acute vaginitis: Secondary | ICD-10-CM | POA: Diagnosis not present

## 2020-11-22 LAB — POCT URINE PREGNANCY: Preg Test, Ur: NEGATIVE

## 2020-11-22 MED ORDER — METRONIDAZOLE 500 MG PO TABS
2000.0000 mg | ORAL_TABLET | Freq: Once | ORAL | Status: AC
  Administered 2020-11-22: 2000 mg via ORAL

## 2020-11-22 NOTE — Discharge Instructions (Signed)
Today you received treatment for trichomonas. °Testing for chlamydia, gonorrhea, trichomonas is pending: please look for these results on the MyChart app/website.  We will notify you if you are positive and outline treatment at that time. ° °Important to avoid all forms of sexual intercourse (oral, vaginal, anal) with any/all partners for the next 7 days to avoid spreading/reinfecting. °Any/all sexual partners should be notified of testing/treatment today. ° °Return for persistent/worsening symptoms or if you develop fever, abdominal or pelvic pain, discharge, genital pain, blood in your urine, or are re-exposed to an STI. °

## 2020-11-22 NOTE — ED Triage Notes (Signed)
Pt requesting STD testing. States her BF has been up to no good. States having vaginal discharge, irritation, itching, and odor x1 wk or more.

## 2020-11-22 NOTE — ED Provider Notes (Signed)
[facility]    CSN: [id number] Arrival date & time: 11/22/20  1149      History   Chief Complaint Chief Complaint  Patient presents with  . appt 12- STD    HPI Victoria Barnes is a 35 y.o. female  With history as below presenting for STI testing.  States that she found out her boyfriend has had other partners.  Has had a weeklong course of vaginal discharge, irritation, malodor.  States that she has had trichomonas in the past, this feels similar.  No pelvic pain, urinary symptoms.  Past Medical History:  Diagnosis Date  . ADHD   . Bipolar I disorder (HCC)   . Depression   . Eczema 2012  . Migraine without status migrainosus, not intractable 02/26/2016    Patient Active Problem List   Diagnosis Date Noted  . Tachycardia 05/02/2019  . Alopecia 05/02/2019  . Acute stress reaction 02/07/2019  . Anxiety 09/01/2018  . Seasonal allergies 03/14/2018  . Dehydration 12/16/2017  . Bipolar 2 disorder (HCC) 07/07/2017  . Fatigue 02/23/2017  . Paresthesias 09/10/2016  . 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 Migraine without status migrainosus, not intractable 02/26/2016  . Attention deficit hyperactivity disorder (ADHD) 01/08/2016  . 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 Eczema 07/17/2015    Past Surgical History:  Procedure Laterality Date  . BREAST ENHANCEMENT SURGERY Bilateral June 2015  . WISDOM TOOTH EXTRACTION      OB History    Gravida  3   Para  1   Term      Preterm  1   AB      Living  1     SAB      IAB      Ectopic      Multiple      Live Births               Home Medications    Prior to Admission medications   Medication Sig Start Date End Date Taking? Authorizing Provider  amphetamine-dextroamphetamine (ADDERALL) 30 MG tablet Take 1 tablet by mouth 2 (two) times daily. 10/05/20   Breeback, Jade L, PA-C  amphetamine-dextroamphetamine (ADDERALL) 30 MG tablet Take 1  tablet by mouth 2 (two) times daily. 11/03/20   Breeback, Jade L, PA-C  amphetamine-dextroamphetamine (ADDERALL) 30 MG tablet Take 1 tablet by mouth 2 (two) times daily. 12/03/20   Breeback, Lonna Cobb, PA-C  cholecalciferol (VITAMIN D3) 25 MCG (1000 UNIT) tablet Take 1,000 Units by mouth daily.    [provider]  clobetasol cream (TEMOVATE) 0.05 % Apply 1 application topically 2 (two) times daily. 04/26/19   Breeback, Jade L, PA-C  clonazePAM (KLONOPIN) 1 MG tablet Take 1 tablet (1 mg total) by mouth daily. As needed for acute anxiety and panic. 12/13/19   Breeback, Lonna Cobb, PA-C  fexofenadine (ALLEGRA ALLERGY) 180 MG tablet Take 1 tablet (180 mg total) by mouth daily. 10/05/20   Breeback, Jade L, PA-C  fluticasone (FLONASE) 50 MCG/ACT nasal spray Place 1 spray into both nostrils daily. 10/05/20   Breeback, Jade L, PA-C  Ginkgo Biloba 40 MG TABS Take by mouth.    [provider]  rizatriptan (MAXALT-MLT) 10 MG disintegrating tablet Take 1 tablet (10 mg total) by mouth as needed for migraine. May repeat in 2 hours if needed 10/05/20   Jomarie Longs, PA-C    Family History Family History  Problem Relation Age of Onset  .  Hyperlipidemia Mother   . Personality disorder Maternal Grandmother   . Depression Maternal Grandmother   . Mood Disorder Maternal Grandmother     Social History Social History   Tobacco Use  . Smoking status: Former Smoker    Types: Cigarettes    Quit date: 11/2014    Years since quitting: 6.0  . Smokeless tobacco: Never Used  . Tobacco comment: quit 2 years  Vaping Use  . Vaping Use: Never used  Substance Use Topics  . Alcohol use: Yes    Comment: once or twice a month - socially  . Drug use: No     Allergies   Lamictal [lamotrigine], Antacid [alum & mag hydroxide-simeth], Keflex [cephalexin], Penicillins, Trintellix [vortioxetine], and Viibryd [vilazodone hcl]   Review of Systems Review of Systems  Constitutional: Negative for fatigue and fever.   Respiratory: Negative for cough and shortness of breath.   Cardiovascular: Negative for chest pain and palpitations.  Gastrointestinal: Negative for constipation and diarrhea.  Genitourinary: Positive for vaginal discharge. Negative for dysuria, flank pain, frequency, hematuria, pelvic pain, urgency, vaginal bleeding and vaginal pain.     Physical Exam Triage Vital Signs ED Triage Vitals  Enc Vitals Group     BP 11/22/20 1210 105/70     Pulse Rate 11/22/20 1210 65     Resp 11/22/20 1210 18     Temp 11/22/20 1210 98.3 F (36.8 C)     Temp src --      SpO2 11/22/20 1210 98 %     Weight --      Height --      Head Circumference --      Peak Flow --      Pain Score 11/22/20 1217 0     Pain Loc --      Pain Edu? --      Excl. in GC? --    No data found.  Updated Vital Signs BP 105/70   Pulse 65   Temp 98.3 F (36.8 C)   Resp 18   LMP 11/10/2020   SpO2 98%   Visual Acuity Right Eye Distance:   Left Eye Distance:   Bilateral Distance:    Right Eye Near:   Left Eye Near:    Bilateral Near:     Physical Exam Constitutional:      General: She is not in acute distress. HENT:     Head: Normocephalic and atraumatic.  Eyes:     General: No scleral icterus.    Pupils: Pupils are equal, round, and reactive to light.  Cardiovascular:     Rate and Rhythm: Normal rate.  Pulmonary:     Effort: Pulmonary effort is normal.  Abdominal:     General: Bowel sounds are normal.     Palpations: Abdomen is soft.     Tenderness: There is no abdominal tenderness. There is no right CVA tenderness, left CVA tenderness or guarding.  Genitourinary:    Comments: Patient declined, self-swab performed Skin:    Coloration: Skin is not jaundiced or pale.  Neurological:     Mental Status: She is alert and oriented to person, place, and time.      UC Treatments / Results  Labs (all labs ordered are listed, but only abnormal results are displayed) Labs Reviewed  POCT URINE  PREGNANCY  CERVICOVAGINAL ANCILLARY ONLY    EKG   Radiology No results found.  Procedures Procedures (including critical care time)  Medications Ordered in UC Medications  metroNIDAZOLE (FLAGYL) tablet  2,000 mg (2,000 mg Oral Given 11/22/20 1246)    Initial Impression / Assessment and Plan / UC Course  I have reviewed the triage vital signs and the nursing notes.  Pertinent labs & imaging results that were available during my care of the patient were reviewed by me and considered in my medical decision making (see chart for details).     Patient appears well in office today.  Cytology pending: We will treat empirically for trichomonas given patient's history thereof.  Return precautions discussed, pt verbalized understanding and is agreeable to plan. Final Clinical Impressions(s) / UC Diagnoses   Final diagnoses:  Acute vaginitis  Vaginal discharge  History of trichomoniasis  Unprotected sex     Discharge Instructions     Today you received treatment for trichomonas. Testing for chlamydia, gonorrhea, trichomonas is pending: please look for these results on the MyChart app/website.  We will notify you if you are positive and outline treatment at that time.  Important to avoid all forms of sexual intercourse (oral, vaginal, anal) with any/all partners for the next 7 days to avoid spreading/reinfecting. Any/all sexual partners should be notified of testing/treatment today.  Return for persistent/worsening symptoms or if you develop fever, abdominal or pelvic pain, discharge, genital pain, blood in your urine, or are re-exposed to an STI.    ED Prescriptions    None     PDMP not reviewed this encounter.   Hall[place]Potvin, [place], [place] 11/22/20 1358

## 2020-11-23 ENCOUNTER — Other Ambulatory Visit (HOSPITAL_COMMUNITY): Payer: Self-pay | Admitting: Internal Medicine

## 2020-11-23 ENCOUNTER — Telehealth (HOSPITAL_COMMUNITY): Payer: Self-pay | Admitting: Emergency Medicine

## 2020-11-23 LAB — CERVICOVAGINAL ANCILLARY ONLY
Bacterial Vaginitis (gardnerella): POSITIVE — AB
Candida Glabrata: NEGATIVE
Candida Vaginitis: POSITIVE — AB
Chlamydia: NEGATIVE
Comment: NEGATIVE
Comment: NEGATIVE
Comment: NEGATIVE
Comment: NEGATIVE
Comment: NEGATIVE
Comment: NORMAL
Neisseria Gonorrhea: NEGATIVE
Trichomonas: NEGATIVE

## 2020-11-23 MED ORDER — FLUCONAZOLE 150 MG PO TABS: 150.0000 mg | ORAL_TABLET | Freq: Once | ORAL | 0 refills | Status: DC

## 2020-11-23 MED FILL — FLUCONAZOLE 150 MG TABS: 150 | 3 days supply | Qty: 2 | Fill #0

## 2020-11-26 ENCOUNTER — Ambulatory Visit: Payer: Medicaid Other | Admitting: Physician Assistant

## 2020-12-21 MED FILL — AMPHETAMINE-DEXTROAMPHETAMI: 30 | 30 days supply | Qty: 60 | Fill #0

## 2021-01-22 ENCOUNTER — Other Ambulatory Visit (HOSPITAL_BASED_OUTPATIENT_CLINIC_OR_DEPARTMENT_OTHER): Payer: Self-pay

## 2021-06-05 ENCOUNTER — Other Ambulatory Visit (HOSPITAL_COMMUNITY): Payer: Self-pay

## 2021-06-05 ENCOUNTER — Ambulatory Visit (INDEPENDENT_AMBULATORY_CARE_PROVIDER_SITE_OTHER): Payer: Medicaid Other | Admitting: Physician Assistant

## 2021-06-05 ENCOUNTER — Encounter: Payer: Self-pay | Admitting: Physician Assistant

## 2021-06-05 VITALS — BP 122/78 | HR 77 | Temp 98.6°F | Ht 59.0 in | Wt 135.2 lb

## 2021-06-05 DIAGNOSIS — E559 Vitamin D deficiency, unspecified: Secondary | ICD-10-CM | POA: Diagnosis not present

## 2021-06-05 DIAGNOSIS — F909 Attention-deficit hyperactivity disorder, unspecified type: Secondary | ICD-10-CM

## 2021-06-05 DIAGNOSIS — F901 Attention-deficit hyperactivity disorder, predominantly hyperactive type: Secondary | ICD-10-CM | POA: Diagnosis not present

## 2021-06-05 DIAGNOSIS — G43009 Migraine without aura, not intractable, without status migrainosus: Secondary | ICD-10-CM | POA: Diagnosis not present

## 2021-06-05 DIAGNOSIS — F3181 Bipolar II disorder: Secondary | ICD-10-CM | POA: Diagnosis not present

## 2021-06-05 DIAGNOSIS — Z131 Encounter for screening for diabetes mellitus: Secondary | ICD-10-CM | POA: Diagnosis not present

## 2021-06-05 DIAGNOSIS — G43909 Migraine, unspecified, not intractable, without status migrainosus: Secondary | ICD-10-CM

## 2021-06-05 DIAGNOSIS — L700 Acne vulgaris: Secondary | ICD-10-CM | POA: Diagnosis not present

## 2021-06-05 DIAGNOSIS — Z0000 Encounter for general adult medical examination without abnormal findings: Secondary | ICD-10-CM

## 2021-06-05 DIAGNOSIS — Z13220 Encounter for screening for lipoid disorders: Secondary | ICD-10-CM

## 2021-06-05 DIAGNOSIS — J302 Other seasonal allergic rhinitis: Secondary | ICD-10-CM

## 2021-06-05 DIAGNOSIS — E0789 Other specified disorders of thyroid: Secondary | ICD-10-CM | POA: Diagnosis not present

## 2021-06-05 DIAGNOSIS — Z113 Encounter for screening for infections with a predominantly sexual mode of transmission: Secondary | ICD-10-CM | POA: Diagnosis not present

## 2021-06-05 DIAGNOSIS — B009 Herpesviral infection, unspecified: Secondary | ICD-10-CM

## 2021-06-05 MED ORDER — AMPHETAMINE-DEXTROAMPHETAMINE 30 MG PO TABS
30.0000 mg | ORAL_TABLET | Freq: Two times a day (BID) | ORAL | 0 refills | Status: DC
  Filled 2021-06-05: qty 60, 30d supply, fill #0

## 2021-06-05 MED ORDER — RIZATRIPTAN BENZOATE 10 MG PO TBDP
ORAL_TABLET | ORAL | 5 refills | Status: AC
  Filled 2021-06-05: qty 10, 30d supply, fill #0

## 2021-06-05 MED ORDER — TRETINOIN 0.1 % EX CREA
TOPICAL_CREAM | Freq: Every day | CUTANEOUS | 1 refills | Status: DC
  Filled 2021-06-05: qty 45, 30d supply, fill #0
  Filled 2021-08-05 (×2): qty 45, 30d supply, fill #1

## 2021-06-05 MED ORDER — FLUTICASONE PROPIONATE 50 MCG/ACT NA SUSP
NASAL | 3 refills | Status: DC
  Filled 2021-06-05: qty 16, 60d supply, fill #0
  Filled 2022-01-29: qty 16, 60d supply, fill #1

## 2021-06-05 MED ORDER — AMPHETAMINE-DEXTROAMPHETAMINE 30 MG PO TABS
30.0000 mg | ORAL_TABLET | Freq: Two times a day (BID) | ORAL | 0 refills | Status: DC
  Filled 2021-08-05: qty 60, 30d supply, fill #0

## 2021-06-05 MED ORDER — AMPHETAMINE-DEXTROAMPHETAMINE 30 MG PO TABS: 30.0000 mg | ORAL_TABLET | Freq: Two times a day (BID) | ORAL | 0 refills | Status: DC

## 2021-06-05 NOTE — Patient Instructions (Signed)
Health Maintenance, Female Adopting a healthy lifestyle and getting preventive care are important in promoting health and wellness. Ask your health care provider about: The right schedule for you to have regular tests and exams. Things you can do on your own to prevent diseases and keep yourself healthy. What should I know about diet, weight, and exercise? Eat a healthy diet  Eat a diet that includes plenty of vegetables, fruits, low-fat dairy products, and lean protein. Do not eat a lot of foods that are high in solid fats, added sugars, or sodium.  Maintain a healthy weight Body mass index (BMI) is used to identify weight problems. It estimates body fat based on height and weight. Your health care provider can help determineyour BMI and help you achieve or maintain a healthy weight. Get regular exercise Get regular exercise. This is one of the most important things you can do for your health. Most adults should: Exercise for at least 150 minutes each week. The exercise should increase your heart rate and make you sweat (moderate-intensity exercise). Do strengthening exercises at least twice a week. This is in addition to the moderate-intensity exercise. Spend less time sitting. Even light physical activity can be beneficial. Watch cholesterol and blood lipids Have your blood tested for lipids and cholesterol at 33 years of age, then havethis test every 5 years. Have your cholesterol levels checked more often if: Your lipid or cholesterol levels are high. You are older than 33 years of age. You are at high risk for heart disease. What should I know about cancer screening? Depending on your health history and family history, you may need to have cancer screening at various ages. This may include screening for: Breast cancer. Cervical cancer. Colorectal cancer. Skin cancer. Lung cancer. What should I know about heart disease, diabetes, and high blood pressure? Blood pressure and heart  disease High blood pressure causes heart disease and increases the risk of stroke. This is more likely to develop in people who have high blood pressure readings, are of African descent, or are overweight. Have your blood pressure checked: Every 3-5 years if you are 18-39 years of age. Every year if you are 40 years old or older. Diabetes Have regular diabetes screenings. This checks your fasting blood sugar level. Have the screening done: Once every three years after age 40 if you are at a normal weight and have a low risk for diabetes. More often and at a younger age if you are overweight or have a high risk for diabetes. What should I know about preventing infection? Hepatitis B If you have a higher risk for hepatitis B, you should be screened for this virus. Talk with your health care provider to find out if you are at risk forhepatitis B infection. Hepatitis C Testing is recommended for: Everyone born from 1945 through 1965. Anyone with known risk factors for hepatitis C. Sexually transmitted infections (STIs) Get screened for STIs, including gonorrhea and chlamydia, if: You are sexually active and are younger than 33 years of age. You are older than 33 years of age and your health care provider tells you that you are at risk for this type of infection. Your sexual activity has changed since you were last screened, and you are at increased risk for chlamydia or gonorrhea. Ask your health care provider if you are at risk. Ask your health care provider about whether you are at high risk for HIV. Your health care provider may recommend a prescription medicine to help   prevent HIV infection. If you choose to take medicine to prevent HIV, you should first get tested for HIV. You should then be tested every 3 months for as long as you are taking the medicine. Pregnancy If you are about to stop having your period (premenopausal) and you may become pregnant, seek counseling before you get  pregnant. Take 400 to 800 micrograms (mcg) of folic acid every day if you become pregnant. Ask for birth control (contraception) if you want to prevent pregnancy. Osteoporosis and menopause Osteoporosis is a disease in which the bones lose minerals and strength with aging. This can result in bone fractures. If you are 65 years old or older, or if you are at risk for osteoporosis and fractures, ask your health care provider if you should: Be screened for bone loss. Take a calcium or vitamin D supplement to lower your risk of fractures. Be given hormone replacement therapy (HRT) to treat symptoms of menopause. Follow these instructions at home: Lifestyle Do not use any products that contain nicotine or tobacco, such as cigarettes, e-cigarettes, and chewing tobacco. If you need help quitting, ask your health care provider. Do not use street drugs. Do not share needles. Ask your health care provider for help if you need support or information about quitting drugs. Alcohol use Do not drink alcohol if: Your health care provider tells you not to drink. You are pregnant, may be pregnant, or are planning to become pregnant. If you drink alcohol: Limit how much you use to 0-1 drink a day. Limit intake if you are breastfeeding. Be aware of how much alcohol is in your drink. In the [place] one drink equals one 12 oz bottle of beer (355 mL), one 5 oz glass of wine (148 mL), or one 1 oz glass of hard liquor (44 mL). General instructions Schedule regular health, dental, and eye exams. Stay current with your vaccines. Tell your health care provider if: You often feel depressed. You have ever been abused or do not feel safe at home. Summary Adopting a healthy lifestyle and getting preventive care are important in promoting health and wellness. Follow your health care provider's instructions about healthy diet, exercising, and getting tested or screened for diseases. Follow your health care provider's  instructions on monitoring your cholesterol and blood pressure. This information is not intended to replace advice given to you by your health care provider. Make sure you discuss any questions you have with your healthcare provider. Document Revised: 10/13/2018 Document Reviewed: 10/13/2018 Elsevier Patient Education  2022 [employer]  

## 2021-06-05 NOTE — Progress Notes (Signed)
Subjective:     Victoria Barnes is a 35 y.o. female and is here for a comprehensive physical exam. The patient reports problems - she feels like her neck is a little more puffy than it used to be. No problems swallowing . She is having some acne pop up from time to time and requesting retin-A.    Social History   Socioeconomic History   Marital status: Single    Spouse name: Not on file   Number of children: Not on file   Years of education: Not on file   Highest education level: Not on file  Occupational History   Occupation: CMA  Tobacco Use   Smoking status: Former    Types: Cigarettes    Quit date: 11/2014    Years since quitting: 6.5   Smokeless tobacco: Never   Tobacco comments:    quit 2 years  Vaping Use   Vaping Use: Never used  Substance and Sexual Activity   Alcohol use: Yes    Comment: once or twice a month - socially   Drug use: No   Sexual activity: Yes    Partners: Male    Birth control/protection: None    Comment: condoms  Other Topics Concern   Not on file  Social History Narrative   Not on file   Social Determinants of Health   Financial Resource Strain: Not on file  Food Insecurity: Not on file  Transportation Needs: Not on file  Physical Activity: Not on file  Stress: Not on file  Social Connections: Not on file  Intimate Partner Violence: Not on file   Health Maintenance  Topic Date Due   INFLUENZA VACCINE  06/03/2021   COVID-19 Vaccine (1) 06/21/2021 (Originally 04/16/1991)   PAP SMEAR-Modifier  03/28/2023   TETANUS/TDAP  09/03/2024   Hepatitis C Screening  Completed   HIV Screening  Completed   Pneumococcal Vaccine 56-73 Years old  Aged Out   HPV VACCINES  Aged Out    The following portions of the patient's history were reviewed and updated as appropriate: allergies, current medications, past family history, past medical history, past social history, past surgical history, and problem list.  Review of Systems Pertinent items noted in  HPI and remainder of comprehensive ROS otherwise negative.   Objective:    BP 122/78   Pulse 77   Temp 98.6 F (37 C)   Ht 4\' 11"  (1.499 m) Comment: Pt reported  Wt 135 lb 3.2 oz (61.3 kg)   LMP 05/29/2021   SpO2 100%   BMI 27.31 kg/m  General appearance: alert, cooperative, and appears stated age Head: Normocephalic, without obvious abnormality, atraumatic Eyes: conjunctivae/corneas clear. PERRL, EOM's intact. Fundi benign. Ears: normal TM's and external ear canals both ears Nose: Nares normal. Septum midline. Mucosa normal. No drainage or sinus tenderness. Throat: lips, mucosa, and tongue normal; teeth and gums normal Neck: no adenopathy, no carotid bruit, no JVD, supple, symmetrical, trachea midline, and thyroid: overall thyroid fullness. Back: symmetric, no curvature. ROM normal. No CVA tenderness. Lungs: clear to auscultation bilaterally Breasts: normal appearance, no masses or tenderness Heart: regular rate and rhythm, S1, S2 normal, no murmur, click, rub or gallop Abdomen: soft, non-tender; bowel sounds normal; no masses,  no organomegaly Extremities: extremities normal, atraumatic, no cyanosis or edema Pulses: 2+ and symmetric Skin: Skin color, texture, turgor normal. No rashes or lesions Lymph nodes: Cervical, supraclavicular, and axillary nodes normal. Neurologic: Grossly normal   .. Depression screen [facility] 2/9 06/05/2021 03/27/2020  12/13/2019 04/26/2019 02/18/2019  Decreased Interest 0 0 1 0 3  Down, Depressed, Hopeless 0 0 0 0 3  PHQ - 2 Score 0 0 1 0 6  Altered sleeping - 0 3 0 3  Tired, decreased energy - 0 3 2 3   Change in appetite - 0 0 0 0  Feeling bad or failure about yourself  - 0 0 0 2  Trouble concentrating - 0 3 0 3  Moving slowly or fidgety/restless - 0 0 0 3  Suicidal thoughts - 0 0 0 0  PHQ-9 Score - 0 10 2 20   Difficult doing work/chores - Not difficult at all Very difficult Not difficult at all Very difficult  Some encounter information is confidential  and restricted. Go to Review Flowsheets activity to see all data.  Some recent data might be hidden   . GAD 7 : Generalized Anxiety Score 03/27/2020 12/13/2019 04/26/2019 02/18/2019  Nervous, Anxious, on Edge 0 2 0 0  Control/stop worrying 0 3 0 3  Worry too much - different things 0 3 0 3  Trouble relaxing 0 1 0 3  Restless 0 0 0 3  Easily annoyed or irritable 0 0 2 3  Afraid - awful might happen 0 0 0 0  Total GAD 7 Score 0 9 2 15   Anxiety Difficulty Not difficult at all - Not difficult at all Very difficult  Some encounter information is confidential and restricted. Go to Review Flowsheets activity to see all data.     Assessment:    Healthy female exam.      Plan:   6/25/20204/19/2020[name] was seen today for annual exam.  Diagnoses and all orders for this visit:  Routine physical examination -     COMPLETE METABOLIC PANEL WITH GFR -     Lipid Panel w/reflex Direct LDL -     TSH -     CBC with Differential/Platelet -     VITAMIN D 25 Hydroxy (Vit-D Deficiency, Fractures) -     HIV antibody (with reflex) -     RPR -     Thyroid peroxidase antibody  Migraine without aura and without status migrainosus, not intractable -     rizatriptan (MAXALT-MLT) 10 MG disintegrating tablet; TAKE 1 TABLET BY MOUTH AS NEEDED FOR MIGRAINE. MAY REPEAT IN 2 HOURS IF NEEDED  Attention deficit hyperactivity disorder (ADHD), unspecified ADHD type -     amphetamine-dextroamphetamine (ADDERALL) 30 MG tablet; Take 1 tablet by mouth 2 (two) times daily. -     amphetamine-dextroamphetamine (ADDERALL) 30 MG tablet; Take 1 tablet by mouth 2 (two) times daily. -     amphetamine-dextroamphetamine (ADDERALL) 30 MG tablet; Take 1 tablet by mouth 2 (two) times daily.  Bipolar 2 disorder (HCC)  Migraine without status migrainosus, not intractable, unspecified migraine type -     rizatriptan (MAXALT-MLT) 10 MG disintegrating tablet; TAKE 1 TABLET BY MOUTH AS NEEDED FOR MIGRAINE. MAY REPEAT IN 2 HOURS IF  NEEDED  Attention deficit hyperactivity disorder (ADHD), predominantly hyperactive type -     amphetamine-dextroamphetamine (ADDERALL) 30 MG tablet; Take 1 tablet by mouth 2 (two) times daily. -     amphetamine-dextroamphetamine (ADDERALL) 30 MG tablet; Take 1 tablet by mouth 2 (two) times daily.  Seasonal allergies -     fluticasone (FLONASE) 50 MCG/ACT nasal spray; USE 1 SPRAY IN EACH NOSTRIL ONCE A DAY  Thyroid fullness -     TSH -     Thyroid peroxidase antibody -  US THYROID; Future  Screening examination for STD (sexually transmitted disease) -     Cancel: C. trachomatis/N. gonorrhoeae RNA -     C. trachomatis/N. gonorrhoeae RNA  Acne vulgaris -     tretinoin (RETIN-A) 0.1 % cream; Apply topically at bedtime.  Screening for lipid disorders -     Lipid Panel w/reflex Direct LDL  Screening for diabetes mellitus -     COMPLETE METABOLIC PANEL WITH GFR  Vitamin D insufficiency -     VITAMIN D 25 Hydroxy (Vit-D Deficiency, Fractures)  Screening for STDs (sexually transmitted diseases) -     HIV antibody (with reflex) -     RPR -     C. trachomatis/N. gonorrhoeae RNA -     C. trachomatis/N. gonorrhoeae RNA  HSV-2 infection -     valACYclovir (VALTREX) 1000 MG tablet; TAKE 2 TABLETS TWICE A DAY FOR ONE DAY FOR OUTBREAK.  .. Discussed 150 minutes of exercise a week.  Encouraged vitamin D 1000 units and Calcium 1300mg  or 4 servings of dairy a day.  PHQ-9 discussed. No concerns.  Fasting labs ordered.  STD screening ordered.  Valtrex refilled for outbreaks.  Declined covid vaccine.   Adderall refilled for 3 months.   Thyroid was full on exam. TSH panel ordered with u/s to evaluate.   Acne- retin prescribed to use as needed for flares.     See After Visit Summary for Counseling Recommendations

## 2021-06-06 ENCOUNTER — Other Ambulatory Visit (HOSPITAL_COMMUNITY): Payer: Self-pay

## 2021-06-06 LAB — CBC WITH DIFFERENTIAL/PLATELET
Absolute Monocytes: 351 cells/uL (ref 200–950)
Basophils Absolute: 39 cells/uL (ref 0–200)
Basophils Relative: 0.6 %
Eosinophils Absolute: 364 cells/uL (ref 15–500)
Eosinophils Relative: 5.6 %
HCT: 41.4 % (ref 35.0–45.0)
Hemoglobin: 13.5 g/dL (ref 11.7–15.5)
Lymphs Abs: 2074 cells/uL (ref 850–3900)
MCH: 29.6 pg (ref 27.0–33.0)
MCHC: 32.6 g/dL (ref 32.0–36.0)
MCV: 90.8 fL (ref 80.0–100.0)
MPV: 12.2 fL (ref 7.5–12.5)
Monocytes Relative: 5.4 %
Neutro Abs: 3673 cells/uL (ref 1500–7800)
Neutrophils Relative %: 56.5 %
Platelets: 213 10*3/uL (ref 140–400)
RBC: 4.56 10*6/uL (ref 3.80–5.10)
RDW: 12.7 % (ref 11.0–15.0)
Total Lymphocyte: 31.9 %
WBC: 6.5 10*3/uL (ref 3.8–10.8)

## 2021-06-06 LAB — COMPLETE METABOLIC PANEL WITH GFR
AG Ratio: 1.6 (calc) (ref 1.0–2.5)
ALT: 13 U/L (ref 6–29)
AST: 18 U/L (ref 10–30)
Albumin: 4.5 g/dL (ref 3.6–5.1)
Alkaline phosphatase (APISO): 46 U/L (ref 31–125)
BUN: 11 mg/dL (ref 7–25)
CO2: 25 mmol/L (ref 20–32)
Calcium: 9.1 mg/dL (ref 8.6–10.2)
Chloride: 105 mmol/L (ref 98–110)
Creat: 0.78 mg/dL (ref 0.50–0.97)
Globulin: 2.9 g/dL (calc) (ref 1.9–3.7)
Glucose, Bld: 85 mg/dL (ref 65–99)
Potassium: 3.8 mmol/L (ref 3.5–5.3)
Sodium: 138 mmol/L (ref 135–146)
Total Bilirubin: 0.7 mg/dL (ref 0.2–1.2)
Total Protein: 7.4 g/dL (ref 6.1–8.1)
eGFR: 103 mL/min/{1.73_m2} (ref >=60)

## 2021-06-06 LAB — THYROID PEROXIDASE ANTIBODY: Thyroperoxidase Ab SerPl-aCnc: 2 IU/mL (ref <9)

## 2021-06-06 LAB — LIPID PANEL W/REFLEX DIRECT LDL
Cholesterol: 222 mg/dL — ABNORMAL HIGH (ref <200)
HDL: 96 mg/dL (ref >=50)
LDL Cholesterol (Calc): 113 mg/dL (calc) — ABNORMAL HIGH
Non-HDL Cholesterol (Calc): 126 mg/dL (calc) (ref <130)
Total CHOL/HDL Ratio: 2.3 (calc) (ref <5.0)
Triglycerides: 52 mg/dL (ref <150)

## 2021-06-06 LAB — RPR: RPR Ser Ql: NONREACTIVE

## 2021-06-06 LAB — HIV ANTIBODY (ROUTINE TESTING W REFLEX): HIV 1&2 Ab, 4th Generation: NONREACTIVE

## 2021-06-06 LAB — C. TRACHOMATIS/N. GONORRHOEAE RNA
C. trachomatis RNA, TMA: NOT DETECTED
N. gonorrhoeae RNA, TMA: NOT DETECTED

## 2021-06-06 LAB — VITAMIN D 25 HYDROXY (VIT D DEFICIENCY, FRACTURES): Vit D, 25-Hydroxy: 34 ng/mL (ref 30–100)

## 2021-06-06 LAB — TSH: TSH: 1.44 mIU/L

## 2021-06-06 NOTE — Progress Notes (Signed)
Torah,   Kidney, liver, glucose look great.  Thyroid normal range.  No anemia.  Vitamin D low normal. I would increase whatever you are taking by 1000 units a day.  Cholesterol is up some from one year ago but with current risk factors still looks great. HDL is wonderful!

## 2021-06-07 ENCOUNTER — Other Ambulatory Visit (HOSPITAL_COMMUNITY): Payer: Self-pay

## 2021-06-07 MED ORDER — VALACYCLOVIR HCL 1 G PO TABS
ORAL_TABLET | ORAL | 0 refills | Status: DC
  Filled 2021-06-07: qty 30, 7d supply, fill #0

## 2021-06-18 ENCOUNTER — Ambulatory Visit (INDEPENDENT_AMBULATORY_CARE_PROVIDER_SITE_OTHER): Payer: Medicaid Other

## 2021-06-18 ENCOUNTER — Other Ambulatory Visit: Payer: Self-pay

## 2021-06-18 DIAGNOSIS — E0789 Other specified disorders of thyroid: Secondary | ICD-10-CM | POA: Diagnosis not present

## 2021-06-19 NOTE — Progress Notes (Signed)
No discrete nodules seen. Thyroid looks great. Neck fullness appears to be more weight related.

## 2021-08-03 ENCOUNTER — Ambulatory Visit: Payer: Medicaid Other

## 2021-08-04 ENCOUNTER — Ambulatory Visit: Payer: Medicaid Other

## 2021-08-05 ENCOUNTER — Other Ambulatory Visit: Payer: Self-pay

## 2021-08-05 ENCOUNTER — Other Ambulatory Visit (HOSPITAL_COMMUNITY): Payer: Self-pay

## 2021-08-05 ENCOUNTER — Ambulatory Visit
Admission: EM | Admit: 2021-08-05 | Discharge: 2021-08-05 | Disposition: A | Discharge status: Home / Self Care | Payer: Medicaid Other | Attending: Emergency Medicine | Admitting: Emergency Medicine

## 2021-08-05 DIAGNOSIS — N760 Acute vaginitis: Secondary | ICD-10-CM | POA: Diagnosis not present

## 2021-08-05 MED ORDER — FLUCONAZOLE 150 MG PO TABS: 150.0000 mg | ORAL_TABLET | ORAL | 0 refills | Status: DC

## 2021-08-05 NOTE — ED Provider Notes (Signed)
[facility]    CSN: [id number] Arrival date & time: 08/05/21  1543      History   Chief Complaint Chief Complaint  Patient presents with   Vaginal Discharge   Vaginal Itching    HPI Victoria Barnes is a 35 y.o. female.   Patient reports having yeast infection symptoms (vaginal discharge and itching) that started about 4-5 days ago./ Patient does report having some spotting 2 days ago.  She has not attempted to treat herself for this.  Vaginal discharge is thick and white and creamy.  Also complains of vaginal itching.  Denies fever, chills, abdominal pain, dysuria, genital sore, flank pain, nausea, vomiting. Does not think she is pregnant and she has not had sexual intercourse since LMP.   Vaginal Discharge Associated symptoms: vaginal itching   Vaginal Itching   Past Medical History:  Diagnosis Date   ADHD    Bipolar I disorder (HCC)    Depression    Eczema 2012   Migraine without status migrainosus, not intractable 02/26/2016    Patient Active Problem List   Diagnosis Date Noted   Tachycardia 05/02/2019   Alopecia 05/02/2019   Acute stress reaction 02/07/2019   Anxiety 09/01/2018   Seasonal allergies 03/14/2018   Dehydration 12/16/2017   Bipolar 2 disorder (HCC) 07/07/2017   Fatigue 02/23/2017   Paresthesias 09/10/2016   Situational mixed anxiety and depressive disorder 06/17/2016   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   Migraine without status migrainosus, not intractable 02/26/2016   Attention deficit hyperactivity disorder (ADHD) 01/08/2016   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 Authorizing Provider  fluconazole (DIFLUCAN) 150 MG tablet Take 1 tablet (150 mg total) by mouth once a week. 08/05/21  Yes Cathlyn Parsons, NP  amphetamine-dextroamphetamine (ADDERALL) 30 MG tablet TAKE 1 TABLET BY MOUTH 2 TIMES DAILY 10/05/20 04/03/21  Breeback, Jade L, PA-C  amphetamine-dextroamphetamine (ADDERALL) 30 MG tablet Take 1 tablet by mouth 2 (two) times daily. 08/05/21   Breeback, Jade L, PA-C  amphetamine-dextroamphetamine (ADDERALL) 30 MG tablet Take 1 tablet by mouth 2 (two) times daily. 07/06/21   Breeback, Jade L, PA-C  amphetamine-dextroamphetamine (ADDERALL) 30 MG tablet Take 1 tablet by mouth 2 (two) times daily. 06/05/21 07/06/21  Jomarie Longs, PA-C  cholecalciferol (VITAMIN D3) 25 MCG (1000 UNIT) tablet Take 1,000 Units by mouth daily.    [provider]  clonazePAM (KLONOPIN) 1 MG tablet Take 1 tablet (1 mg total) by mouth daily. As needed for acute anxiety and panic. 12/13/19   Breeback, Lonna Cobb, PA-C  fexofenadine (ALLEGRA ALLERGY) 180 MG tablet Take 1 tablet (180 mg total) by mouth daily. 10/05/20   Breeback, Jade L, PA-C  fluticasone (FLONASE) 50 MCG/ACT nasal spray USE 1 SPRAY IN EACH NOSTRIL  ONCE A DAY 06/05/21 06/05/22  Breeback, Jade L, PA-C  Ginkgo Biloba 40 MG TABS Take by mouth.    [provider]  rizatriptan (MAXALT-MLT) 10 MG disintegrating tablet TAKE 1 TABLET BY MOUTH AS NEEDED FOR MIGRAINE. MAY REPEAT IN 2 HOURS IF NEEDED 06/05/21 06/05/22  Breeback, Jade L, PA-C  tretinoin (RETIN-A) 0.1 % cream Apply topically at bedtime. 06/05/21   Breeback, Jade L, PA-C  valACYclovir (VALTREX) 1000 MG tablet TAKE 2 TABLETS TWICE A DAY FOR ONE DAY FOR OUTBREAK. 06/07/21   Jomarie Longs, PA-C    Family History Family History  Problem Relation Age of Onset   Hyperlipidemia Mother    Personality disorder Maternal Grandmother    Depression Maternal Grandmother    Mood Disorder Maternal Grandmother     Social  History Social History   Tobacco Use   Smoking status: Former    Types: Cigarettes    Quit date: 11/2014    Years since quitting: 6.7   Smokeless tobacco: Never   Tobacco comments:    quit 2 years  Vaping Use   Vaping Use: Never used  Substance Use Topics   Alcohol use: Yes    Comment: once or twice a month - socially   Drug use: No     Allergies   Lamictal [lamotrigine], Antacid [alum & mag hydroxide-simeth], Keflex [cephalexin], Penicillins, Trintellix [vortioxetine], and Viibryd [vilazodone hcl]   Review of Systems Review of Systems  Genitourinary:  Positive for vaginal discharge.    Physical Exam Triage Vital Signs ED Triage Vitals  Enc Vitals Group     BP 08/05/21 1554 109/75     Pulse Rate 08/05/21 1554 85     Resp 08/05/21 1554 18     Temp 08/05/21 1554 97.9 F (36.6 C)     Temp Source 08/05/21 1554 Oral     SpO2 08/05/21 1554 98 %     Weight --      Height --      Head Circumference --      Peak Flow --      Pain Score 08/05/21 1552 0     Pain Loc --      Pain Edu? --      Excl. in GC? --    No data found.  Updated Vital Signs BP 109/75 (BP Location: Right Arm)   Pulse 85   Temp 97.9 F (36.6 C) (Oral)   Resp 18   LMP 07/23/2021   SpO2 98%   Visual Acuity Right Eye Distance:   Left Eye Distance:   Bilateral Distance:    Right Eye Near:   Left Eye Near:    Bilateral Near:     Physical Exam Constitutional:      General: She is not in acute distress.    Appearance: Normal appearance.  Pulmonary:     Effort: Pulmonary effort is normal.  Neurological:     Mental Status: She is alert and oriented to person, place, and time.     UC Treatments / Results  Labs (all labs ordered are listed, but only abnormal results are displayed) Labs Reviewed  CERVICOVAGINAL ANCILLARY ONLY    EKG   Radiology No results found.  Procedures Procedures (including critical care time)  Medications Ordered in UC Medications - No data to  display  Initial Impression / Assessment and Plan / UC Course  I have reviewed the triage vital signs and the nursing notes.  Pertinent labs & imaging results that were  available during my care of the patient were reviewed by me and considered in my medical decision making (see chart for details).    Patient request Diflucan x2 to treat likely yeast infection.  Prescription sent.  Final Clinical Impressions(s) / UC Diagnoses   Final diagnoses:  Vaginitis and vulvovaginitis     Discharge Instructions      If any other tests are positive, you will get a call with the results.  If the tests are negative.  You will not get a call but you can check your results in MyChart if you have a MyChart account.   ED Prescriptions     Medication Sig Dispense Auth. Provider   fluconazole (DIFLUCAN) 150 MG tablet Take 1 tablet (150 mg total) by mouth once a week. 2 tablet Cathlyn Parsons, NP      PDMP not reviewed this encounter.   Cathlyn Parsons, NP 08/05/21 [phone]

## 2021-08-05 NOTE — Discharge Instructions (Signed)
If any other tests are positive, you will get a call with the results.  If the tests are negative.  You will not get a call but you can check your results in MyChart if you have a MyChart account.

## 2021-08-05 NOTE — ED Triage Notes (Signed)
Patient reports having yeast infection symptoms (vaginal discharge and itching) that started about 4-5 days ago./ Patient does report having some spotting 2 days ago.  Patient denies having any lower back and lower abd pain.

## 2021-08-06 ENCOUNTER — Other Ambulatory Visit (HOSPITAL_COMMUNITY): Payer: Self-pay

## 2021-08-07 ENCOUNTER — Other Ambulatory Visit (HOSPITAL_COMMUNITY): Payer: Self-pay

## 2021-08-07 ENCOUNTER — Telehealth (HOSPITAL_COMMUNITY): Payer: Self-pay | Admitting: Emergency Medicine

## 2021-08-07 LAB — CERVICOVAGINAL ANCILLARY ONLY
Bacterial Vaginitis (gardnerella): POSITIVE — AB
Candida Glabrata: NEGATIVE
Candida Vaginitis: POSITIVE — AB
Chlamydia: NEGATIVE
Comment: NEGATIVE
Comment: NEGATIVE
Comment: NEGATIVE
Comment: NEGATIVE
Comment: NEGATIVE
Comment: NORMAL
Neisseria Gonorrhea: NEGATIVE
Trichomonas: NEGATIVE

## 2021-08-07 MED ORDER — METRONIDAZOLE 500 MG PO TABS
500.0000 mg | ORAL_TABLET | Freq: Two times a day (BID) | ORAL | 0 refills | Status: DC
  Filled 2021-08-07: qty 14, 7d supply, fill #0

## 2021-11-12 ENCOUNTER — Other Ambulatory Visit: Payer: Self-pay | Admitting: Physician Assistant

## 2021-11-12 ENCOUNTER — Other Ambulatory Visit (HOSPITAL_COMMUNITY): Payer: Self-pay

## 2021-11-12 ENCOUNTER — Other Ambulatory Visit: Payer: Self-pay

## 2021-11-12 DIAGNOSIS — F909 Attention-deficit hyperactivity disorder, unspecified type: Secondary | ICD-10-CM

## 2021-11-12 DIAGNOSIS — F901 Attention-deficit hyperactivity disorder, predominantly hyperactive type: Secondary | ICD-10-CM

## 2021-11-12 DIAGNOSIS — L700 Acne vulgaris: Secondary | ICD-10-CM

## 2021-11-12 MED ORDER — AMPHETAMINE-DEXTROAMPHETAMINE 30 MG PO TABS
30.0000 mg | ORAL_TABLET | Freq: Two times a day (BID) | ORAL | 0 refills | Status: DC
  Filled 2021-11-12: qty 60, 30d supply, fill #0

## 2021-11-12 MED ORDER — TRETINOIN 0.1 % EX CREA
TOPICAL_CREAM | Freq: Every day | CUTANEOUS | 1 refills | Status: DC
  Filled 2021-11-12: qty 45, 30d supply, fill #0
  Filled 2022-01-29: qty 45, 30d supply, fill #1

## 2021-11-13 ENCOUNTER — Other Ambulatory Visit (HOSPITAL_COMMUNITY): Payer: Self-pay

## 2021-11-14 ENCOUNTER — Other Ambulatory Visit (HOSPITAL_COMMUNITY): Payer: Self-pay

## 2021-11-27 ENCOUNTER — Telehealth: Payer: Medicaid Other | Admitting: Family

## 2021-11-27 DIAGNOSIS — J029 Acute pharyngitis, unspecified: Secondary | ICD-10-CM

## 2021-11-27 MED ORDER — FLUCONAZOLE 150 MG PO TABS: 150.0000 mg | ORAL_TABLET | ORAL | 0 refills | Status: DC | PRN

## 2021-11-27 MED ORDER — AZITHROMYCIN 250 MG PO TABS: ORAL_TABLET | ORAL | 0 refills | Status: DC

## 2021-11-27 NOTE — Progress Notes (Signed)
E-Visit for Sore Throat - Strep Symptoms  We are sorry that you are not feeling well.  Here is how we plan to help!  Based on what you have shared with me it is likely that you have strep pharyngitis.  Strep pharyngitis is inflammation and infection in the back of the throat.  This is an infection cause by bacteria and is treated with antibiotics.  I have prescribed Azithromycin 250 mg two tablets today and then one daily for 4 additional days. I have also sent in diflucan to your pharmacy.  For throat pain, we recommend over the counter oral pain relief medications such as acetaminophen or aspirin, or anti-inflammatory medications such as ibuprofen or naproxen sodium. Topical treatments such as oral throat lozenges or sprays may be used as needed. Strep infections are not as easily transmitted as other respiratory infections, however we still recommend that you avoid close contact with loved ones, especially the very young and elderly.  Remember to wash your hands thoroughly throughout the day as this is the number one way to prevent the spread of infection and wipe down door knobs and counters with disinfectant.   Home Care: Only take medications as instructed by your medical team. Complete the entire course of an antibiotic. Do not take these medications with alcohol. A steam or ultrasonic humidifier can help congestion.  You can place a towel over your head and breathe in the steam from hot water coming from a faucet. Avoid close contacts especially the very young and the elderly. Cover your mouth when you cough or sneeze. Always remember to wash your hands.  Get Help Right Away If: You develop worsening fever or sinus pain. You develop a severe head ache or visual changes. Your symptoms persist after you have completed your treatment plan.  Make sure you Understand these instructions. Will watch your condition. Will get help right away if you are not doing well or get worse.   Thank  you for choosing an e-visit.  Your e-visit answers were reviewed by a board certified advanced clinical practitioner to complete your personal care plan. Depending upon the condition, your plan could have included both over the counter or prescription medications.  Please review your pharmacy choice. Make sure the pharmacy is open so you can pick up prescription now. If there is a problem, you may contact your provider through [employer] and have the prescription routed to another pharmacy.  Your safety is important to [place]. If you have drug allergies check your prescription carefully.   For the next 24 hours you can use MyChart to ask questions about today's visit, request a non-urgent call back, or ask for a work or school excuse. You will get an email in the next two days asking about your experience. I hope that your e-visit has been valuable and will speed your recovery.   Approximately 5 minutes was spent documenting and reviewing patient's chart.

## 2022-01-30 ENCOUNTER — Other Ambulatory Visit (HOSPITAL_COMMUNITY): Payer: Self-pay

## 2022-01-30 ENCOUNTER — Telehealth: Payer: Medicaid Other | Admitting: Physician Assistant

## 2022-01-30 DIAGNOSIS — B379 Candidiasis, unspecified: Secondary | ICD-10-CM

## 2022-01-30 DIAGNOSIS — B9689 Other specified bacterial agents as the cause of diseases classified elsewhere: Secondary | ICD-10-CM

## 2022-01-30 DIAGNOSIS — T3695XA Adverse effect of unspecified systemic antibiotic, initial encounter: Secondary | ICD-10-CM | POA: Diagnosis not present

## 2022-01-30 DIAGNOSIS — N760 Acute vaginitis: Secondary | ICD-10-CM | POA: Diagnosis not present

## 2022-01-30 MED ORDER — FLUCONAZOLE 150 MG PO TABS
150.0000 mg | ORAL_TABLET | Freq: Once | ORAL | 0 refills | Status: AC
Start: 2022-01-30 — End: 2022-01-30

## 2022-01-30 MED ORDER — METRONIDAZOLE 500 MG PO TABS: 500.0000 mg | ORAL_TABLET | Freq: Two times a day (BID) | ORAL | 0 refills | Status: DC

## 2022-01-30 NOTE — Progress Notes (Signed)
I have spent 5 minutes in review of e-visit questionnaire, review and updating patient chart, medical decision making and response to patient.   Brock Mokry Cody Markala Sitts, PA-C    

## 2022-01-30 NOTE — Progress Notes (Signed)
E-Visit for Vaginal Symptoms ? ?We are sorry that you are not feeling well. Here is how we plan to help! ?Based on what you shared with me it looks like you: May have a vaginosis due to bacteria ? ?Vaginosis is an inflammation of the vagina that can result in discharge, itching and pain. The cause is usually a change in the normal balance of vaginal bacteria or an infection. Vaginosis can also result from reduced estrogen levels after menopause. ? ?The most common causes of vaginosis are: ? ? Bacterial vaginosis which results from an overgrowth of one on several organisms that are normally present in your vagina. ? ? Yeast infections which are caused by a naturally occurring fungus called candida. ? ? Vaginal atrophy (atrophic vaginosis) which results from the thinning of the vagina from reduced estrogen levels after menopause. ? ? Trichomoniasis which is caused by a parasite and is commonly transmitted by sexual intercourse. ? ?Factors that increase your risk of developing vaginosis include: ?Medications, such as antibiotics and steroids ?Uncontrolled diabetes ?Use of hygiene products such as bubble bath, vaginal spray or vaginal deodorant ?Douching ?Wearing damp or tight-fitting clothing ?Using an intrauterine device (IUD) for birth control ?Hormonal changes, such as those associated with pregnancy, birth control pills or menopause ?Sexual activity ?Having a sexually transmitted infection ? ?Your treatment plan is Metronidazole or Flagyl 500mg  twice a day for 7 days.  I have electronically sent this prescription into the pharmacy that you have chosen. I have sent in Diflucan (x2) in case of yeast currently and risk of yeast from use of antibiotic use.  ? ?Be sure to take all of the medication as directed. Stop taking any medication if you develop a rash, tongue swelling or shortness of breath. Mothers who are breast feeding should consider pumping and discarding their breast milk while on these antibiotics.  However, there is no consensus that infant exposure at these doses would be harmful.  ?Remember that medication creams can weaken latex condoms. ?. ? ? ?HOME CARE: ? ?Good hygiene may prevent some types of vaginosis from recurring and may relieve some symptoms: ? ?Avoid baths, hot tubs and whirlpool spas. Rinse soap from your outer genital area after a shower, and dry the area well to prevent irritation. Don't use scented or harsh soaps, such as those with deodorant or antibacterial action. ?Avoid irritants. These include scented tampons and pads. ?Wipe from front to back after using the toilet. Doing so avoids spreading fecal bacteria to your vagina. ? ?Other things that may help prevent vaginosis include: ? ?Don't douche. Your vagina doesn't require cleansing other than normal bathing. Repetitive douching disrupts the normal organisms that reside in the vagina and can actually increase your risk of vaginal infection. Douching won't clear up a vaginal infection. ?Use a latex condom. Both female and female latex condoms may help you avoid infections spread by sexual contact. ?Wear cotton underwear. Also wear pantyhose with a cotton crotch. If you feel comfortable without it, skip wearing underwear to bed. Yeast thrives in moist environments ?Your symptoms should improve in the next day or two. ? ?GET HELP RIGHT AWAY IF: ? ?You have pain in your lower abdomen ( pelvic area or over your ovaries) ?You develop nausea or vomiting ?You develop a fever ?Your discharge changes or worsens ?You have persistent pain with intercourse ?You develop shortness of breath, a rapid pulse, or you faint. ? ?These symptoms could be signs of problems or infections that need to be evaluated by  a medical provider now. ? ?MAKE SURE YOU  ? ?Understand these instructions. ?Will watch your condition. ?Will get help right away if you are not doing well or get worse. ? ?Thank you for choosing an e-visit. ? ?Your e-visit answers were reviewed by a  board certified advanced clinical practitioner to complete your personal care plan. Depending upon the condition, your plan could have included both over the counter or prescription medications. ? ?Please review your pharmacy choice. Make sure the pharmacy is open so you can pick up prescription now. If there is a problem, you may contact your provider through [employer] and have the prescription routed to another pharmacy.  Your safety is important to [place]. If you have drug allergies check your prescription carefully.  ? ?For the next 24 hours you can use MyChart to ask questions about today's visit, request a non-urgent call back, or ask for a work or school excuse. ?You will get an email in the next two days asking about your experience. I hope that your e-visit has been valuable and will speed your recovery. ? ?

## 2022-01-31 ENCOUNTER — Other Ambulatory Visit (HOSPITAL_COMMUNITY): Payer: Self-pay

## 2022-03-10 ENCOUNTER — Encounter: Payer: Self-pay | Admitting: Physician Assistant

## 2022-03-10 ENCOUNTER — Ambulatory Visit: Payer: Medicaid Other | Admitting: Physician Assistant

## 2022-03-10 ENCOUNTER — Other Ambulatory Visit (HOSPITAL_COMMUNITY): Payer: Self-pay

## 2022-03-10 VITALS — BP 130/71 | HR 100 | Ht 59.0 in | Wt 132.0 lb

## 2022-03-10 DIAGNOSIS — J302 Other seasonal allergic rhinitis: Secondary | ICD-10-CM

## 2022-03-10 DIAGNOSIS — L731 Pseudofolliculitis barbae: Secondary | ICD-10-CM

## 2022-03-10 DIAGNOSIS — F901 Attention-deficit hyperactivity disorder, predominantly hyperactive type: Secondary | ICD-10-CM | POA: Diagnosis not present

## 2022-03-10 DIAGNOSIS — L700 Acne vulgaris: Secondary | ICD-10-CM | POA: Diagnosis not present

## 2022-03-10 DIAGNOSIS — Z113 Encounter for screening for infections with a predominantly sexual mode of transmission: Secondary | ICD-10-CM | POA: Diagnosis not present

## 2022-03-10 DIAGNOSIS — F909 Attention-deficit hyperactivity disorder, unspecified type: Secondary | ICD-10-CM

## 2022-03-10 MED ORDER — AMPHETAMINE-DEXTROAMPHETAMINE 30 MG PO TABS
30.0000 mg | ORAL_TABLET | Freq: Every day | ORAL | 0 refills | Status: DC
  Filled 2022-06-06: qty 60, 60d supply, fill #0

## 2022-03-10 MED ORDER — AMPHETAMINE-DEXTROAMPHETAMINE 30 MG PO TABS
30.0000 mg | ORAL_TABLET | Freq: Two times a day (BID) | ORAL | 0 refills | Status: DC
  Filled 2022-03-10: qty 50, 25d supply, fill #0

## 2022-03-10 MED ORDER — FEXOFENADINE HCL 180 MG PO TABS
180.0000 mg | ORAL_TABLET | Freq: Every day | ORAL | 3 refills | Status: DC
  Filled 2022-03-10 – 2022-09-04 (×2): qty 90, 90d supply, fill #0

## 2022-03-10 MED ORDER — TRETINOIN 0.1 % EX CREA
TOPICAL_CREAM | Freq: Every day | CUTANEOUS | 1 refills | Status: DC
  Filled 2022-03-10: qty 45, 30d supply, fill #0
  Filled 2022-06-06: qty 45, 30d supply, fill #1

## 2022-03-10 MED ORDER — AMPHETAMINE-DEXTROAMPHETAMINE 30 MG PO TABS: 30.0000 mg | ORAL_TABLET | Freq: Two times a day (BID) | ORAL | 0 refills | Status: DC

## 2022-03-10 NOTE — Progress Notes (Signed)
? ?Acute Office Visit ? ?Subjective:  ? ?  ?Patient ID: Victoria Barnes, female    DOB: 11/12/1985, 36 y.o.   MRN: [id number] ? ?Chief Complaint  ?Patient presents with  ? Abscess  ? ? ?HPI ?Patient is in today small papule in the left medial groin area. She wants it gone because of how it looks. She believed it was an ingrown hair but did not get it all out. Present for about 2 months. It initially was tender but not it kinda grew this hard nodule.  ? ?She had a new partner and request STI testing. No STI symptoms reported.  ? ?Doing well on adderall. No concerns.  ? ?.. ?Active Ambulatory Problems  ?  Diagnosis Date Noted  ? Eczema 07/17/2015  ? 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 Attention deficit hyperactivity disorder (ADHD) 01/08/2016  ? Migraine without status migrainosus, not intractable 02/26/2016  ? 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 Situational mixed anxiety and depressive disorder 06/17/2016  ? Paresthesias 09/10/2016  ? Fatigue 02/23/2017  ? Bipolar 2 disorder ([place]) 07/07/2017  ? Dehydration 12/16/2017  ? Seasonal allergies 03/14/2018  ? Anxiety 09/01/2018  ? Acute stress reaction 02/07/2019  ? Tachycardia 05/02/2019  ? Alopecia 05/02/2019  ? ?Resolved Ambulatory Problems  ?  Diagnosis Date Noted  ? Vitamin D deficiency 06/20/2015  ? Depression 07/17/2015  ? Abnormal weight gain 07/19/2015  ? Pain in joint, ankle and foot 08/02/2015  ? Exposure to strep throat 09/07/2015  ? Viral gastroenteritis 09/13/2015  ? Abscess of skin 09/13/2015  ? Pharyngitis 11/06/2015  ? Migraine without aura and with status migrainosus, not intractable 05/22/2016  ? Insomnia 05/22/2016  ? Nausea without vomiting 05/24/2016  ? 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 Dehydration 08/06/2016  ? Episodic mood disorder ([place]) 11/11/2016  ? Influenza-like illness 12/11/2016  ? Side effects of treatment 02/23/2017  ? ?Past Medical History:  ?Diagnosis Date  ? ADHD   ? Bipolar I disorder ([place])    ? ? ? ? ?ROS ?See HPI.  ? ?   ?Objective:  ?  ?BP 130/71   Pulse 100   Ht 4\' 11"  (1.499 m)   Wt 132 lb (59.9 kg)   SpO2 97%   BMI 26.66 kg/m?  ? ? ?Physical Exam ?Vitals reviewed.  ?Cardiovascular:  ?   Rate and Rhythm: Normal rate.  ?Skin: ?   Comments: Left inner upper thigh 35mm papule that is firm but non tender. No warmth, redness, swelling.   ?Neurological:  ?   Mental Status: She is alert.  ? ? ? ?Incision and Drainage Procedure Note ?Diagnosis:  ingrown hair ?Location:  left medial upper inner thigh ?Informed Consent: Discussed risks (permanent scarring, light or dark discoloration, infection, pain, bleeding, bruising, redness, damage to adjacent structures, and recurrence of the lesion) and benefits of the procedure, as well as the alternatives.  Informed consent was obtained. ?Preparation: The area was prepared and draped in a standard fashion. ?Anesthesia: Lidocaine 1% without epinephrine ?Procedure Details: An incision was made overlying the lesion. The lesion drained  ingrown hair with small amt of pus .  ? ?Antibiotic ointment and a sterile pressure dressing were applied. The patient tolerated procedure well. ?Total number of lesions drained: 1 ?Plan: The patient was instructed on post-op care. Recommend OTC analgesia as needed for pain. ? ?   ?Assessment & Plan:  ?..Tinslee was seen today for abscess. ? ?Diagnoses and all orders for this visit: ? ?Ingrown hair ? ?  Acne vulgaris ?-     tretinoin (RETIN-A) 0.1 % cream; Apply topically at bedtime. ? ?Seasonal allergies ?-     fexofenadine (ALLEGRA ALLERGY) 180 MG tablet; Take 1 tablet (180 mg total) by mouth daily. ? ?Attention deficit hyperactivity disorder (ADHD), predominantly hyperactive type ?-     amphetamine-dextroamphetamine (ADDERALL) 30 MG tablet; Take 1 tablet by mouth 2 (two) times daily. ?-     amphetamine-dextroamphetamine (ADDERALL) 30 MG tablet; Take 1 tablet by mouth daily. ?-     amphetamine-dextroamphetamine (ADDERALL) 30 MG tablet;  Take 1 tablet by mouth 2 (two) times daily. ? ?Screen for STD (sexually transmitted disease) ?-     HIV antibody (with reflex) ?-     RPR ?-     C. trachomatis/N. gonorrhoeae RNA ? ? ?STI screening ordered ?Refilled adderall  ?Follow up in 6 months ? ?I and D papule that revealed ingrown hair ?Sutured incision ?Remove in 7 days ? ?Refilled allergy and acne medications. ?Pt doing well.  ? ?Iran Planas, PA-C ? ? ?

## 2022-03-10 NOTE — Patient Instructions (Signed)
Remove in 7 days.  ?Sutured Wound Care ?Sutures are stitches that can be used to close wounds. Some stitches break down as they heal (absorbable). Other stitches need to be taken out by your doctor (nonabsorbable). Taking good care of your wound can help to prevent pain and infection. It can also help your wound heal more quickly. Follow instructions from your doctor about how to care for your sutured wound. ?Supplies needed: ?Soap and water. ?A clean, dry towel. ?Solution to clean your wound, if needed. ?A clean gauze or bandage (dressing), if needed. ?Antibiotic ointment, if told by your doctor. ?How to care for your sutured wound ? ?Keep the wound fully dry for the first 24 hours or as long as told by your doctor. After 24-48 hours, you may shower or bathe as told by your doctor. Do not soak the wound or put the wound under water until the stitches have been taken out. ?After the first 24 hours, clean the wound once a day, or as often as your doctor tells you to. Take these steps: ?Wash and rinse the wound as told by your health care provider. ?Pat the wound dry with a clean towel. Do not rub the wound. ?After cleaning the wound, put a thin layer of antibiotic ointment on the wound as told by your doctor. This will help: ?Prevent infection. ?Keep the bandage from sticking to the wound. ?Follow instructions from your doctor about how to change your bandage. Make sure you: ?Wash your hands with soap and water for at least 20 seconds. If you cannot use soap and water, use hand sanitizer. ?Change your bandage at least once a day, or as often as told by your doctor. If your dressing gets wet or dirty, change it. ?Leavestitches in place for at least 2 weeks. If you have skin glue over your stitches, this should also stay in place for at least 2 weeks. ?Leave tape strips alone (if you have them) unless you are told to take them off. You may trim the edges of the tape strips if they curl up. ?Check your wound every day  for signs of infection. Watch for: ?Redness, swelling, or pain. ?Fluid or blood. ?New warmth, a rash, or hardness at the wound site. ?Pus or a bad smell. ?Have the stitches taken out as told by your doctor. ?Follow these instructions at home: ?Medicines ?Take or apply over-the-counter and prescription medicines only as told by your doctor. ?If you were prescribed an antibiotic medicine or ointment, take or apply it as told by your doctor. Do not stop using the antibiotic even if you start to feel better. ?General instructions ?Cover your wound with clothes or put sunscreen on when you are outside. Use a sunscreen of at least 30 SPF. ?Do not scratch or pick at your wound. ?Avoid stretching your wound. ?Raise the injured area above the level of your heart while you are sitting or lying down, if possible. ?Eat a diet that includes protein, vitamin A, and vitamin C. Doing this will help your wound heal. ?Drink enough fluid to keep your pee (urine) pale yellow. ?Keep all follow-up visits. ?Contact a doctor if: ?You were given a tetanus shot and you have any of the following at the site where the needle went in: ?Swelling. ?Very bad pain. ?Redness. ?Bleeding. ?Your wound breaks open. ?You see something coming out of your wound, such as wood or glass. ?You have any of these signs of infection in or around your wound: ?Redness, swelling,  or pain. ?Fluid or blood. ?Warmth. ?A new rash. ?Your wound feels hard. ?You have a fever. ?The skin near your wound changes color. ?You have pain that does not get better with medicine. ?You get numbness around the wound. ?Get help right away if: ?You have very bad swelling or more pain around your wound. ?You have pus or a bad smell coming from your wound. ?You have painful lumps near your wound or anywhere on your body. ?You have a red streak going away from your wound. ?The wound is on your hand or foot, and: ?Your fingers or toes look pale or blue. ?You cannot move a finger or toe as  you used to do. ?You have numbness that spreads down your hand, foot, fingers, or toes. ?Summary ?Sutures are stitches that are used to close wounds. ?Taking good care of your wound can help to prevent pain and infection. ?Keep the wound fully dry for the first 24 hours or for as long as told by your doctor. After 24-48 hours, you may shower or bathe as told by your doctor. ?This information is not intended to replace advice given to you by your health care provider. Make sure you discuss any questions you have with your health care provider. ?Document Revised: 02/25/2021 Document Reviewed: 02/25/2021 ?Elsevier Patient Education ? 2023 [employer] Inc[employer] ? ?

## 2022-03-11 ENCOUNTER — Other Ambulatory Visit (HOSPITAL_COMMUNITY): Payer: Self-pay

## 2022-03-11 LAB — RPR: RPR Ser Ql: NONREACTIVE

## 2022-03-11 LAB — C. TRACHOMATIS/N. GONORRHOEAE RNA
C. trachomatis RNA, TMA: NOT DETECTED
N. gonorrhoeae RNA, TMA: NOT DETECTED

## 2022-03-11 LAB — HIV ANTIBODY (ROUTINE TESTING W REFLEX): HIV 1&2 Ab, 4th Generation: NONREACTIVE

## 2022-03-12 NOTE — Progress Notes (Signed)
Negative for all STIs

## 2022-04-07 ENCOUNTER — Other Ambulatory Visit (HOSPITAL_COMMUNITY): Payer: Self-pay

## 2022-04-07 ENCOUNTER — Other Ambulatory Visit (HOSPITAL_COMMUNITY)
Admission: RE | Admit: 2022-04-07 | Discharge: 2022-04-07 | Disposition: A | Discharge status: Home / Self Care | Payer: Medicaid Other | Source: Ambulatory Visit | Attending: Physician Assistant | Admitting: Physician Assistant

## 2022-04-07 ENCOUNTER — Encounter: Payer: Self-pay | Admitting: Physician Assistant

## 2022-04-07 ENCOUNTER — Ambulatory Visit (INDEPENDENT_AMBULATORY_CARE_PROVIDER_SITE_OTHER): Payer: Medicaid Other | Admitting: Physician Assistant

## 2022-04-07 VITALS — BP 112/77 | HR 100 | Ht 59.0 in | Wt 132.0 lb

## 2022-04-07 DIAGNOSIS — Z13220 Encounter for screening for lipoid disorders: Secondary | ICD-10-CM

## 2022-04-07 DIAGNOSIS — E559 Vitamin D deficiency, unspecified: Secondary | ICD-10-CM | POA: Diagnosis not present

## 2022-04-07 DIAGNOSIS — Z131 Encounter for screening for diabetes mellitus: Secondary | ICD-10-CM | POA: Diagnosis not present

## 2022-04-07 DIAGNOSIS — N898 Other specified noninflammatory disorders of vagina: Secondary | ICD-10-CM | POA: Insufficient documentation

## 2022-04-07 DIAGNOSIS — Z0000 Encounter for general adult medical examination without abnormal findings: Secondary | ICD-10-CM | POA: Insufficient documentation

## 2022-04-07 DIAGNOSIS — F419 Anxiety disorder, unspecified: Secondary | ICD-10-CM

## 2022-04-07 DIAGNOSIS — E0789 Other specified disorders of thyroid: Secondary | ICD-10-CM

## 2022-04-07 MED ORDER — CLONAZEPAM 1 MG PO TABS
1.0000 mg | ORAL_TABLET | Freq: Every day | ORAL | 1 refills | Status: DC
  Filled 2022-04-07: qty 30, 30d supply, fill #0
  Filled 2022-09-04: qty 30, 30d supply, fill #1

## 2022-04-07 MED ORDER — FLUCONAZOLE 150 MG PO TABS: 150.0000 mg | ORAL_TABLET | Freq: Once | ORAL | 1 refills | Status: AC

## 2022-04-07 MED ORDER — FLUCONAZOLE 150 MG PO TABS
150.0000 mg | ORAL_TABLET | Freq: Once | ORAL | 0 refills | Status: AC
  Filled 2022-04-07: qty 2, 3d supply, fill #0

## 2022-04-07 NOTE — Patient Instructions (Signed)

## 2022-04-07 NOTE — Progress Notes (Signed)
Complete physical exam  Patient: Victoria Barnes   DOB: 07-31-1986   36 y.o. Female  MRN: [id number]  Subjective:    No chief complaint on file.   Victoria Barnes is a 36 y.o. female who presents today for a complete physical exam. She reports consuming a general diet. The patient does not participate in regular exercise at present. She generally feels well. She reports sleeping fairly well. She does have additional problems to discuss today.   She is having yeast like itching and discharge.    Most recent fall risk assessment:    03/10/2022    3:03 PM  Juana Diaz in the past year? 0  Number falls in past yr: 0  Injury with Fall? 0     Most recent depression screenings:    03/10/2022    3:02 PM 06/05/2021    8:40 AM  PHQ 2/9 Scores  PHQ - 2 Score 0 0    Vision:Within last year and Dental: No current dental problems  Patient Active Problem List   Diagnosis Date Noted  . Tachycardia 05/02/2019  . Alopecia 05/02/2019  . Acute stress reaction 02/07/2019  . Anxiety 09/01/2018  . Seasonal allergies 03/14/2018  . Dehydration 12/16/2017  . Bipolar 2 disorder ([place]) 07/07/2017  . Fatigue 02/23/2017  . Paresthesias 09/10/2016  . 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 Migraine without status migrainosus, not intractable 02/26/2016  . Attention deficit hyperactivity disorder (ADHD) 01/08/2016  . 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 Eczema 07/17/2015   Past Medical History:  Diagnosis Date  . ADHD   . Bipolar I disorder ([place])   . Depression   . Eczema 2012  . Migraine without status migrainosus, not intractable 02/26/2016   Family History  Problem Relation Age of Onset  . Hyperlipidemia Mother   . Personality disorder Maternal Grandmother   . Depression Maternal Grandmother   . Mood Disorder Maternal Grandmother    Allergies  Allergen Reactions  . Lamictal [Lamotrigine] Rash    Possible SJS  . Antacid [Alum  & Mag Hydroxide-Simeth] Nausea And Vomiting  . Keflex [Cephalexin] Nausea And Vomiting  . Penicillins Nausea And Vomiting  . Trintellix [Vortioxetine]     Swelling/numbness and tingling of extremities.   Arman Filter Hcl]     Abnormal Frightening Dreams      Patient Care Team: Lavada Mesi as PCP - General (Family Medicine)   Outpatient Medications Prior to Visit  Medication Sig  . amphetamine-dextroamphetamine (ADDERALL) 30 MG tablet Take 1 tablet by mouth 2 (two) times daily.  [name] amphetamine-dextroamphetamine (ADDERALL) 30 MG tablet Take 1 tablet by mouth 2 (two) times daily.  Derrill Memo ON 04/10/2022] amphetamine-dextroamphetamine (ADDERALL) 30 MG tablet Take 1 tablet by mouth daily.  Derrill Memo ON 05/10/2022] amphetamine-dextroamphetamine (ADDERALL) 30 MG tablet Take 1 tablet by mouth 2 (two) times daily.  . cholecalciferol (VITAMIN D3) 25 MCG (1000 UNIT) tablet Take 1,000 Units by mouth daily.  . clonazePAM (KLONOPIN) 1 MG tablet Take 1 tablet (1 mg total) by mouth daily. As needed for acute anxiety and panic.  . fexofenadine (ALLEGRA ALLERGY) 180 MG tablet Take 1 tablet (180 mg total) by mouth daily.  . fluticasone (FLONASE) 50 MCG/ACT nasal spray USE 1 SPRAY IN EACH NOSTRIL ONCE A DAY  . Ginkgo Biloba 40 MG TABS Take by mouth.  . rizatriptan (MAXALT-MLT) 10 MG disintegrating tablet TAKE 1 TABLET BY MOUTH AS NEEDED  FOR MIGRAINE. MAY REPEAT IN 2 HOURS IF NEEDED  . tretinoin (RETIN-A) 0.1 % cream Apply topically at bedtime.  . valACYclovir (VALTREX) 1000 MG tablet TAKE 2 TABLETS TWICE A DAY FOR ONE DAY FOR OUTBREAK.  . [DISCONTINUED] amphetamine-dextroamphetamine (ADDERALL) 30 MG tablet TAKE 1 TABLET BY MOUTH 2 TIMES DAILY   No facility-administered medications prior to visit.    Review of Systems  All other systems reviewed and are negative.        Objective:     There were no vitals taken for this visit. BP Readings from Last 3 Encounters:  03/10/22 130/71   08/05/21 109/75  06/05/21 122/78   Wt Readings from Last 3 Encounters:  03/10/22 132 lb (59.9 kg)  06/05/21 135 lb 3.2 oz (61.3 kg)  10/05/20 125 lb (56.7 kg)      Physical Exam Vitals reviewed.  Constitutional:      Appearance: Normal appearance.  HENT:     Head: Normocephalic.     Nose: Nose normal.     Mouth/Throat:     Mouth: Mucous membranes are moist.  Eyes:     General:        Right eye: No discharge.        Left eye: No discharge.     Conjunctiva/sclera: Conjunctivae normal.  Neck:     Vascular: No carotid bruit.  Cardiovascular:     Rate and Rhythm: Normal rate and regular rhythm.     Pulses: Normal pulses.     Heart sounds: Normal heart sounds.  Pulmonary:     Effort: Pulmonary effort is normal.     Breath sounds: Normal breath sounds.  Abdominal:     General: Bowel sounds are normal. There is no distension.     Palpations: Abdomen is soft. There is no mass.     Tenderness: There is no abdominal tenderness. There is no right CVA tenderness, left CVA tenderness, guarding or rebound.     Hernia: No hernia is present.  Genitourinary:    General: Normal vulva.     Vagina: Vaginal discharge present.     Rectum: Normal.  Musculoskeletal:     Cervical back: Normal range of motion and neck supple.     Right lower leg: No edema.     Left lower leg: No edema.  Lymphadenopathy:     Cervical: No cervical adenopathy.  Neurological:     General: No focal deficit present.     Mental Status: She is alert and oriented to person, place, and time.  Psychiatric:        Mood and Affect: Mood normal.        Assessment & Plan:    Routine Health Maintenance and Physical Exam  Immunization History  Administered Date(s) Administered  . Influenza,inj,Quad PF,6+ Mos 09/07/2014, 08/16/2018  . Influenza-Unspecified 08/04/2015, 07/29/2017  . Tdap 09/03/2014    Health Maintenance  Topic Date Due  . COVID-19 Vaccine (1) Never done  . INFLUENZA VACCINE  06/03/2022  .  PAP SMEAR-Modifier  03/28/2023  . TETANUS/TDAP  09/03/2024  . Hepatitis C Screening  Completed  . HIV Screening  Completed  . HPV VACCINES  Aged Out    Discussed health benefits of physical activity, and encouraged her to engage in regular exercise appropriate for her age and condition.    No follow-ups on file.     Iran Planas, PA-C

## 2022-04-08 LAB — CBC WITH DIFFERENTIAL/PLATELET
Absolute Monocytes: 328 cells/uL (ref 200–950)
Basophils Absolute: 31 cells/uL (ref 0–200)
Basophils Relative: 0.4 %
Eosinophils Absolute: 343 cells/uL (ref 15–500)
Eosinophils Relative: 4.4 %
HCT: 38.9 % (ref 35.0–45.0)
Hemoglobin: 13 g/dL (ref 11.7–15.5)
Lymphs Abs: 1763 cells/uL (ref 850–3900)
MCH: 30.3 pg (ref 27.0–33.0)
MCHC: 33.4 g/dL (ref 32.0–36.0)
MCV: 90.7 fL (ref 80.0–100.0)
MPV: 12.1 fL (ref 7.5–12.5)
Monocytes Relative: 4.2 %
Neutro Abs: 5335 cells/uL (ref 1500–7800)
Neutrophils Relative %: 68.4 %
Platelets: 196 10*3/uL (ref 140–400)
RBC: 4.29 10*6/uL (ref 3.80–5.10)
RDW: 12.5 % (ref 11.0–15.0)
Total Lymphocyte: 22.6 %
WBC: 7.8 10*3/uL (ref 3.8–10.8)

## 2022-04-08 LAB — COMPLETE METABOLIC PANEL WITH GFR
AG Ratio: 1.7 (calc) (ref 1.0–2.5)
ALT: 12 U/L (ref 6–29)
AST: 14 U/L (ref 10–30)
Albumin: 4.2 g/dL (ref 3.6–5.1)
Alkaline phosphatase (APISO): 42 U/L (ref 31–125)
BUN: 7 mg/dL (ref 7–25)
CO2: 25 mmol/L (ref 20–32)
Calcium: 9 mg/dL (ref 8.6–10.2)
Chloride: 107 mmol/L (ref 98–110)
Creat: 0.71 mg/dL (ref 0.50–0.97)
Globulin: 2.5 g/dL (calc) (ref 1.9–3.7)
Glucose, Bld: 85 mg/dL (ref 65–99)
Potassium: 3.9 mmol/L (ref 3.5–5.3)
Sodium: 139 mmol/L (ref 135–146)
Total Bilirubin: 0.4 mg/dL (ref 0.2–1.2)
Total Protein: 6.7 g/dL (ref 6.1–8.1)
eGFR: 115 mL/min/{1.73_m2} (ref >=60)

## 2022-04-08 LAB — TSH: TSH: 0.61 mIU/L

## 2022-04-08 LAB — LIPID PANEL W/REFLEX DIRECT LDL
Cholesterol: 173 mg/dL (ref <200)
HDL: 64 mg/dL (ref >=50)
LDL Cholesterol (Calc): 98 mg/dL (calc)
Non-HDL Cholesterol (Calc): 109 mg/dL (calc) (ref <130)
Total CHOL/HDL Ratio: 2.7 (calc) (ref <5.0)
Triglycerides: 37 mg/dL (ref <150)

## 2022-04-08 LAB — CYTOLOGY - PAP: Diagnosis: NEGATIVE

## 2022-04-08 NOTE — Progress Notes (Signed)
Lalla,   Thyroid looks good.  Kidney, liver, glucose look good.  WBC and hemoglobin good.  Cholesterol has improved and looks fantastic.

## 2022-04-08 NOTE — Progress Notes (Signed)
Yeast detected and has already been treated.  Normal cells and negative for HPV.  Next pap in 3 years.

## 2022-04-30 ENCOUNTER — Encounter: Payer: Self-pay | Admitting: Physician Assistant

## 2022-05-02 ENCOUNTER — Encounter: Payer: Self-pay | Admitting: Physician Assistant

## 2022-06-06 ENCOUNTER — Other Ambulatory Visit (HOSPITAL_COMMUNITY): Payer: Self-pay

## 2022-06-09 ENCOUNTER — Other Ambulatory Visit (HOSPITAL_COMMUNITY): Payer: Self-pay

## 2022-07-06 ENCOUNTER — Telehealth: Payer: Medicaid Other | Admitting: Nurse Practitioner

## 2022-07-06 DIAGNOSIS — J029 Acute pharyngitis, unspecified: Secondary | ICD-10-CM

## 2022-07-06 NOTE — Progress Notes (Signed)
E-Visit for Sore Throat  We are sorry that you are not feeling well.  Here is how we plan to help!  Your symptoms indicate a likely viral infection (Pharyngitis).   Pharyngitis is inflammation in the back of the throat which can cause a sore throat, scratchiness and sometimes difficulty swallowing.   Pharyngitis is typically caused by a respiratory virus and will just run its course.  Please keep in mind that your symptoms could last up to 10 days.  For throat pain, we recommend over the counter oral pain relief medications such as acetaminophen or aspirin, or anti-inflammatory medications such as ibuprofen or naproxen sodium.  Topical treatments such as oral throat lozenges or sprays may be used as needed.  Avoid close contact with loved ones, especially the very young and elderly.  Remember to wash your hands thoroughly throughout the day as this is the number one way to prevent the spread of infection and wipe down door knobs and counters with disinfectant.  After careful review of your answers, I would not recommend an antibiotic for your condition.  Antibiotics should not be used to treat conditions that we suspect are caused by viruses like the virus that causes the common cold or flu. However, some people can have Strep with atypical symptoms. You may need formal testing in clinic or office to confirm if your symptoms continue or worsen.  Providers prescribe antibiotics to treat infections caused by bacteria. Antibiotics are very powerful in treating bacterial infections when they are used properly.  To maintain their effectiveness, they should be used only when necessary.  Overuse of antibiotics has resulted in the development of super bugs that are resistant to treatment!    Home Care: Only take medications as instructed by your medical team. Do not drink alcohol while taking these medications. A steam or ultrasonic humidifier can help congestion.  You can place a towel over your head and  breathe in the steam from hot water coming from a faucet. Avoid close contacts especially the very young and the elderly. Cover your mouth when you cough or sneeze. Always remember to wash your hands.  Get Help Right Away If: You develop worsening fever or throat pain. You develop a severe head ache or visual changes. Your symptoms persist after you have completed your treatment plan.  Make sure you Understand these instructions. Will watch your condition. Will get help right away if you are not doing well or get worse.   Thank you for choosing an e-visit.  Your e-visit answers were reviewed by a board certified advanced clinical practitioner to complete your personal care plan. Depending upon the condition, your plan could have included both over the counter or prescription medications.  Please review your pharmacy choice. Make sure the pharmacy is open so you can pick up prescription now. If there is a problem, you may contact your provider through [employer] and have the prescription routed to another pharmacy.  Your safety is important to [place]. If you have drug allergies check your prescription carefully.   For the next 24 hours you can use MyChart to ask questions about today's visit, request a non-urgent call back, or ask for a work or school excuse. You will get an email in the next two days asking about your experience. I hope that your e-visit has been valuable and will speed your recovery.   I spent approximately 5 minutes reviewing the patient's history, current symptoms and coordinating their plan of care today.

## 2022-07-07 ENCOUNTER — Encounter: Payer: Self-pay | Admitting: Emergency Medicine

## 2022-07-07 ENCOUNTER — Ambulatory Visit
Admission: EM | Admit: 2022-07-07 | Discharge: 2022-07-07 | Disposition: A | Discharge status: Home / Self Care | Payer: Medicaid Other | Attending: Urgent Care | Admitting: Urgent Care

## 2022-07-07 DIAGNOSIS — J029 Acute pharyngitis, unspecified: Secondary | ICD-10-CM | POA: Diagnosis not present

## 2022-07-07 DIAGNOSIS — R070 Pain in throat: Secondary | ICD-10-CM | POA: Insufficient documentation

## 2022-07-07 LAB — POCT RAPID STREP A (OFFICE): Rapid Strep A Screen: NEGATIVE

## 2022-07-07 MED ORDER — KETOROLAC TROMETHAMINE 60 MG/2ML IM SOLN
60.0000 mg | Freq: Once | INTRAMUSCULAR | Status: AC
  Administered 2022-07-07: 60 mg via INTRAMUSCULAR

## 2022-07-07 MED ORDER — ONDANSETRON 8 MG PO TBDP: 8.0000 mg | ORAL_TABLET | Freq: Three times a day (TID) | ORAL | 0 refills | Status: DC | PRN

## 2022-07-07 MED ORDER — AMOXICILLIN-POT CLAVULANATE 875-125 MG PO TABS: 1.0000 | ORAL_TABLET | Freq: Two times a day (BID) | ORAL | 0 refills | Status: DC

## 2022-07-07 MED ORDER — NAPROXEN 500 MG PO TABS: 500.0000 mg | ORAL_TABLET | Freq: Two times a day (BID) | ORAL | 0 refills | Status: DC

## 2022-07-07 NOTE — ED Triage Notes (Signed)
Pt is present today with c/o sore throat and right ear pain. Pt states sx started x3 days ago

## 2022-07-07 NOTE — ED Provider Notes (Signed)
[facility]  Note:  This document was prepared using Dragon voice recognition software and may include unintentional dictation errors.  MRN: [id number] DOB: [birth year]  Subjective:   Victoria Barnes is a 36 y.o. female presenting for 3 day history of acute onset persistent and worsening throat pain, painful swallowing, radiation of the pain into her neck and right ear.  Patient has had fevers as well.  She did 2 COVID tests at home and was negative.  No cough, chest pain, shortness of breath or wheezing.  The pain has now become severe, 10 out of 10 and is associated with nausea and vomiting.  Regarding her medication allergies, patient has previously taken amoxicillin without any issues.  No current facility-administered medications for this encounter.  Current Outpatient Medications:    amphetamine-dextroamphetamine (ADDERALL) 30 MG tablet, Take 1 tablet by mouth 2 (two) times daily., Disp: 60 tablet, Rfl: 0   amphetamine-dextroamphetamine (ADDERALL) 30 MG tablet, Take 1 tablet by mouth 2 (two) times daily., Disp: 50 tablet, Rfl: 0   amphetamine-dextroamphetamine (ADDERALL) 30 MG tablet, Take 1 tablet by mouth daily., Disp: 60 tablet, Rfl: 0   amphetamine-dextroamphetamine (ADDERALL) 30 MG tablet, Take 1 tablet by mouth 2 (two) times daily., Disp: 60 tablet, Rfl: 0   cholecalciferol (VITAMIN D3) 25 MCG (1000 UNIT) tablet, Take 1,000 Units by mouth daily., Disp: , Rfl:    clonazePAM (KLONOPIN) 1 MG tablet, Take 1 tablet (1 mg total) by mouth daily. As needed for acute anxiety and panic., Disp: 30 tablet, Rfl: 1   fexofenadine (ALLEGRA ALLERGY) 180 MG tablet, Take 1 tablet (180 mg total) by mouth daily., Disp: 90 tablet, Rfl: 3   fluticasone (FLONASE) 50 MCG/ACT nasal spray, USE 1 SPRAY IN EACH NOSTRIL ONCE A DAY, Disp: 16 g, Rfl: 3   Ginkgo Biloba 40 MG TABS, Take by mouth., Disp: , Rfl:    rizatriptan (MAXALT-MLT) 10 MG disintegrating tablet, TAKE 1 TABLET BY MOUTH AS NEEDED  FOR MIGRAINE. MAY REPEAT IN 2 HOURS IF NEEDED, Disp: 10 tablet, Rfl: 5   tretinoin (RETIN-A) 0.1 % cream, Apply topically at bedtime., Disp: 45 g, Rfl: 1   valACYclovir (VALTREX) 1000 MG tablet, TAKE 2 TABLETS TWICE A DAY FOR ONE DAY FOR OUTBREAK., Disp: 30 tablet, Rfl: 0   Allergies  Allergen Reactions   Lamictal [Lamotrigine] Rash    Possible SJS   Antacid [Alum & Mag Hydroxide-Simeth] Nausea And Vomiting   Keflex [Cephalexin] Nausea And Vomiting   Penicillins Nausea And Vomiting   Trintellix [Vortioxetine]     Swelling/numbness and tingling of extremities.    Viibryd [Vilazodone Hcl]     Abnormal Frightening Dreams    Past Medical History:  Diagnosis Date   ADHD    Bipolar I disorder (HCC)    Depression    Eczema 2012   Migraine without status migrainosus, not intractable 02/26/2016     Past Surgical History:  Procedure Laterality Date   BREAST ENHANCEMENT SURGERY Bilateral June 2015   WISDOM TOOTH EXTRACTION      Family History  Problem Relation Age of Onset   Hyperlipidemia Mother    Personality disorder Maternal Grandmother    Depression Maternal Grandmother    Mood Disorder Maternal Grandmother     Social History   Tobacco Use   Smoking status: Former    Types: Cigarettes    Quit date: 11/2014    Years since quitting: 7.6   Smokeless tobacco: Never   Tobacco comments:    quit  2 years  Vaping Use   Vaping Use: Never used  Substance Use Topics   Alcohol use: Yes    Comment: once or twice a month - socially   Drug use: No    ROS   Objective:   Vitals: BP 112/74   Pulse (!) 121   Temp 100.3 F (37.9 C)   Resp 19   SpO2 98%   Physical Exam Constitutional:      General: She is not in acute distress.    Appearance: Normal appearance. She is well-developed. She is not ill-appearing, toxic-appearing or diaphoretic.  HENT:     Head: Normocephalic and atraumatic.     Right Ear: Tympanic membrane, ear canal and external ear normal. No tenderness.  There is no impacted cerumen. Tympanic membrane is not injected, perforated, erythematous or bulging.     Left Ear: Tympanic membrane, ear canal and external ear normal. No tenderness. There is no impacted cerumen. Tympanic membrane is not injected, perforated, erythematous or bulging.     Nose: Nose normal.     Mouth/Throat:     Mouth: Mucous membranes are moist.     Pharynx: Pharyngeal swelling, posterior oropharyngeal erythema and uvula swelling present. No oropharyngeal exudate.     Tonsils: No tonsillar exudate or tonsillar abscesses. 2+ on the right. 0 on the left.   Eyes:     General: No scleral icterus.       Right eye: No discharge.        Left eye: No discharge.     Extraocular Movements: Extraocular movements intact.  Cardiovascular:     Rate and Rhythm: Normal rate.  Pulmonary:     Effort: Pulmonary effort is normal.  Skin:    General: Skin is warm and dry.  Neurological:     General: No focal deficit present.     Mental Status: She is alert and oriented to person, place, and time.  Psychiatric:        Mood and Affect: Mood normal.        Behavior: Behavior normal.     Results for orders placed or performed during the hospital encounter of 07/07/22 (from the past 24 hour(s))  POCT rapid strep A     Status: None   Collection Time: 07/07/22  9:52 AM  Result Value Ref Range   Rapid Strep A Screen Negative Negative    Assessment and Plan :   PDMP not reviewed this encounter.  1. Acute pharyngitis, unspecified etiology   2. Throat pain    Discussed possibility of a peritonsillar abscess with patient.  For now we will use empiric treatment for acute pharyngitis with Augmentin.  IM Toradol done in clinic for her severe pain.  Use naproxen as an outpatient.  Supportive care with Tylenol and Zofran as needed.  We will obtain a strep culture. Counseled patient on potential for adverse effects with medications prescribed/recommended today, ER and return-to-clinic  precautions discussed, patient verbalized understanding.    Wallis Bamberg, PA-C 07/07/22 1022

## 2022-07-10 LAB — CULTURE, GROUP A STREP (THRC)

## 2022-07-11 ENCOUNTER — Encounter: Payer: Self-pay | Admitting: Physician Assistant

## 2022-07-11 ENCOUNTER — Other Ambulatory Visit (HOSPITAL_COMMUNITY): Payer: Self-pay

## 2022-07-11 ENCOUNTER — Ambulatory Visit: Payer: Medicaid Other | Admitting: Physician Assistant

## 2022-07-11 VITALS — BP 123/73 | HR 78 | Temp 96.7°F | Wt 130.0 lb

## 2022-07-11 DIAGNOSIS — F3181 Bipolar II disorder: Secondary | ICD-10-CM | POA: Diagnosis not present

## 2022-07-11 DIAGNOSIS — J029 Acute pharyngitis, unspecified: Secondary | ICD-10-CM

## 2022-07-11 DIAGNOSIS — Z7251 High risk heterosexual behavior: Secondary | ICD-10-CM | POA: Diagnosis not present

## 2022-07-11 DIAGNOSIS — F901 Attention-deficit hyperactivity disorder, predominantly hyperactive type: Secondary | ICD-10-CM

## 2022-07-11 DIAGNOSIS — F909 Attention-deficit hyperactivity disorder, unspecified type: Secondary | ICD-10-CM

## 2022-07-11 DIAGNOSIS — R591 Generalized enlarged lymph nodes: Secondary | ICD-10-CM | POA: Diagnosis not present

## 2022-07-11 MED ORDER — AMPHETAMINE-DEXTROAMPHETAMINE 30 MG PO TABS
30.0000 mg | ORAL_TABLET | Freq: Two times a day (BID) | ORAL | 0 refills | Status: DC
  Filled 2022-11-28: qty 60, 30d supply, fill #0

## 2022-07-11 MED ORDER — PREDNISONE 20 MG PO TABS
20.0000 mg | ORAL_TABLET | Freq: Two times a day (BID) | ORAL | 0 refills | Status: DC
  Filled 2022-07-11: qty 10, 5d supply, fill #0

## 2022-07-11 MED ORDER — AMPHETAMINE-DEXTROAMPHETAMINE 30 MG PO TABS
30.0000 mg | ORAL_TABLET | Freq: Every day | ORAL | 0 refills | Status: DC
  Filled 2022-07-11: qty 60, 60d supply, fill #0

## 2022-07-11 MED ORDER — CLINDAMYCIN HCL 300 MG PO CAPS
300.0000 mg | ORAL_CAPSULE | Freq: Two times a day (BID) | ORAL | 0 refills | Status: DC
  Filled 2022-07-11: qty 14, 7d supply, fill #0

## 2022-07-11 MED ORDER — AMPHETAMINE-DEXTROAMPHETAMINE 30 MG PO TABS: 30.0000 mg | ORAL_TABLET | Freq: Two times a day (BID) | ORAL | 0 refills | Status: DC

## 2022-07-11 NOTE — Progress Notes (Signed)
   Established Patient Office Visit  Subjective   Patient ID: Victoria Barnes, female    DOB: 22-Jun-1986  Age: 36 y.o. MRN: [id number]  Chief Complaint  Patient presents with  . Ear Pain    Following up from urgent care for neck and ear pain and wanted to do a routine std screening.    HPI   ROS See HPI.    Objective:     BP 123/73   Pulse 78   Temp (!) 96.7 F (35.9 C) (Oral)   Wt 130 lb (59 kg)   SpO2 100%   BMI 26.26 kg/m  BP Readings from Last 3 Encounters:  07/11/22 123/73  07/07/22 112/74  04/07/22 112/77   Wt Readings from Last 3 Encounters:  07/11/22 130 lb (59 kg)  04/07/22 132 lb (59.9 kg)  03/10/22 132 lb (59.9 kg)      Physical Exam       Assessment & Plan:  Marland KitchenMarland KitchenCozette was seen today for ear pain.  Diagnoses and all orders for this visit:  Bipolar 2 disorder (HCC) -     Ambulatory referral to Psychology  Lymphadenopathy -     HIV antibody (with reflex) -     RPR -     CBC w/Diff/Platelet -     BASIC METABOLIC PANEL WITH GFR -     predniSONE (DELTASONE) 20 MG tablet; Take 1 tablet (20 mg total) by mouth 2 (two) times daily with a meal.  Acute pharyngitis, unspecified etiology -     HIV antibody (with reflex) -     RPR -     CBC w/Diff/Platelet -     BASIC METABOLIC PANEL WITH GFR -     predniSONE (DELTASONE) 20 MG tablet; Take 1 tablet (20 mg total) by mouth 2 (two) times daily with a meal.  High risk heterosexual behavior -     HIV antibody (with reflex) -     RPR -     Ambulatory referral to Psychology -     SureSwab Advanced Vaginitis Plus,TMA  Attention deficit hyperactivity disorder (ADHD), predominantly hyperactive type -     amphetamine-dextroamphetamine (ADDERALL) 30 MG tablet; Take 1 tablet by mouth 2 (two) times daily. -     amphetamine-dextroamphetamine (ADDERALL) 30 MG tablet; Take 1 tablet by mouth 2 (two) times daily. -     amphetamine-dextroamphetamine (ADDERALL) 30 MG tablet; Take 1 tablet by mouth  daily.  Attention deficit hyperactivity disorder (ADHD), unspecified ADHD type -     amphetamine-dextroamphetamine (ADDERALL) 30 MG tablet; Take 1 tablet by mouth 2 (two) times daily.  Other orders -     clindamycin (CLEOCIN) 300 MG capsule; Take 1 capsule (300 mg total) by mouth 2 (two) times daily.      Tandy Gaw, PA-C

## 2022-07-12 LAB — SURESWAB® ADVANCED VAGINITIS PLUS,TMA
C. trachomatis RNA, TMA: NOT DETECTED
CANDIDA SPECIES: NOT DETECTED
Candida glabrata: NOT DETECTED
N. gonorrhoeae RNA, TMA: NOT DETECTED
SURESWAB(R) ADV BACTERIAL VAGINOSIS(BV),TMA: NEGATIVE
TRICHOMONAS VAGINALIS (TV),TMA: NOT DETECTED

## 2022-07-14 LAB — CBC WITH DIFFERENTIAL/PLATELET
Absolute Monocytes: 663 cells/uL (ref 200–950)
Basophils Absolute: 125 cells/uL (ref 0–200)
Basophils Relative: 1 %
Eosinophils Absolute: 113 cells/uL (ref 15–500)
Eosinophils Relative: 0.9 %
HCT: 39.8 % (ref 35.0–45.0)
Hemoglobin: 13.1 g/dL (ref 11.7–15.5)
Lymphs Abs: 3913 cells/uL — ABNORMAL HIGH (ref 850–3900)
MCH: 30 pg (ref 27.0–33.0)
MCHC: 32.9 g/dL (ref 32.0–36.0)
MCV: 91.1 fL (ref 80.0–100.0)
MPV: 10.7 fL (ref 7.5–12.5)
Monocytes Relative: 5.3 %
Neutro Abs: 7688 cells/uL (ref 1500–7800)
Neutrophils Relative %: 61.5 %
Platelets: 297 10*3/uL (ref 140–400)
RBC: 4.37 10*6/uL (ref 3.80–5.10)
RDW: 13.1 % (ref 11.0–15.0)
Total Lymphocyte: 31.3 %
WBC: 12.5 10*3/uL — ABNORMAL HIGH (ref 3.8–10.8)

## 2022-07-14 LAB — BASIC METABOLIC PANEL WITH GFR
BUN: 12 mg/dL (ref 7–25)
CO2: 25 mmol/L (ref 20–32)
Calcium: 9.3 mg/dL (ref 8.6–10.2)
Chloride: 100 mmol/L (ref 98–110)
Creat: 0.77 mg/dL (ref 0.50–0.97)
Glucose, Bld: 78 mg/dL (ref 65–99)
Potassium: 4.3 mmol/L (ref 3.5–5.3)
Sodium: 135 mmol/L (ref 135–146)
eGFR: 104 mL/min/{1.73_m2} (ref >=60)

## 2022-07-14 LAB — HIV ANTIBODY (ROUTINE TESTING W REFLEX): HIV 1&2 Ab, 4th Generation: NONREACTIVE

## 2022-07-14 LAB — RPR: RPR Ser Ql: NONREACTIVE

## 2022-07-14 NOTE — Progress Notes (Signed)
Victoria Barnes,   How are you feeling? WBC indicating infection.   Negative for STIs so far. HIV is still pending.

## 2022-07-14 NOTE — Addendum Note (Signed)
Addended by: Jomarie Longs on: 07/14/2022 04:34 PM   Modules accepted: Orders

## 2022-07-15 NOTE — Progress Notes (Signed)
HIV and RPR negative.  I did order neck xray for you to have done.

## 2022-08-27 ENCOUNTER — Ambulatory Visit: Payer: Medicaid Other | Admitting: Physician Assistant

## 2022-08-27 ENCOUNTER — Encounter: Payer: Self-pay | Admitting: Physician Assistant

## 2022-08-27 ENCOUNTER — Other Ambulatory Visit (HOSPITAL_COMMUNITY): Payer: Self-pay

## 2022-08-27 VITALS — BP 113/63 | HR 79 | Temp 98.7°F | Ht 59.0 in | Wt 130.0 lb

## 2022-08-27 DIAGNOSIS — F901 Attention-deficit hyperactivity disorder, predominantly hyperactive type: Secondary | ICD-10-CM

## 2022-08-27 DIAGNOSIS — L731 Pseudofolliculitis barbae: Secondary | ICD-10-CM

## 2022-08-27 DIAGNOSIS — F3181 Bipolar II disorder: Secondary | ICD-10-CM

## 2022-08-27 DIAGNOSIS — B009 Herpesviral infection, unspecified: Secondary | ICD-10-CM | POA: Diagnosis not present

## 2022-08-27 DIAGNOSIS — J302 Other seasonal allergic rhinitis: Secondary | ICD-10-CM | POA: Diagnosis not present

## 2022-08-27 MED ORDER — VALACYCLOVIR HCL 1 G PO TABS
ORAL_TABLET | ORAL | 1 refills | Status: DC
  Filled 2022-08-27: qty 30, 7d supply, fill #0
  Filled 2023-08-06: qty 30, 7d supply, fill #1

## 2022-08-27 MED ORDER — FLUTICASONE PROPIONATE 50 MCG/ACT NA SUSP
NASAL | 3 refills | Status: DC
  Filled 2022-08-27: qty 16, 60d supply, fill #0
  Filled 2022-11-20 – 2022-11-28 (×2): qty 16, 60d supply, fill #1
  Filled 2023-02-09: qty 16, 60d supply, fill #2

## 2022-08-27 NOTE — Patient Instructions (Signed)
Ingrown Hair  An ingrown hair is a hair that curls and re-enters the skin instead of growing straight out of the skin. An ingrown hair can develop in any part of the skin that hair is removed from. An ingrown hair may cause small pockets of infection. What are the causes? An ingrown hair may be caused by: Shaving. Tweezing. Waxing. Using a hair removal cream. What increases the risk? You are more likely to develop this condition if you have curly hair. What are the signs or symptoms? Symptoms of an ingrown hair may include: Small bumps on the skin. The bumps may be filled with pus. Pain. Itching. How is this diagnosed? An ingrown hair is diagnosed by a skin exam. How is this treated? Treatment is often not needed unless the ingrown hair has caused an infection. If needed, treatment may include: Applying prescription creams to the skin. This can help with inflammation. Applying warm compresses to the skin. This can help soften the skin. Taking antibiotic medicine. An antibiotic may be prescribed if the infection is severe. Retracting and releasing the ingrown hair tips. Hair removal by electrolysis or laser. Follow these instructions at home: Medicines Take, apply, or use over-the-counter and prescription medicines only as told by your health care provider. This includes any prescription creams. If you were prescribed an antibiotic medicine, take it as told by your health care provider. Do not stop using the antibiotic even if you start to feel better. General instructions Do not shave irritated areas of skin. You may start shaving these areas again once the irritation has gone away. To help remove ingrown hairs on your face, you may use a facial sponge in a gentle circular motion. Do not pick or squeeze the irritated area of skin as this may cause infection and scarring. Use a hair removal cream as told by your health care provider. Managing pain and swelling  If directed, apply  heat to the affected area as often as told by your health care provider. Use the heat source that your health care provider recommends, such as a moist heat pack or a heating pad. Place a towel between your skin and the heat source. Leave the heat on for 20-30 minutes. If your skin turns bright red, remove the heat right away to prevent burns. The risk of burns is higher if you cannot feel pain, heat, or cold. How is this prevented? Shower before shaving. Wrap areas that you are going to shave in warm, moist wraps for several minutes before shaving. The warmth and moisture help to soften the hairs and makes ingrown hairs less likely. Use thick shaving gels. Use a razor that cuts hair slightly above your skin, or use an electric shaver with a long shave setting. Shave in the direction of hair growth. Avoid making multiple razor strokes. Apply a moisturizing lotion after shaving. Summary An ingrown hair is a hair that curls and re-enters the skin instead of growing straight out of the skin. Treatment is often not needed unless the ingrown hair has caused an infection. Take, apply, or use over-the-counter and prescription medicines only as told by your health care provider. This includes any prescription creams. Do not shave irritated areas of skin. You may start shaving these areas again once the irritation has gone away. If directed, apply heat to the affected area. Use the heat source that your health care provider recommends, such as a moist heat pack or a heating pad. This information is not intended to replace   advice given to you by your health care provider. Make sure you discuss any questions you have with your health care provider. Document Revised: 12/11/2021 Document Reviewed: 12/11/2021 Elsevier Patient Education  2023 Elsevier Inc.  

## 2022-08-29 ENCOUNTER — Other Ambulatory Visit (HOSPITAL_COMMUNITY): Payer: Self-pay

## 2022-08-29 ENCOUNTER — Encounter: Payer: Self-pay | Admitting: Physician Assistant

## 2022-08-29 MED ORDER — AMPHETAMINE-DEXTROAMPHETAMINE 30 MG PO TABS: 30.0000 mg | ORAL_TABLET | Freq: Two times a day (BID) | ORAL | 0 refills | Status: DC

## 2022-08-29 MED ORDER — AMPHETAMINE-DEXTROAMPHETAMINE 30 MG PO TABS
30.0000 mg | ORAL_TABLET | Freq: Every day | ORAL | 0 refills | Status: DC
  Filled 2022-11-12: qty 60, 60d supply, fill #0
  Filled 2022-11-14 – 2022-11-20 (×2): qty 30, 30d supply, fill #0
  Filled 2022-11-28: qty 34, 34d supply, fill #0

## 2022-08-29 NOTE — Progress Notes (Signed)
Acute Office Visit  Subjective:     Patient ID: Victoria Barnes, female    DOB: 10/10/1986, 36 y.o.   MRN: [id number]  Chief Complaint  Patient presents with   Skin Problem    HPI Patient is in today for ingrown hairs of the upper medial bilateral legs. She had one removed a few weeks ago and now another has formed. No fever, chills, tenderness.   Needs valtrex and flonase and adderall refilled. Doing well with all medications.   .. 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   Paresthesias 09/10/2016   Fatigue 02/23/2017   Bipolar 2 disorder ([place]) 07/07/2017   Dehydration 12/16/2017   Seasonal allergies 03/14/2018   Anxiety 09/01/2018   Acute stress reaction 02/07/2019   Tachycardia 05/02/2019   Alopecia 05/02/2019   Vaginal irritation 04/07/2022   Resolved Ambulatory Problems    Diagnosis Date Noted   Vitamin D deficiency 06/20/2015   Depression 07/17/2015   Abnormal weight gain 07/19/2015   Pain in joint, ankle and foot 08/02/2015   Exposure to strep throat 09/07/2015   Viral gastroenteritis 09/13/2015   Abscess of skin 09/13/2015   Pharyngitis 11/06/2015   Migraine without aura and with status migrainosus, not intractable 05/22/2016   Insomnia 05/22/2016   Nausea without vomiting 05/24/2016   Severe recurrent major depression without psychotic features ([place]) 06/24/2016   Dehydration 08/06/2016   Episodic mood disorder ([place]) 11/11/2016   Influenza-like illness 12/11/2016   Side effects of treatment 02/23/2017   Past Medical History:  Diagnosis Date   ADHD    Bipolar I disorder ([place])      ROS  See HPI.     Objective:    BP 113/63   Pulse 79   Temp 98.7 F (37.1 C)  (Oral)   Ht 4\' 11"  (1.499 m)   Wt 130 lb (59 kg)   SpO2 100%   BMI 26.26 kg/m    Physical Exam Cardiovascular:     Rate and Rhythm: Normal rate.  Pulmonary:     Effort: Pulmonary effort is normal.  Skin:    Comments: Bilateral medial inner upper thighs 2 areas of ingrown hairs and slightly tender papules.  Cleaned with hibclens and opened up and hair and blood material obtained.   Neurological:     General: No focal deficit present.     Mental Status: She is oriented to person, place, and time.  Psychiatric:        Mood and Affect: Mood normal.          Assessment & Plan:   Marland KitchenMarland KitchenJaine was seen today for skin problem.  Diagnoses and all orders for this visit:  Ingrown hair  HSV-2 infection -     valACYclovir (VALTREX) 1000 MG tablet; TAKE 2 TABLETS TWICE A DAY FOR ONE DAY FOR OUTBREAK.  Seasonal allergies -     fluticasone (FLONASE) 50 MCG/ACT nasal spray; USE 1 SPRAY IN EACH NOSTRIL ONCE A DAY  Attention deficit hyperactivity disorder (ADHD), predominantly hyperactive type -     amphetamine-dextroamphetamine (ADDERALL) 30 MG tablet; Take 1 tablet by mouth daily. -     amphetamine-dextroamphetamine (ADDERALL) 30 MG tablet; Take 1 tablet by mouth 2 (two) times daily.   Refilled medications.  Removed ingrown hair. Discussed prevention.     Iran Planas, PA-C

## 2022-09-05 ENCOUNTER — Other Ambulatory Visit (HOSPITAL_COMMUNITY): Payer: Self-pay

## 2022-09-12 ENCOUNTER — Other Ambulatory Visit (HOSPITAL_COMMUNITY): Payer: Self-pay

## 2022-09-13 ENCOUNTER — Other Ambulatory Visit (HOSPITAL_COMMUNITY): Payer: Self-pay

## 2022-11-12 ENCOUNTER — Telehealth: Payer: Medicaid Other | Admitting: Physician Assistant

## 2022-11-12 ENCOUNTER — Other Ambulatory Visit: Payer: Self-pay

## 2022-11-12 ENCOUNTER — Other Ambulatory Visit (HOSPITAL_COMMUNITY): Payer: Self-pay

## 2022-11-12 ENCOUNTER — Encounter: Payer: Self-pay | Admitting: Physician Assistant

## 2022-11-12 VITALS — Ht 59.0 in | Wt 128.0 lb

## 2022-11-12 DIAGNOSIS — F331 Major depressive disorder, recurrent, moderate: Secondary | ICD-10-CM | POA: Insufficient documentation

## 2022-11-12 DIAGNOSIS — F901 Attention-deficit hyperactivity disorder, predominantly hyperactive type: Secondary | ICD-10-CM

## 2022-11-12 DIAGNOSIS — F3181 Bipolar II disorder: Secondary | ICD-10-CM

## 2022-11-12 MED ORDER — CARIPRAZINE HCL 1.5 MG PO CAPS
1.5000 mg | ORAL_CAPSULE | Freq: Every day | ORAL | 1 refills | Status: DC
  Filled 2022-11-12 – 2022-11-14 (×2): qty 30, 30d supply, fill #0
  Filled 2022-12-09: qty 30, 30d supply, fill #1

## 2022-11-12 NOTE — Progress Notes (Signed)
..Virtual Visit via Video Note  I connected with Victoria Barnes on 11/12/22 at  8:10 AM EST by a video enabled telemedicine application and verified that I am speaking with the correct person using two identifiers.  Location: Patient: home in the bed Provider: clinic  .Marland KitchenParticipating in visit:  Patient: Victoria Barnes Provider: Iran Planas PA-C   I discussed the limitations of evaluation and management by telemedicine and the availability of in person appointments. The patient expressed understanding and agreed to proceed.  History of Present Illness: Pt is a 37 yo female who Bipolar 2, ADHD, MDD who calls in to talk about medication. She has not tried medication for depression in a long while. She has not had any recent mania episodes. She has had about 2 months of dark depression. No SI/HC but does feel like she would be better off "not here". No plans. She has no motivation. She is not working. She has not left house since Victoria Barnes. She is not paying any bills. Her son was in hospital early December with sickle cell and complications. She feels like this triggered her depression.   Pt has not gotten appt yet for counseling and she would like to get in [place] if at all possible.   .. 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   Paresthesias 09/10/2016   Fatigue 02/23/2017   Bipolar 2 disorder ([place]) 07/07/2017   Dehydration 12/16/2017   Seasonal allergies 03/14/2018   Anxiety 09/01/2018   Acute stress reaction 02/07/2019   Tachycardia 05/02/2019   Alopecia 05/02/2019   Vaginal irritation 04/07/2022   Moderate episode of recurrent major depressive disorder ([place]) 11/12/2022   Resolved Ambulatory Problems     Diagnosis Date Noted   Vitamin D deficiency 06/20/2015   Depression 07/17/2015   Abnormal weight gain 07/19/2015   Pain in joint, ankle and foot 08/02/2015   Exposure to strep throat 09/07/2015   Viral gastroenteritis 09/13/2015   Abscess of skin 09/13/2015   Pharyngitis 11/06/2015   Migraine without aura and with status migrainosus, not intractable 05/22/2016   Insomnia 05/22/2016   Nausea without vomiting 05/24/2016   Severe recurrent major depression without psychotic features ([place]) 06/24/2016   Dehydration 08/06/2016   Episodic mood disorder ([place]) 11/11/2016   Influenza-like illness 12/11/2016   Side effects of treatment 02/23/2017   Past Medical History:  Diagnosis Date   ADHD    Bipolar I disorder ([place])      Observations/Objective: No acute distress Laying in bed with flat affect  .[name] Today's Vitals   11/12/22 0748  Weight: 128 lb (58.1 kg)  Height: 4\' 11"  (1.499 m)   Body mass index is 25.85 kg/m.  ..    11/12/2022    7:49 AM 07/11/2022   10:26 AM 03/10/2022    3:02 PM 06/05/2021    8:40 AM 03/27/2020    1:10 PM  Depression screen PHQ 2/9  Decreased Interest 3 0 0 0 0  Down, Depressed, Hopeless 3 0 0 0 0  PHQ - 2 Score 6 0 0 0 0  Altered sleeping 3    0  Tired, decreased energy 3    0  Change in appetite 0    0  Feeling bad or failure about yourself  3  0  Trouble concentrating 3    0  Moving slowly or fidgety/restless 0    0  Suicidal thoughts 1    0  PHQ-9 Score 19    0  Difficult doing work/chores Extremely dIfficult    Not difficult at all   ..    11/12/2022    7:50 AM 03/27/2020    1:11 PM 12/13/2019    1:54 PM 04/26/2019    1:09 PM  GAD 7 : Generalized Anxiety Score  Nervous, Anxious, on Edge 1 0 2 0  Control/stop worrying 3 0 3 0  Worry too much - different things 3 0 3 0  Trouble relaxing 0 0 1 0  Restless 0 0 0 0  Easily annoyed or irritable 3 0 0 2  Afraid - awful might happen 3 0 0 0  Total GAD 7 Score 13 0 9 2  Anxiety Difficulty  Extremely difficult Not difficult at all  Not difficult at all       Assessment and Plan: Marland KitchenMarland KitchenMeggan was seen today for mental health problem.  Diagnoses and all orders for this visit:  Bipolar 2 disorder ([place]) -     Ambulatory referral to Psychology -     cariprazine (VRAYLAR) 1.5 MG capsule; Take 1 capsule (1.5 mg total) by mouth daily.  Attention deficit hyperactivity disorder (ADHD), predominantly hyperactive type -     Ambulatory referral to Psychology -     cariprazine (VRAYLAR) 1.5 MG capsule; Take 1 capsule (1.5 mg total) by mouth daily.  Moderate episode of recurrent major depressive disorder ([place]) -     Ambulatory referral to Psychology -     cariprazine (VRAYLAR) 1.5 MG capsule; Take 1 capsule (1.5 mg total) by mouth daily.   Failed numerous SSRIs, Abilify, lamictal, seroquel.  PHQ/GAD both increased Sent vryalar to start.  Discussed side effects Follow up in 4-6 weeks New referral to counseling    Follow Up Instructions:    I discussed the assessment and treatment plan with the patient. The patient was provided an opportunity to ask questions and all were answered. The patient agreed with the plan and demonstrated an understanding of the instructions.   The patient was advised to call back or seek an in-person evaluation if the symptoms worsen or if the condition fails to improve as anticipated.   Iran Planas, PA-C

## 2022-11-12 NOTE — Progress Notes (Signed)
PHQ9 (19) -GAD7 (13) completed.   Discuss mental health

## 2022-11-13 ENCOUNTER — Encounter: Payer: Self-pay | Admitting: Physician Assistant

## 2022-11-14 ENCOUNTER — Other Ambulatory Visit (HOSPITAL_COMMUNITY): Payer: Self-pay

## 2022-11-14 ENCOUNTER — Telehealth: Payer: Self-pay

## 2022-11-14 NOTE — Telephone Encounter (Signed)
Initiated Prior authorization [employer]:[id number] 1.5MG  capsules Via: Covermymeds Case/Key:[id number] Status: approved as of 11/14/21 Reason: Coverage Starts on: 11/14/2022 12:00:00 AM, Coverage Ends on: 11/14/2023 12:00:00 AM. Notified Pt via: Mychart

## 2022-11-18 DIAGNOSIS — F3132 Bipolar disorder, current episode depressed, moderate: Secondary | ICD-10-CM | POA: Diagnosis not present

## 2022-11-19 ENCOUNTER — Other Ambulatory Visit: Payer: Self-pay

## 2022-11-20 ENCOUNTER — Other Ambulatory Visit (HOSPITAL_COMMUNITY): Payer: Self-pay

## 2022-11-25 ENCOUNTER — Encounter (HOSPITAL_COMMUNITY): Payer: Self-pay

## 2022-11-25 ENCOUNTER — Other Ambulatory Visit: Payer: Self-pay

## 2022-11-25 ENCOUNTER — Other Ambulatory Visit (HOSPITAL_COMMUNITY): Payer: Self-pay

## 2022-11-28 ENCOUNTER — Encounter: Payer: Self-pay | Admitting: Physician Assistant

## 2022-11-28 ENCOUNTER — Other Ambulatory Visit: Payer: Self-pay

## 2022-11-28 ENCOUNTER — Other Ambulatory Visit (HOSPITAL_COMMUNITY): Payer: Self-pay

## 2022-11-28 DIAGNOSIS — F901 Attention-deficit hyperactivity disorder, predominantly hyperactive type: Secondary | ICD-10-CM

## 2022-11-28 MED ORDER — AMPHETAMINE-DEXTROAMPHETAMINE 30 MG PO TABS
30.0000 mg | ORAL_TABLET | Freq: Two times a day (BID) | ORAL | 0 refills | Status: DC
  Filled 2022-11-28: qty 60, 30d supply, fill #0

## 2022-11-28 MED ORDER — AMPHETAMINE-DEXTROAMPHETAMINE 30 MG PO TABS
30.0000 mg | ORAL_TABLET | Freq: Two times a day (BID) | ORAL | 0 refills | Status: DC
  Filled 2023-01-01: qty 60, 30d supply, fill #0

## 2022-12-01 ENCOUNTER — Other Ambulatory Visit: Payer: Self-pay

## 2022-12-01 ENCOUNTER — Other Ambulatory Visit (HOSPITAL_COMMUNITY): Payer: Self-pay

## 2022-12-02 DIAGNOSIS — F3132 Bipolar disorder, current episode depressed, moderate: Secondary | ICD-10-CM | POA: Diagnosis not present

## 2022-12-04 ENCOUNTER — Telehealth: Payer: Medicaid Other | Admitting: Physician Assistant

## 2022-12-04 DIAGNOSIS — R3989 Other symptoms and signs involving the genitourinary system: Secondary | ICD-10-CM

## 2022-12-04 MED ORDER — NITROFURANTOIN MONOHYD MACRO 100 MG PO CAPS: 100.0000 mg | ORAL_CAPSULE | Freq: Two times a day (BID) | ORAL | 0 refills | Status: DC

## 2022-12-04 NOTE — Progress Notes (Signed)
I have spent 5 minutes in review of e-visit questionnaire, review and updating patient chart, medical decision making and response to patient.   Oretta Berkland Cody Maria Gallicchio, PA-C    

## 2022-12-04 NOTE — Progress Notes (Signed)

## 2022-12-09 ENCOUNTER — Other Ambulatory Visit: Payer: Self-pay | Admitting: Physician Assistant

## 2022-12-09 ENCOUNTER — Other Ambulatory Visit (HOSPITAL_COMMUNITY): Payer: Self-pay

## 2022-12-09 ENCOUNTER — Other Ambulatory Visit: Payer: Self-pay

## 2022-12-09 ENCOUNTER — Telehealth: Payer: Medicaid Other | Admitting: Physician Assistant

## 2022-12-09 ENCOUNTER — Encounter: Payer: Self-pay | Admitting: Physician Assistant

## 2022-12-09 DIAGNOSIS — F901 Attention-deficit hyperactivity disorder, predominantly hyperactive type: Secondary | ICD-10-CM

## 2022-12-09 DIAGNOSIS — R451 Restlessness and agitation: Secondary | ICD-10-CM

## 2022-12-09 DIAGNOSIS — R21 Rash and other nonspecific skin eruption: Secondary | ICD-10-CM | POA: Diagnosis not present

## 2022-12-09 DIAGNOSIS — F3132 Bipolar disorder, current episode depressed, moderate: Secondary | ICD-10-CM | POA: Diagnosis not present

## 2022-12-09 DIAGNOSIS — F3181 Bipolar II disorder: Secondary | ICD-10-CM | POA: Diagnosis not present

## 2022-12-09 DIAGNOSIS — T887XXA Unspecified adverse effect of drug or medicament, initial encounter: Secondary | ICD-10-CM

## 2022-12-09 DIAGNOSIS — F331 Major depressive disorder, recurrent, moderate: Secondary | ICD-10-CM

## 2022-12-09 DIAGNOSIS — F419 Anxiety disorder, unspecified: Secondary | ICD-10-CM

## 2022-12-09 MED ORDER — EUCRISA 2 % EX OINT
1.0000 | TOPICAL_OINTMENT | CUTANEOUS | 1 refills | Status: DC | PRN
  Filled 2022-12-09: qty 100, 30d supply, fill #0

## 2022-12-09 MED ORDER — PROPRANOLOL HCL 10 MG PO TABS
ORAL_TABLET | ORAL | 0 refills | Status: DC
  Filled 2022-12-09: qty 49, 6d supply, fill #0
  Filled 2022-12-09: qty 90, 10d supply, fill #0
  Filled 2022-12-09: qty 41, 4d supply, fill #0
  Filled 2022-12-09: qty 90, 10d supply, fill #0

## 2022-12-09 NOTE — Progress Notes (Signed)
..Virtual Visit via Video Note  I connected with Victoria Barnes on 12/09/22 at 11:10 AM EST by a video enabled telemedicine application and verified that I am speaking with the correct person using two identifiers.  Location: Patient: home Provider: clinic  .Marland KitchenParticipating in visit:  Patient: Victoria Barnes Provider: Iran Planas PA-C    I discussed the limitations of evaluation and management by telemedicine and the availability of in person appointments. The patient expressed understanding and agreed to proceed.  History of Present Illness: Pt is a 37 yo female who calls into the clinic to discuss vryalar for mood. She has done really well with this and feels like "she is getting her life back". She denies any major depression or anxiety but does have some lack of motivation and just "not caring".  She has developed a skin rash on left arm and restlessness since starting. She has used klonapin for restlessness because usually in the evenings. Using eucrisa for rash and seems to be helping. Does not want to stop vraylar.   .. 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   Paresthesias 09/10/2016   Fatigue 02/23/2017   Bipolar 2 disorder ([place]) 07/07/2017   Dehydration 12/16/2017   Seasonal allergies 03/14/2018   Anxiety 09/01/2018   Acute stress reaction 02/07/2019   Tachycardia 05/02/2019   Alopecia 05/02/2019   Vaginal irritation 04/07/2022   Moderate episode of recurrent major depressive disorder ([place]) 11/12/2022   Restlessness 12/09/2022   Skin rash 12/09/2022   Resolved Ambulatory Problems    Diagnosis Date Noted   Vitamin D deficiency 06/20/2015   Depression 07/17/2015   Abnormal weight gain 07/19/2015    Pain in joint, ankle and foot 08/02/2015   Exposure to strep throat 09/07/2015   Viral gastroenteritis 09/13/2015   Abscess of skin 09/13/2015   Pharyngitis 11/06/2015   Migraine without aura and with status migrainosus, not intractable 05/22/2016   Insomnia 05/22/2016   Nausea without vomiting 05/24/2016   Severe recurrent major depression without psychotic features ([place]) 06/24/2016   Dehydration 08/06/2016   Episodic mood disorder ([place]) 11/11/2016   Influenza-like illness 12/11/2016   Side effects of treatment 02/23/2017   Past Medical History:  Diagnosis Date   ADHD    Bipolar I disorder ([place])        Observations/Objective: No acute distress Papular rash on left forearm Normal breathing Normal mood and appearance   Assessment and Plan: Marland KitchenMarland KitchenDiagnoses and all orders for this visit:  Bipolar 2 disorder ([place])  Attention deficit hyperactivity disorder (ADHD), predominantly hyperactive type  Moderate episode of recurrent major depressive disorder (HCC)  Restlessness -     propranolol (INDERAL) 10 MG tablet; Take 1-3 tablets as needed up to three times a day for restlessness.  Skin rash -     Crisaborole (EUCRISA) 2 % OINT; Apply 1 Application topically as needed.  Medication side effects -     propranolol (INDERAL) 10 MG tablet; Take 1-3 tablets as needed up to three times a day for restlessness.   Stay on same dose of vraylar and given more time to work and SE to lessen Propranolol as needed for restlessness Eucrisa for rash Follow up in 1-2 months and let me know if side effects improved and how mood is  doing   Follow Up Instructions:    I discussed the assessment and treatment plan with the patient. The patient was provided an opportunity to ask questions and all were answered. The patient agreed with the plan and demonstrated an understanding of the instructions.   The patient was advised to call back or seek an in-person evaluation if the symptoms worsen  or if the condition fails to improve as anticipated.    Iran Planas, PA-C

## 2022-12-12 ENCOUNTER — Other Ambulatory Visit (HOSPITAL_COMMUNITY): Payer: Self-pay

## 2022-12-12 MED ORDER — CLONAZEPAM 1 MG PO TABS
1.0000 mg | ORAL_TABLET | Freq: Every day | ORAL | 1 refills | Status: DC
  Filled 2022-12-12: qty 30, 30d supply, fill #0

## 2022-12-12 NOTE — Telephone Encounter (Signed)
Last OV: 12/09/22 Next OV: none Last RF: 04/07/22

## 2022-12-15 ENCOUNTER — Other Ambulatory Visit: Payer: Self-pay

## 2022-12-18 ENCOUNTER — Encounter: Payer: Self-pay | Admitting: Physician Assistant

## 2022-12-18 ENCOUNTER — Other Ambulatory Visit (HOSPITAL_COMMUNITY): Payer: Self-pay

## 2022-12-18 ENCOUNTER — Other Ambulatory Visit: Payer: Self-pay

## 2022-12-19 ENCOUNTER — Other Ambulatory Visit: Payer: Self-pay

## 2022-12-19 ENCOUNTER — Other Ambulatory Visit (HOSPITAL_COMMUNITY): Payer: Self-pay

## 2022-12-19 MED ORDER — BUPROPION HCL ER (XL) 150 MG PO TB24: 150.0000 mg | ORAL_TABLET | ORAL | 0 refills | Status: DC

## 2022-12-19 MED ORDER — BUPROPION HCL ER (XL) 150 MG PO TB24
150.0000 mg | ORAL_TABLET | ORAL | 0 refills | Status: DC
  Filled 2022-12-19: qty 90, 90d supply, fill #0

## 2022-12-19 NOTE — Addendum Note (Signed)
Addended by: Narda Rutherford on: 12/19/2022 03:08 PM   Modules accepted: Orders

## 2022-12-19 NOTE — Addendum Note (Signed)
Addended by: Narda Rutherford on: 12/19/2022 03:07 PM   Modules accepted: Orders

## 2022-12-20 ENCOUNTER — Other Ambulatory Visit: Payer: Self-pay | Admitting: Physician Assistant

## 2022-12-20 DIAGNOSIS — T887XXA Unspecified adverse effect of drug or medicament, initial encounter: Secondary | ICD-10-CM

## 2022-12-20 DIAGNOSIS — R451 Restlessness and agitation: Secondary | ICD-10-CM

## 2022-12-23 ENCOUNTER — Other Ambulatory Visit (HOSPITAL_COMMUNITY): Payer: Self-pay

## 2023-01-01 ENCOUNTER — Other Ambulatory Visit: Payer: Self-pay

## 2023-01-01 ENCOUNTER — Other Ambulatory Visit: Payer: Self-pay | Admitting: Physician Assistant

## 2023-01-01 DIAGNOSIS — T887XXA Unspecified adverse effect of drug or medicament, initial encounter: Secondary | ICD-10-CM

## 2023-01-01 DIAGNOSIS — R451 Restlessness and agitation: Secondary | ICD-10-CM

## 2023-01-03 ENCOUNTER — Other Ambulatory Visit (HOSPITAL_COMMUNITY): Payer: Self-pay

## 2023-01-07 ENCOUNTER — Other Ambulatory Visit: Payer: Self-pay | Admitting: Physician Assistant

## 2023-01-07 DIAGNOSIS — F901 Attention-deficit hyperactivity disorder, predominantly hyperactive type: Secondary | ICD-10-CM

## 2023-01-07 DIAGNOSIS — F331 Major depressive disorder, recurrent, moderate: Secondary | ICD-10-CM

## 2023-01-07 DIAGNOSIS — F3181 Bipolar II disorder: Secondary | ICD-10-CM

## 2023-01-08 ENCOUNTER — Other Ambulatory Visit: Payer: Self-pay

## 2023-01-08 MED ORDER — CARIPRAZINE HCL 1.5 MG PO CAPS
1.5000 mg | ORAL_CAPSULE | Freq: Every day | ORAL | 0 refills | Status: DC
  Filled 2023-01-08: qty 30, 30d supply, fill #0

## 2023-01-16 ENCOUNTER — Other Ambulatory Visit: Payer: Self-pay

## 2023-01-16 ENCOUNTER — Encounter: Payer: Self-pay | Admitting: Physician Assistant

## 2023-01-16 ENCOUNTER — Other Ambulatory Visit (HOSPITAL_COMMUNITY): Payer: Self-pay

## 2023-01-16 ENCOUNTER — Telehealth: Payer: Medicaid Other | Admitting: Physician Assistant

## 2023-01-16 DIAGNOSIS — F331 Major depressive disorder, recurrent, moderate: Secondary | ICD-10-CM

## 2023-01-16 DIAGNOSIS — T887XXA Unspecified adverse effect of drug or medicament, initial encounter: Secondary | ICD-10-CM

## 2023-01-16 DIAGNOSIS — F901 Attention-deficit hyperactivity disorder, predominantly hyperactive type: Secondary | ICD-10-CM | POA: Diagnosis not present

## 2023-01-16 DIAGNOSIS — R451 Restlessness and agitation: Secondary | ICD-10-CM | POA: Diagnosis not present

## 2023-01-16 DIAGNOSIS — F3181 Bipolar II disorder: Secondary | ICD-10-CM

## 2023-01-16 MED ORDER — AMPHETAMINE-DEXTROAMPHETAMINE 30 MG PO TABS
30.0000 mg | ORAL_TABLET | Freq: Two times a day (BID) | ORAL | 0 refills | Status: DC
  Filled 2023-01-16 – 2023-02-09 (×2): qty 60, 30d supply, fill #0

## 2023-01-16 MED ORDER — AMPHETAMINE-DEXTROAMPHETAMINE 30 MG PO TABS
30.0000 mg | ORAL_TABLET | Freq: Two times a day (BID) | ORAL | 0 refills | Status: DC
  Filled 2023-03-17: qty 60, 30d supply, fill #0

## 2023-01-16 MED ORDER — PROPRANOLOL HCL 10 MG PO TABS
10.0000 mg | ORAL_TABLET | Freq: Three times a day (TID) | ORAL | 1 refills | Status: DC
  Filled 2023-01-16: qty 270, 30d supply, fill #0
  Filled 2023-05-10: qty 270, 30d supply, fill #1

## 2023-01-16 MED ORDER — BUPROPION HCL ER (XL) 300 MG PO TB24
300.0000 mg | ORAL_TABLET | Freq: Every day | ORAL | 1 refills | Status: DC
  Filled 2023-01-16 (×2): qty 90, 90d supply, fill #0
  Filled 2023-05-01: qty 90, 90d supply, fill #1

## 2023-01-16 MED ORDER — AMPHETAMINE-DEXTROAMPHETAMINE 30 MG PO TABS
30.0000 mg | ORAL_TABLET | Freq: Two times a day (BID) | ORAL | 0 refills | Status: DC
  Filled 2023-05-01: qty 60, 30d supply, fill #0

## 2023-01-16 MED ORDER — CARIPRAZINE HCL 1.5 MG PO CAPS
1.5000 mg | ORAL_CAPSULE | Freq: Every day | ORAL | 1 refills | Status: DC
  Filled 2023-01-16 – 2023-02-09 (×2): qty 90, 90d supply, fill #0

## 2023-01-16 NOTE — Progress Notes (Signed)
..Virtual Visit via Video Note  I connected with Victoria Barnes on 01/16/23 at 10:50 AM EDT by a video enabled telemedicine application and verified that I am speaking with the correct person using two identifiers.  Location: Patient: car Provider: clinic  .Marland KitchenParticipating in visit:  Patient: Victoria Barnes Provider: Iran Planas PA-C   I discussed the limitations of evaluation and management by telemedicine and the availability of in person appointments. The patient expressed understanding and agreed to proceed.  History of Present Illness: Pt is a 37 yo female with ADHD, MDD, Bipolar 2 who calls in for refills.   Wellbutrin added at last visit and feels like it has helped with mood. She denies any major anxiety or depression. She is back to work. She continues to feel some SE of restlessness with vraylar but propranolol helps. No SI/HC. Her focus is good. She is sleeping well.   .. 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   Paresthesias 09/10/2016   Fatigue 02/23/2017   Bipolar 2 disorder ([place]) 07/07/2017   Dehydration 12/16/2017   Seasonal allergies 03/14/2018   Anxiety 09/01/2018   Acute stress reaction 02/07/2019   Tachycardia 05/02/2019   Alopecia 05/02/2019   Vaginal irritation 04/07/2022   Moderate episode of recurrent major depressive disorder ([place]) 11/12/2022   Restlessness 12/09/2022   Skin rash 12/09/2022   Resolved Ambulatory Problems    Diagnosis Date Noted   Vitamin D deficiency 06/20/2015   Depression 07/17/2015   Abnormal weight gain 07/19/2015   Pain in joint, ankle and foot 08/02/2015   Exposure to strep throat 09/07/2015   Viral gastroenteritis 09/13/2015   Abscess of skin  09/13/2015   Pharyngitis 11/06/2015   Migraine without aura and with status migrainosus, not intractable 05/22/2016   Insomnia 05/22/2016   Nausea without vomiting 05/24/2016   Severe recurrent major depression without psychotic features ([place]) 06/24/2016   Dehydration 08/06/2016   Episodic mood disorder ([place]) 11/11/2016   Influenza-like illness 12/11/2016   Side effects of treatment 02/23/2017   Past Medical History:  Diagnosis Date   ADHD    Bipolar I disorder ([place])        Observations/Objective: No acute distress Normal mood and appearance Normal breathing   .[name]    01/16/2023   11:08 AM 01/16/2023   11:07 AM 11/12/2022    7:49 AM 07/11/2022   10:26 AM 03/10/2022    3:02 PM  Depression screen PHQ 2/9  Decreased Interest 1 1 3  0 0  Down, Depressed, Hopeless 0 0 3 0 0  PHQ - 2 Score 1 1 6  0 0  Altered sleeping 1  3    Tired, decreased energy 1  3    Change in appetite 0  0    Feeling bad or failure about yourself  0  3    Trouble concentrating 1  3    Moving slowly or fidgety/restless 0  0    Suicidal thoughts 0  1    PHQ-9 Score 4  19    Difficult doing work/chores Somewhat difficult  Extremely dIfficult     .[name]    01/16/2023   11:08 AM 11/12/2022    7:50 AM 03/27/2020    1:11 PM 12/13/2019    1:54 PM  GAD 7 : Generalized Anxiety Score  Nervous, Anxious, on Edge 1 1 0 2  Control/stop worrying 1 3 0 3  Worry too much - different things 1 3 0 3  Trouble relaxing 1 0 0 1  Restless 1 0 0 0  Easily annoyed or irritable 1 3 0 0  Afraid - awful might happen 1 3 0 0  Total GAD 7 Score 7 13 0 9  Anxiety Difficulty Somewhat difficult Extremely difficult Not difficult at all        Assessment and Plan: Marland KitchenMarland KitchenJaquelyne was seen today for follow-up.  Diagnoses and all orders for this visit:  Attention deficit hyperactivity disorder (ADHD), predominantly hyperactive type -     amphetamine-dextroamphetamine (ADDERALL) 30 MG tablet; Take 1 tablet by mouth 2 (two) times daily. -      amphetamine-dextroamphetamine (ADDERALL) 30 MG tablet; Take 1 tablet by mouth 2 (two) times daily. -     amphetamine-dextroamphetamine (ADDERALL) 30 MG tablet; Take 1 tablet by mouth 2 (two) times daily. -     cariprazine (VRAYLAR) 1.5 MG capsule; Take 1 capsule (1.5 mg total) by mouth daily.  Bipolar 2 disorder (HCC) -     cariprazine (VRAYLAR) 1.5 MG capsule; Take 1 capsule (1.5 mg total) by mouth daily.  Moderate episode of recurrent major depressive disorder (HCC) -     buPROPion (WELLBUTRIN XL) 300 MG 24 hr tablet; Take 1 tablet (300 mg total) by mouth daily. -     cariprazine (VRAYLAR) 1.5 MG capsule; Take 1 capsule (1.5 mg total) by mouth daily.  Restlessness -     propranolol (INDERAL) 10 MG tablet; Take 1-3 tablets as needed up to three times a day for restlessness.  Medication side effects -     propranolol (INDERAL) 10 MG tablet; Take 1-3 tablets as needed up to three times a day for restlessness.   Refilled adderall Increased wellbutrin Continue on same dose of vraylar Use propanolol as needed for restlessness Follow up in 3 months   Follow Up Instructions:    I discussed the assessment and treatment plan with the patient. The patient was provided an opportunity to ask questions and all were answered. The patient agreed with the plan and demonstrated an understanding of the instructions.   The patient was advised to call back or seek an in-person evaluation if the symptoms worsen or if the condition fails to improve as anticipated.    Iran Planas, PA-C

## 2023-01-20 DIAGNOSIS — F3132 Bipolar disorder, current episode depressed, moderate: Secondary | ICD-10-CM | POA: Diagnosis not present

## 2023-01-27 DIAGNOSIS — F3132 Bipolar disorder, current episode depressed, moderate: Secondary | ICD-10-CM | POA: Diagnosis not present

## 2023-02-09 ENCOUNTER — Other Ambulatory Visit (HOSPITAL_COMMUNITY): Payer: Self-pay

## 2023-02-10 DIAGNOSIS — F3132 Bipolar disorder, current episode depressed, moderate: Secondary | ICD-10-CM | POA: Diagnosis not present

## 2023-02-14 ENCOUNTER — Telehealth: Payer: Medicaid Other | Admitting: Nurse Practitioner

## 2023-02-14 DIAGNOSIS — J029 Acute pharyngitis, unspecified: Secondary | ICD-10-CM

## 2023-02-14 NOTE — Progress Notes (Signed)
  E-Visit for Sore Throat  We are sorry that you are not feeling well.  Here is how we plan to help!  Your symptoms indicate a likely viral infection (Pharyngitis).   Pharyngitis is inflammation in the back of the throat which can cause a sore throat, scratchiness and sometimes difficulty swallowing.   Pharyngitis is typically caused by a respiratory virus and will just run its course.  Please keep in mind that your symptoms could last up to 10 days.  For throat pain, we recommend over the counter oral pain relief medications such as acetaminophen or aspirin, or anti-inflammatory medications such as ibuprofen or naproxen sodium.  Topical treatments such as oral throat lozenges or sprays may be used as needed.  Avoid close contact with loved ones, especially the very young and elderly.  Remember to wash your hands thoroughly throughout the day as this is the number one way to prevent the spread of infection and wipe down door knobs and counters with disinfectant.  After careful review of your answers, I would not recommend an antibiotic for your condition.  Antibiotics should not be used to treat conditions that we suspect are caused by viruses like the virus that causes the common cold or flu. However, some people can have Strep with atypical symptoms. You may need formal testing in clinic or office to confirm if your symptoms continue or worsen.  Providers prescribe antibiotics to treat infections caused by bacteria. Antibiotics are very powerful in treating bacterial infections when they are used properly.  To maintain their effectiveness, they should be used only when necessary.  Overuse of antibiotics has resulted in the development of super bugs that are resistant to treatment!    Home Care: Only take medications as instructed by your medical team. Do not drink alcohol while taking these medications. A steam or ultrasonic humidifier can help congestion.  You can place a towel over your head and  breathe in the steam from hot water coming from a faucet. Avoid close contacts especially the very young and the elderly. Cover your mouth when you cough or sneeze. Always remember to wash your hands.  Get Help Right Away If: You develop worsening fever or throat pain. You develop a severe head ache or visual changes. Your symptoms persist after you have completed your treatment plan.  Make sure you Understand these instructions. Will watch your condition. Will get help right away if you are not doing well or get worse.   Thank you for choosing an e-visit.  Your e-visit answers were reviewed by a board certified advanced clinical practitioner to complete your personal care plan. Depending upon the condition, your plan could have included both over the counter or prescription medications.  Please review your pharmacy choice. Make sure the pharmacy is open so you can pick up prescription now. If there is a problem, you may contact your provider through MyChart messaging and have the prescription routed to another pharmacy.  Your safety is important to us. If you have drug allergies check your prescription carefully.   For the next 24 hours you can use MyChart to ask questions about today's visit, request a non-urgent call back, or ask for a work or school excuse. You will get an email in the next two days asking about your experience. I hope that your e-visit has been valuable and will speed your recovery.  Victoria Dijuan Sleeth, FNP   5-10 minutes spent reviewing and documenting in chart.  

## 2023-02-17 DIAGNOSIS — F3132 Bipolar disorder, current episode depressed, moderate: Secondary | ICD-10-CM | POA: Diagnosis not present

## 2023-03-03 DIAGNOSIS — F3132 Bipolar disorder, current episode depressed, moderate: Secondary | ICD-10-CM | POA: Diagnosis not present

## 2023-03-11 ENCOUNTER — Other Ambulatory Visit (HOSPITAL_COMMUNITY): Payer: Self-pay

## 2023-03-11 ENCOUNTER — Ambulatory Visit: Payer: Medicaid Other | Admitting: Physician Assistant

## 2023-03-11 ENCOUNTER — Encounter: Payer: Self-pay | Admitting: Physician Assistant

## 2023-03-11 VITALS — BP 119/76 | HR 99 | Ht 59.0 in | Wt 121.0 lb

## 2023-03-11 DIAGNOSIS — L819 Disorder of pigmentation, unspecified: Secondary | ICD-10-CM

## 2023-03-11 DIAGNOSIS — F3181 Bipolar II disorder: Secondary | ICD-10-CM

## 2023-03-11 DIAGNOSIS — L700 Acne vulgaris: Secondary | ICD-10-CM

## 2023-03-11 DIAGNOSIS — L905 Scar conditions and fibrosis of skin: Secondary | ICD-10-CM | POA: Insufficient documentation

## 2023-03-11 MED ORDER — HYDROQUINONE 8 % EX EMUL
1.0000 | Freq: Two times a day (BID) | CUTANEOUS | 1 refills | Status: DC
  Filled 2023-03-11: qty 30, fill #0

## 2023-03-11 MED ORDER — CARIPRAZINE HCL 3 MG PO CAPS
3.0000 mg | ORAL_CAPSULE | Freq: Every day | ORAL | 1 refills | Status: DC
  Filled 2023-03-11 – 2023-04-10 (×2): qty 30, 30d supply, fill #0
  Filled 2023-05-10: qty 30, 30d supply, fill #1

## 2023-03-11 MED ORDER — HYDROQUINONE-HC-TRETINOIN 8-1-0.05 % EX EMUL
1.0000 "application " | Freq: Every day | CUTANEOUS | 2 refills | Status: DC
  Filled 2023-03-11: qty 30, fill #0

## 2023-03-11 MED ORDER — TRETINOIN 0.1 % EX CREA
TOPICAL_CREAM | Freq: Every day | CUTANEOUS | 1 refills | Status: DC
  Filled 2023-03-11: qty 45, 30d supply, fill #0
  Filled 2023-05-01: qty 45, 30d supply, fill #1

## 2023-03-11 NOTE — Progress Notes (Signed)
Acute Office Visit  Subjective:     Patient ID: Victoria Barnes, female    DOB: 1986/07/06, 37 y.o.   MRN: [id number]  Chief Complaint  Patient presents with   Rash    HPI Patient is in today for hyperpigmentation over pustules that open up and heal and then scar. Mostly over trunk, back, arms. She noticed this over the past few months and worried it could be vraylar.   Continues to feel like she is not where she wants to be with mood. Depression is 50 percent better but she still struggles with lack of motivation and depressed mood. No SI/HC. She would like to increase her medication.    . 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   Paresthesias 09/10/2016   Fatigue 02/23/2017   Bipolar 2 disorder (HCC) 07/07/2017   Dehydration 12/16/2017   Seasonal allergies 03/14/2018   Anxiety 09/01/2018   Acute stress reaction 02/07/2019   Tachycardia 05/02/2019   Alopecia 05/02/2019   Vaginal irritation 04/07/2022   Moderate episode of recurrent major depressive disorder (HCC) 11/12/2022   Restlessness 12/09/2022   Skin rash 12/09/2022   Scarring 03/11/2023   Hyperpigmentation 03/11/2023   Acne vulgaris 03/11/2023   Resolved Ambulatory Problems    Diagnosis Date Noted   Vitamin D deficiency 06/20/2015   Depression 07/17/2015   Abnormal weight gain 07/19/2015   Pain in joint, ankle and foot 08/02/2015   Exposure to strep throat 09/07/2015   Viral gastroenteritis 09/13/2015   Abscess of skin 09/13/2015   Pharyngitis 11/06/2015   Migraine without aura and with status migrainosus, not intractable 05/22/2016   Insomnia 05/22/2016   Nausea without vomiting 05/24/2016   Severe recurrent major depression  without psychotic features (HCC) 06/24/2016   Dehydration 08/06/2016   Episodic mood disorder (HCC) 11/11/2016   Influenza-like illness 12/11/2016   Side effects of treatment 02/23/2017   Past Medical History:  Diagnosis Date   ADHD    Bipolar I disorder (HCC)      ROS  See HPI.     Objective:    BP 119/76   Pulse 99   Ht 4\' 11"  (1.499 m)   Wt 121 lb (54.9 kg)   SpO2 99%   BMI 24.44 kg/m  BP Readings from Last 3 Encounters:  03/11/23 119/76  08/27/22 113/63  07/11/22 123/73   Wt Readings from Last 3 Encounters:  03/11/23 121 lb (54.9 kg)  11/12/22 128 lb (58.1 kg)  08/27/22 130 lb (59 kg)    .[name]    01/16/2023   11:08 AM 01/16/2023   11:07 AM 11/12/2022    7:49 AM 07/11/2022   10:26 AM 03/10/2022    3:02 PM  Depression screen PHQ 2/9  Decreased Interest 1 1 3  0 0  Down, Depressed, Hopeless 0 0 3 0 0  PHQ - 2 Score 1 1 6  0 0  Altered sleeping 1  3    Tired, decreased energy 1  3    Change in appetite 0  0    Feeling bad or failure about yourself  0  3    Trouble concentrating 1  3    Moving slowly or fidgety/restless 0  0    Suicidal thoughts 0  1  PHQ-9 Score 4  19    Difficult doing work/chores Somewhat difficult  Extremely dIfficult     .[name]    01/16/2023   11:08 AM 11/12/2022    7:50 AM 03/27/2020    1:11 PM 12/13/2019    1:54 PM  GAD 7 : Generalized Anxiety Score  Nervous, Anxious, on Edge 1 1 0 2  Control/stop worrying 1 3 0 3  Worry too much - different things 1 3 0 3  Trouble relaxing 1 0 0 1  Restless 1 0 0 0  Easily annoyed or irritable 1 3 0 0  Afraid - awful might happen 1 3 0 0  Total GAD 7 Score 7 13 0 9  Anxiety Difficulty Somewhat difficult Extremely difficult Not difficult at all       Physical Exam Constitutional:      Appearance: Normal appearance.  HENT:     Head: Normocephalic.  Cardiovascular:     Rate and Rhythm: Normal rate and regular rhythm.  Pulmonary:     Effort: Pulmonary effort is normal.     Breath sounds: Normal  breath sounds.  Skin:    Comments: Scattered pustule over back and arms with scars and hyperpigmentation left where pustule has been.   Neurological:     General: No focal deficit present.     Mental Status: She is alert and oriented to person, place, and time.  Psychiatric:        Mood and Affect: Mood normal.          Assessment & Plan:  Marland KitchenMarland KitchenKrisha was seen today for rash.  Diagnoses and all orders for this visit:  Hyperpigmentation -     Hydroquinone-HC-Tretinoin 8-1-0.05 % EMUL; Apply 1 application  topically daily. -     Hydroquinone 8 % EMUL; Apply 1 Application topically 2 (two) times daily. For hyperpigmentation. -     tretinoin (RETIN-A) 0.1 % cream; Apply topically at bedtime. -     Ambulatory referral to Dermatology  Acne vulgaris -     Hydroquinone-HC-Tretinoin 8-1-0.05 % EMUL; Apply 1 application  topically daily. -     tretinoin (RETIN-A) 0.1 % cream; Apply topically at bedtime. -     Ambulatory referral to Dermatology  Bipolar 2 disorder (HCC) -     cariprazine (VRAYLAR) 3 MG capsule; Take 1 capsule (3 mg total) by mouth daily.   2 creams sent for acne and hyperpigmentation Referral for dermatology made today  PHQ/GAD improved so much! Increased vraylar to 3mg , trial of 1/2 tablet increase every other day until she makes sure she can tolerate. Continue on wellbutrin     Tandy Gaw, PA-C

## 2023-03-12 ENCOUNTER — Other Ambulatory Visit (HOSPITAL_COMMUNITY): Payer: Self-pay

## 2023-03-13 ENCOUNTER — Other Ambulatory Visit (HOSPITAL_COMMUNITY): Payer: Self-pay

## 2023-03-13 ENCOUNTER — Encounter: Payer: Self-pay | Admitting: Physician Assistant

## 2023-03-13 ENCOUNTER — Other Ambulatory Visit: Payer: Self-pay

## 2023-03-17 DIAGNOSIS — F3132 Bipolar disorder, current episode depressed, moderate: Secondary | ICD-10-CM | POA: Diagnosis not present

## 2023-03-18 ENCOUNTER — Other Ambulatory Visit: Payer: Self-pay

## 2023-04-06 ENCOUNTER — Telehealth: Payer: Medicaid Other | Admitting: Physician Assistant

## 2023-04-06 DIAGNOSIS — R3989 Other symptoms and signs involving the genitourinary system: Secondary | ICD-10-CM

## 2023-04-07 MED ORDER — NITROFURANTOIN MONOHYD MACRO 100 MG PO CAPS: 100.0000 mg | ORAL_CAPSULE | Freq: Two times a day (BID) | ORAL | 0 refills | Status: DC

## 2023-04-07 NOTE — Progress Notes (Signed)

## 2023-04-07 NOTE — Progress Notes (Signed)
I have spent 5 minutes in review of e-visit questionnaire, review and updating patient chart, medical decision making and response to patient.   Kalin Amrhein Cody Anastazja Isaac, PA-C    

## 2023-04-10 ENCOUNTER — Other Ambulatory Visit (HOSPITAL_COMMUNITY): Payer: Self-pay

## 2023-05-01 ENCOUNTER — Other Ambulatory Visit (HOSPITAL_COMMUNITY): Payer: Self-pay

## 2023-05-01 ENCOUNTER — Other Ambulatory Visit: Payer: Self-pay

## 2023-05-01 ENCOUNTER — Other Ambulatory Visit (HOSPITAL_BASED_OUTPATIENT_CLINIC_OR_DEPARTMENT_OTHER): Payer: Self-pay

## 2023-05-10 ENCOUNTER — Telehealth: Payer: Medicaid Other | Admitting: Family

## 2023-05-10 DIAGNOSIS — B3731 Acute candidiasis of vulva and vagina: Secondary | ICD-10-CM | POA: Diagnosis not present

## 2023-05-10 MED ORDER — FLUCONAZOLE 150 MG PO TABS: 150.0000 mg | ORAL_TABLET | ORAL | 0 refills | Status: DC | PRN

## 2023-05-10 NOTE — Progress Notes (Signed)

## 2023-05-11 ENCOUNTER — Other Ambulatory Visit: Payer: Self-pay

## 2023-05-17 ENCOUNTER — Encounter: Payer: Self-pay | Admitting: Emergency Medicine

## 2023-05-17 ENCOUNTER — Ambulatory Visit
Admission: EM | Admit: 2023-05-17 | Discharge: 2023-05-17 | Disposition: A | Discharge status: Home / Self Care | Payer: Medicaid Other | Attending: Emergency Medicine | Admitting: Emergency Medicine

## 2023-05-17 DIAGNOSIS — N949 Unspecified condition associated with female genital organs and menstrual cycle: Secondary | ICD-10-CM | POA: Diagnosis not present

## 2023-05-17 LAB — POCT URINALYSIS DIP (MANUAL ENTRY)
Bilirubin, UA: NEGATIVE
Glucose, UA: NEGATIVE mg/dL
Ketones, POC UA: NEGATIVE mg/dL
Leukocytes, UA: NEGATIVE
Nitrite, UA: NEGATIVE
Protein Ur, POC: NEGATIVE mg/dL
Spec Grav, UA: >=1.03 — AB (ref 1.010–1.025)
Urobilinogen, UA: 0.2 E.U./dL
pH, UA: 5.5 (ref 5.0–8.0)

## 2023-05-17 MED ORDER — FLUCONAZOLE 150 MG PO TABS: 150.0000 mg | ORAL_TABLET | Freq: Every day | ORAL | 0 refills | Status: AC

## 2023-05-17 NOTE — Discharge Instructions (Addendum)
Today you are being treated prophylactically for yeast.   Take diflucan 150 mg once, if symptoms still present in 3 days then you may take second pill   Yeast infections which are caused by a naturally occurring fungus called candida. Vaginosis is an inflammation of the vagina that can result in discharge, itching and pain. The cause is usually a change in the normal balance of vaginal bacteria or an infection. Vaginosis can also result from reduced estrogen levels after menopause.  Labs pending , you will be contacted if positive for any sti and treatment will be sent to the pharmacy, you will have to return to the clinic if positive for gonorrhea to receive treatment   Please refrain from having sex until labs results, if positive please refrain from having sex until treatment complete and symptoms resolve   If positive for , Chlamydia  gonorrhea or trichomoniasis please notify partner or partners so they may tested as well  Moving forward, it is recommended you use some form of protection against the transmission of sti infections  such as condoms or dental dams with each sexual encounter    In addition:   Avoid baths, hot tubs and whirlpool spas.  Don't use scented or harsh soaps, such as those with deodorant or antibacterial action. Avoid irritants. These include scented tampons and pads. Wipe from front to back after using the toilet.  Don't douche. Your vagina doesn't require cleansing other than normal bathing.  Use a  condom. Wear cotton underwear, this fabric helps absorb moisture

## 2023-05-17 NOTE — ED Triage Notes (Signed)
Pt said x 1 week has had some vaginal itching and called and did an e-visit and was given diflucan and pt said she is still having some tingling and some slight burn or irritation on her labia area. Pt said no discomfort with sex, no discharge, no smell.

## 2023-05-17 NOTE — ED Provider Notes (Signed)
[facility]    CSN: [id number] Arrival date & time: 05/17/23  1446      History   Chief Complaint Chief Complaint  Patient presents with   Vaginal Itching    HPI WAYLON SAMUELSON is a 37 y.o. female.   Patient presents for evaluation of vaginal tingling in the mouth.  This that has been present for greater than 7 days.  Completed an e-visit and was treated for yeast, took 2 tablets of Diflucan, symptoms improved but did not fully resolve.  Symptoms now feel more external than internal.  Denies abdominal pain, flank pain, fever, vaginal discharge or odor, urinary symptoms.  No known exposure but does have new partner.   Past Medical History:  Diagnosis Date   ADHD    Bipolar I disorder (HCC)    Depression    Eczema 2012   Migraine without status migrainosus, not intractable 02/26/2016    Patient Active Problem List   Diagnosis Date Noted   Scarring 03/11/2023   Hyperpigmentation 03/11/2023   Acne vulgaris 03/11/2023   Restlessness 12/09/2022   Skin rash 12/09/2022   Moderate episode of recurrent major depressive disorder (HCC) 11/12/2022   Vaginal irritation 04/07/2022   Tachycardia 05/02/2019   Alopecia 05/02/2019   Acute stress reaction 02/07/2019   Anxiety 09/01/2018   Seasonal allergies 03/14/2018   Dehydration 12/16/2017   Bipolar 2 disorder (HCC) 07/07/2017   Fatigue 02/23/2017   Paresthesias 09/10/2016   Situational mixed anxiety and depressive disorder 06/17/2016   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 Authorizing Provider  amphetamine-dextroamphetamine (ADDERALL) 30 MG tablet Take 1 tablet by mouth 2 (two) times daily. 02/15/23   Breeback, Jade L, PA-C  amphetamine-dextroamphetamine (ADDERALL) 30 MG tablet Take 1 tablet by mouth 2 (two) times daily. 01/16/23   Breeback, Jade L, PA-C  amphetamine-dextroamphetamine (ADDERALL) 30 MG tablet Take 1 tablet by mouth 2 (two) times daily. 03/16/23   Breeback, Jade L, PA-C  buPROPion (WELLBUTRIN XL) 300 MG 24 hr tablet Take 1 tablet (300 mg total) by mouth daily. 01/16/23   Breeback, Lonna Cobb, PA-C  cariprazine (VRAYLAR) 3 MG capsule Take 1 capsule (3 mg total) by mouth daily. 03/11/23   Breeback, Lonna Cobb, PA-C  cholecalciferol (VITAMIN D3) 25 MCG (1000 UNIT) tablet Take 1,000 Units by mouth daily.    [provider]  clonazePAM (KLONOPIN) 1 MG tablet Take 1 tablet (1 mg total) by mouth daily as needed for acute anxiety and panic. 12/12/22   Breeback, Jade L, PA-C  Crisaborole (EUCRISA) 2 % OINT Apply 1 Application topically as needed. 12/09/22   Jomarie Longs,  PA-C  fexofenadine (ALLEGRA ALLERGY) 180 MG tablet Take 1 tablet (180 mg total) by mouth daily. 03/10/22   Breeback, Jade L, PA-C  fluconazole (DIFLUCAN) 150 MG tablet Take 1 tablet (150 mg total) by mouth every three (3) days as needed. 05/10/23   Junie Spencer, FNP  fluticasone (FLONASE) 50 MCG/ACT nasal spray USE 1 SPRAY IN EACH NOSTRIL ONCE A DAY 08/27/22 08/27/23  Breeback, Jade L, PA-C  Ginkgo Biloba 40 MG TABS Take by mouth.    [provider]  Hydroquinone 8 % EMUL Apply 1 Application topically 2 (two) times daily. For hyperpigmentation. 03/11/23   Breeback, Jade L, PA-C  Hydroquinone-HC-Tretinoin 8-1-0.05 % EMUL Apply 1 application  topically daily. 03/11/23   Breeback, Lonna Cobb, PA-C  nitrofurantoin, macrocrystal-monohydrate, (MACROBID) 100 MG  capsule Take 1 capsule (100 mg total) by mouth 2 (two) times daily. 04/07/23   Waldon Merl, PA-C  propranolol (INDERAL) 10 MG tablet Take 1-3 tablets (10-30 mg total) by mouth up to  3 (three) times daily for restlessness. 01/16/23   Breeback, Jade L, PA-C  rizatriptan (MAXALT-MLT) 10 MG disintegrating tablet TAKE 1 TABLET BY MOUTH AS NEEDED FOR MIGRAINE. MAY REPEAT IN 2 HOURS IF NEEDED 06/05/21 11/12/22  Breeback, Jade L, PA-C  tretinoin (RETIN-A) 0.1 % cream Apply topically at bedtime. 03/11/23   Breeback, Jade L, PA-C  valACYclovir (VALTREX) 1000 MG tablet TAKE 2 TABLETS TWICE A DAY FOR ONE DAY FOR OUTBREAK. 08/27/22   Jomarie Longs, PA-C    Family History Family History  Problem Relation Age of Onset   Hyperlipidemia Mother    Personality disorder Maternal Grandmother    Depression Maternal Grandmother    Mood Disorder Maternal Grandmother     Social History Social History   Tobacco Use   Smoking status: Former    Current packs/day: 0.00    Types: Cigarettes    Quit date: 11/2014    Years since quitting: 8.5   Smokeless tobacco: Never   Tobacco comments:    quit 2 years  Vaping Use   Vaping status: Never Used  Substance Use Topics   Alcohol use: Yes    Comment: once or twice a month - socially   Drug use: No     Allergies   Lamictal [lamotrigine], Antacid [alum & mag hydroxide-simeth], Keflex [cephalexin], Penicillins, Trintellix [vortioxetine], and Viibryd [vilazodone hcl]   Review of Systems Review of Systems   Physical Exam Triage Vital Signs ED Triage Vitals  Encounter Vitals Group     BP 05/17/23 1530 116/80     Systolic BP Percentile --      Diastolic BP Percentile --      Pulse Rate 05/17/23 1530 84     Resp 05/17/23 1530 16     Temp 05/17/23 1530 98.6 F (37 C)     Temp Source 05/17/23 1530 Oral     SpO2 05/17/23 1530 98 %     Weight --      Height --      Head Circumference --      Peak Flow --      Pain Score 05/17/23 1533 7     Pain Loc  --      Pain Education --      Exclude from Growth Chart --    No data found.  Updated Vital Signs BP 116/80 (BP Location: Left Arm)   Pulse 84   Temp 98.6 F (37 C) (Oral)   Resp 16  LMP 05/09/2023 (Exact Date)   SpO2 98%   Visual Acuity Right Eye Distance:   Left Eye Distance:   Bilateral Distance:    Right Eye Near:   Left Eye Near:    Bilateral Near:     Physical Exam Constitutional:      Appearance: Normal appearance.  Eyes:     Extraocular Movements: Extraocular movements intact.  Pulmonary:     Effort: Pulmonary effort is normal.  Genitourinary:    Comments: deferred Skin:    General: Skin is warm and dry.  Neurological:     Mental Status: She is alert and oriented to person, place, and time. Mental status is at baseline.      UC Treatments / Results  Labs (all labs ordered are listed, but only abnormal results are displayed) Labs Reviewed  CERVICOVAGINAL ANCILLARY ONLY    EKG   Radiology No results found.  Procedures Procedures (including critical care time)  Medications Ordered in UC Medications - No data to display  Initial Impression / Assessment and Plan / UC Course  I have reviewed the triage vital signs and the nursing notes.  Pertinent labs & imaging results that were available during my care of the patient were reviewed by me and considered in my medical decision making (see chart for details).  Vaginal discomfort  Will attempt additional treatment for yeast, Diflucan sent to pharmacy, discussed administration, STI labs are pending, will treat further per protocol, advised abstinence during treatment, until symptoms resolved until all labs were resulted, advised condom use during encounters moving forward, may follow-up with urgent care as needed Final Clinical Impressions(s) / UC Diagnoses   Final diagnoses:  None   Discharge Instructions   None    ED Prescriptions   None    PDMP not reviewed this encounter.   Valinda Hoar, NP 05/17/23 1557

## 2023-05-18 ENCOUNTER — Telehealth: Payer: Self-pay

## 2023-05-18 LAB — CERVICOVAGINAL ANCILLARY ONLY
Bacterial Vaginitis (gardnerella): POSITIVE — AB
Candida Glabrata: NEGATIVE
Candida Vaginitis: NEGATIVE
Chlamydia: NEGATIVE
Comment: NEGATIVE
Comment: NEGATIVE
Comment: NEGATIVE
Comment: NEGATIVE
Comment: NEGATIVE
Comment: NORMAL
Neisseria Gonorrhea: NEGATIVE
Trichomonas: NEGATIVE

## 2023-05-18 MED ORDER — METRONIDAZOLE 500 MG PO TABS: 500.0000 mg | ORAL_TABLET | Freq: Two times a day (BID) | ORAL | 0 refills | Status: AC

## 2023-05-18 NOTE — Telephone Encounter (Signed)
Per protocol, pt requires tx with metronidazole. Reviewed with patient, verified pharmacy, prescription sent.

## 2023-06-02 ENCOUNTER — Encounter: Payer: Medicaid Other | Admitting: Physician Assistant

## 2023-06-05 ENCOUNTER — Ambulatory Visit: Payer: Medicaid Other | Admitting: Physician Assistant

## 2023-06-05 ENCOUNTER — Other Ambulatory Visit (HOSPITAL_COMMUNITY): Payer: Self-pay

## 2023-06-05 ENCOUNTER — Other Ambulatory Visit: Payer: Self-pay

## 2023-06-05 VITALS — BP 121/72 | HR 100 | Ht 59.0 in | Wt 114.0 lb

## 2023-06-05 DIAGNOSIS — Z0000 Encounter for general adult medical examination without abnormal findings: Secondary | ICD-10-CM

## 2023-06-05 DIAGNOSIS — F3181 Bipolar II disorder: Secondary | ICD-10-CM

## 2023-06-05 DIAGNOSIS — R202 Paresthesia of skin: Secondary | ICD-10-CM

## 2023-06-05 DIAGNOSIS — R200 Anesthesia of skin: Secondary | ICD-10-CM

## 2023-06-05 DIAGNOSIS — F901 Attention-deficit hyperactivity disorder, predominantly hyperactive type: Secondary | ICD-10-CM | POA: Diagnosis not present

## 2023-06-05 DIAGNOSIS — Z131 Encounter for screening for diabetes mellitus: Secondary | ICD-10-CM | POA: Diagnosis not present

## 2023-06-05 DIAGNOSIS — Z13220 Encounter for screening for lipoid disorders: Secondary | ICD-10-CM

## 2023-06-05 DIAGNOSIS — F331 Major depressive disorder, recurrent, moderate: Secondary | ICD-10-CM

## 2023-06-05 MED ORDER — CARIPRAZINE HCL 3 MG PO CAPS
3.0000 mg | ORAL_CAPSULE | Freq: Every day | ORAL | 1 refills | Status: DC
Start: 2023-06-05 — End: 2023-09-09
  Filled 2023-06-05 – 2023-06-25 (×2): qty 30, 30d supply, fill #0
  Filled 2023-08-04: qty 30, 30d supply, fill #1

## 2023-06-05 MED ORDER — BUPROPION HCL ER (XL) 300 MG PO TB24
300.0000 mg | ORAL_TABLET | Freq: Every day | ORAL | 1 refills | Status: DC
Start: 2023-06-05 — End: 2024-01-28
  Filled 2023-06-05 – 2023-08-04 (×2): qty 90, 90d supply, fill #0
  Filled 2023-11-03: qty 90, 90d supply, fill #1

## 2023-06-05 MED ORDER — AMPHETAMINE-DEXTROAMPHETAMINE 30 MG PO TABS
30.0000 mg | ORAL_TABLET | Freq: Two times a day (BID) | ORAL | 0 refills | Status: DC
Start: 2023-06-05 — End: 2023-11-03
  Filled 2023-06-05 – 2023-06-25 (×2): qty 60, 30d supply, fill #0

## 2023-06-05 MED ORDER — AMPHETAMINE-DEXTROAMPHETAMINE 30 MG PO TABS
30.0000 mg | ORAL_TABLET | Freq: Two times a day (BID) | ORAL | 0 refills | Status: DC
  Filled 2023-09-09: qty 60, 30d supply, fill #0

## 2023-06-05 MED ORDER — AMPHETAMINE-DEXTROAMPHETAMINE 30 MG PO TABS: 30.0000 mg | ORAL_TABLET | Freq: Two times a day (BID) | ORAL | 0 refills | Status: DC

## 2023-06-05 NOTE — Patient Instructions (Signed)

## 2023-06-08 ENCOUNTER — Encounter: Payer: Self-pay | Admitting: Physician Assistant

## 2023-06-08 ENCOUNTER — Other Ambulatory Visit: Payer: Self-pay

## 2023-06-08 DIAGNOSIS — R200 Anesthesia of skin: Secondary | ICD-10-CM | POA: Insufficient documentation

## 2023-06-08 NOTE — Progress Notes (Signed)
Victoria Barnes,   Thyroid looks great.  No anemia.  Kidney and liver look good.  B12 looks great.  Vitamin D looks good. Continue at least 1000 units supplement daily with diary.  A1C normal range.  Good cholesterol looks great.  LDL not optimal but looks good.

## 2023-06-08 NOTE — Progress Notes (Signed)
Complete physical exam  Patient: Victoria Barnes   DOB: September 18, 1986   37 y.o. Female  MRN: [id number]  Subjective:    Chief Complaint  Patient presents with   Annual Exam    Victoria Barnes is a 37 y.o. female who presents today for a complete physical exam. She reports consuming a general diet. The patient does not participate in regular exercise at present. She generally feels well. She reports sleeping well. She does have additional problems to discuss today.   Pt has been having for the last 2 months numbness and tingling of both hands and fingers. Worse at night and first thing in the morning. Seems to improve after "shaking hands out".   Most recent fall risk assessment:    07/11/2022   10:26 AM  Fall Risk   Falls in the past year? 0  Number falls in past yr: 0  Injury with Fall? 0  Risk for fall due to : No Fall Risks  Follow up Falls evaluation completed     Most recent depression screenings:    06/05/2023    1:31 PM 01/16/2023   11:08 AM  PHQ 2/9 Scores  PHQ - 2 Score 1 1  PHQ- 9 Score  4    Vision:Within last year and Dental: No current dental problems and Receives regular dental care  Patient Active Problem List   Diagnosis Date Noted   Numbness and tingling in both hands 06/08/2023   Scarring 03/11/2023   Hyperpigmentation 03/11/2023   Acne vulgaris 03/11/2023   Restlessness 12/09/2022   Skin rash 12/09/2022   Moderate episode of recurrent major depressive disorder (HCC) 11/12/2022   Vaginal irritation 04/07/2022   Tachycardia 05/02/2019   Alopecia 05/02/2019   Acute stress reaction 02/07/2019   Anxiety 09/01/2018   Seasonal allergies 03/14/2018   Dehydration 12/16/2017   Bipolar 2 disorder (HCC) 07/07/2017   Fatigue 02/23/2017   Paresthesias 09/10/2016   Situational mixed anxiety and depressive disorder 06/17/2016   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   Migraine without status migrainosus, not intractable 02/26/2016   Attention deficit hyperactivity  disorder (ADHD) 01/08/2016   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   clonazePAM (KLONOPIN) 1 MG tablet Take 1 tablet (1 mg total) by mouth daily as needed for acute anxiety and panic.   fexofenadine (ALLEGRA ALLERGY) 180 MG tablet Take 1 tablet (180 mg total) by mouth daily.   fluticasone (FLONASE) 50 MCG/ACT nasal spray USE 1 SPRAY IN EACH NOSTRIL ONCE A DAY   Hydroquinone 8 % EMUL Apply 1 Application topically 2 (two) times daily. For hyperpigmentation.   Hydroquinone-HC-Tretinoin 8-1-0.05 % EMUL Apply 1 application  topically daily.   propranolol (INDERAL) 10 MG tablet Take 1-3 tablets (10-30 mg total) by mouth up to  3 (three) times daily for restlessness.   tretinoin (RETIN-A) 0.1 % cream Apply topically at bedtime.   valACYclovir (VALTREX)  1000 MG tablet TAKE 2 TABLETS TWICE A DAY FOR ONE DAY FOR OUTBREAK.   [DISCONTINUED] amphetamine-dextroamphetamine (ADDERALL) 30 MG tablet Take 1 tablet by mouth 2 (two) times daily.   [DISCONTINUED] amphetamine-dextroamphetamine (ADDERALL) 30 MG tablet Take 1 tablet by mouth 2 (two) times daily.   [DISCONTINUED] amphetamine-dextroamphetamine (ADDERALL) 30 MG tablet Take 1 tablet by mouth 2 (two) times daily.   [DISCONTINUED] buPROPion (WELLBUTRIN XL) 300 MG 24 hr tablet Take 1 tablet (300 mg total) by mouth daily.   [DISCONTINUED] cariprazine (VRAYLAR) 3 MG capsule Take 1 capsule (3 mg total) by mouth daily.    rizatriptan (MAXALT-MLT) 10 MG disintegrating tablet TAKE 1 TABLET BY MOUTH AS NEEDED FOR MIGRAINE. MAY REPEAT IN 2 HOURS IF NEEDED   [DISCONTINUED] Crisaborole (EUCRISA) 2 % OINT Apply 1 Application topically as needed.   [DISCONTINUED] Ginkgo Biloba 40 MG TABS Take by mouth.   [DISCONTINUED] nitrofurantoin, macrocrystal-monohydrate, (MACROBID) 100 MG capsule Take 1 capsule (100 mg total) by mouth 2 (two) times daily.   No facility-administered medications prior to visit.    ROS   See HPI.      Objective:     BP 121/72   Pulse 100   Ht 4\' 11"  (1.499 m)   Wt 114 lb (51.7 kg)   LMP 05/09/2023 (Exact Date)   SpO2 100%   BMI 23.03 kg/m  BP Readings from Last 3 Encounters:  06/05/23 121/72  05/17/23 116/80  03/11/23 119/76   Wt Readings from Last 3 Encounters:  06/05/23 114 lb (51.7 kg)  03/11/23 121 lb (54.9 kg)  11/12/22 128 lb (58.1 kg)      Physical Exam  BP 121/72   Pulse 100   Ht 4\' 11"  (1.499 m)   Wt 114 lb (51.7 kg)   LMP 05/09/2023 (Exact Date)   SpO2 100%   BMI 23.03 kg/m   General Appearance:    Alert, cooperative, no distress, appears stated age  Head:    Normocephalic, without obvious abnormality, atraumatic  Eyes:    PERRL, conjunctiva/corneas clear, EOM's intact, fundi    benign, both eyes  Ears:    Normal TM's and external ear canals, both ears  Nose:   Nares normal, septum midline, mucosa normal, no drainage    or sinus tenderness  Throat:   Lips, mucosa, and tongue normal; teeth and gums normal  Neck:   Supple, symmetrical, trachea midline, no adenopathy;    thyroid:  no enlargement/tenderness/nodules; no carotid   bruit or JVD  Back:     Symmetric, no curvature, ROM normal, no CVA tenderness  Lungs:     Clear to auscultation bilaterally, respirations unlabored  Chest Wall:    No tenderness or deformity   Heart:    Regular rate and rhythm, S1 and S2 normal, no murmur, rub   or gallop     Abdomen:     Soft, non-tender, bowel sounds active all  four quadrants,    no masses, no organomegaly        Extremities:   Extremities normal, atraumatic, no cyanosis or edema  Pulses:   2+ and symmetric all extremities  Skin:   Skin color, texture, turgor normal, no rashes or lesions  Lymph nodes:   Cervical, supraclavicular, and axillary nodes normal  Neurologic:   CNII-XII intact, normal strength, sensation and reflexes    Throughout, negative phalens and tinels bilaterally.     Results for orders placed or performed in visit on 06/05/23  TSH  Result Value Ref  Range   TSH 0.741 0.450 - 4.500 uIU/mL  CBC with Differential/Platelet  Result Value Ref Range   WBC 6.9 3.4 - 10.8 x10E3/uL   RBC 4.68 3.77 - 5.28 x10E6/uL   Hemoglobin 14.1 11.1 - 15.9 g/dL   Hematocrit 82.9 56.2 - 46.6 %   MCV 92 79 - 97 fL   MCH 30.1 26.6 - 33.0 pg   MCHC 32.7 31.5 - 35.7 g/dL   RDW 13.0 86.5 - 78.4 %   Platelets 215 150 - 450 x10E3/uL   Neutrophils 65 Not Estab. %   Lymphs 25 Not Estab. %   Monocytes 7 Not Estab. %   Eos 3 Not Estab. %   Basos 0 Not Estab. %   Neutrophils Absolute 4.5 1.4 - 7.0 x10E3/uL   Lymphocytes Absolute 1.7 0.7 - 3.1 x10E3/uL   Monocytes Absolute 0.5 0.1 - 0.9 x10E3/uL   EOS (ABSOLUTE) 0.2 0.0 - 0.4 x10E3/uL   Basophils Absolute 0.0 0.0 - 0.2 x10E3/uL   Immature Granulocytes 0 Not Estab. %   Immature Grans (Abs) 0.0 0.0 - 0.1 x10E3/uL  CMP14+EGFR  Result Value Ref Range   Glucose 70 70 - 99 mg/dL   BUN 9 6 - 20 mg/dL   Creatinine, Ser 6.96 0.57 - 1.00 mg/dL   eGFR 90 >29 BM/WUX/3.24   BUN/Creatinine Ratio 10 9 - 23   Sodium 137 134 - 144 mmol/L   Potassium 4.0 3.5 - 5.2 mmol/L   Chloride 103 96 - 106 mmol/L   CO2 21 20 - 29 mmol/L   Calcium 9.8 8.7 - 10.2 mg/dL   Total Protein 7.2 6.0 - 8.5 g/dL   Albumin 4.4 3.9 - 4.9 g/dL   Globulin, Total 2.8 1.5 - 4.5 g/dL   Bilirubin Total 0.5 0.0 - 1.2 mg/dL   Alkaline Phosphatase 65 44 - 121 IU/L   AST 21 0 - 40 IU/L   ALT 16 0 - 32 IU/L  Lipid panel  Result Value Ref  Range   Cholesterol, Total 205 (H) 100 - 199 mg/dL   Triglycerides 76 0 - 149 mg/dL   HDL 76 >40 mg/dL   VLDL Cholesterol Cal 14 5 - 40 mg/dL   LDL Chol Calc (NIH) 102 (H) 0 - 99 mg/dL   Chol/HDL Ratio 2.7 0.0 - 4.4 ratio  B12 and Folate Panel  Result Value Ref Range   Vitamin B-12 706 232 - 1,245 pg/mL   Folate 5.8 >3.0 ng/mL  VITAMIN D 25 Hydroxy (Vit-D Deficiency, Fractures)  Result Value Ref Range   Vit D, 25-Hydroxy 37.9 30.0 - 100.0 ng/mL  Hemoglobin A1c  Result Value Ref Range   Hgb A1c MFr Bld 5.5 4.8 - 5.6 %   Est. average glucose Bld gHb Est-mCnc 111 mg/dL       Assessment & Plan:    Routine Health Maintenance and Physical Exam  Immunization History  Administered Date(s) Administered   Influenza,inj,Quad PF,6+ Mos 09/07/2014, 08/16/2018   Influenza-Unspecified 08/04/2015, 07/29/2017   Tdap 09/03/2014    Health Maintenance  Topic Date Due   INFLUENZA VACCINE  06/04/2023   COVID-19 Vaccine (1 - 2023-24 season) 06/24/2023 (Originally 07/04/2022)   DTaP/Tdap/Td (2 - Td or Tdap) 09/03/2024   PAP SMEAR-Modifier  04/07/2025   Hepatitis C Screening  Completed   HIV Screening  Completed   HPV VACCINES  Aged Out    Discussed health benefits of physical activity, and encouraged her to engage in regular exercise appropriate for her age and condition. Marland KitchenMarland Kitchen  Candita was seen today for annual exam.  Diagnoses and all orders for this visit:  Routine physical examination -     TSH -     CBC with Differential/Platelet -     CMP14+EGFR -     Lipid panel -     B12 and Folate Panel -     VITAMIN D 25 Hydroxy (Vit-D Deficiency, Fractures) -     Hemoglobin A1c  Numbness and tingling in both hands -     TSH -     CBC with Differential/Platelet -     CMP14+EGFR -     Lipid panel -     B12 and Folate Panel -     VITAMIN D 25 Hydroxy (Vit-D Deficiency, Fractures) -     Hemoglobin A1c -     NCV with EMG(electromyography); Future  Screening for diabetes mellitus -      CMP14+EGFR -     Hemoglobin A1c  Screening for lipid disorders -     Lipid panel  Moderate episode of recurrent major depressive disorder (HCC) -     buPROPion (WELLBUTRIN XL) 300 MG 24 hr tablet; Take 1 tablet (300 mg total) by mouth daily.  Bipolar 2 disorder (HCC) -     cariprazine (VRAYLAR) 3 MG capsule; Take 1 capsule (3 mg total) by mouth daily.  Attention deficit hyperactivity disorder (ADHD), predominantly hyperactive type -     amphetamine-dextroamphetamine (ADDERALL) 30 MG tablet; Take 1 tablet by mouth 2 (two) times daily. -     amphetamine-dextroamphetamine (ADDERALL) 30 MG tablet; Take 1 tablet by mouth 2 (two) times daily. -     amphetamine-dextroamphetamine (ADDERALL) 30 MG tablet; Take 1 tablet by mouth 2 (two) times daily.   . Discussed 150 minutes of exercise a week.  Encouraged vitamin D 1000 units and Calcium 1300mg  or 4 servings of dairy a day.  PHQ great-refilled medications Vitals look great Fasting labs ordered ADHD refilled for 3 months Pap UTD Recommended covid booster and flu shot R/O carpal tunnel with EMGs Will also check b12/Hemoglobin/vitamin D  Return in about 3 months (around 09/05/2023).     Tandy Gaw, PA-C

## 2023-06-11 ENCOUNTER — Encounter: Payer: Self-pay | Admitting: Physician Assistant

## 2023-06-11 DIAGNOSIS — R200 Anesthesia of skin: Secondary | ICD-10-CM

## 2023-06-11 DIAGNOSIS — R202 Paresthesia of skin: Secondary | ICD-10-CM

## 2023-06-12 ENCOUNTER — Other Ambulatory Visit (HOSPITAL_COMMUNITY): Payer: Self-pay

## 2023-06-19 NOTE — Telephone Encounter (Signed)
Hey, can you re-enter order as a referral to [facility] so it will fall in their workqueue? I will call and check PA Monday morning.

## 2023-06-25 ENCOUNTER — Other Ambulatory Visit (HOSPITAL_COMMUNITY): Payer: Self-pay

## 2023-06-30 ENCOUNTER — Other Ambulatory Visit (HOSPITAL_COMMUNITY): Payer: Self-pay

## 2023-07-01 ENCOUNTER — Other Ambulatory Visit: Payer: Self-pay | Admitting: Oncology

## 2023-07-01 DIAGNOSIS — Z006 Encounter for examination for normal comparison and control in clinical research program: Secondary | ICD-10-CM

## 2023-07-07 ENCOUNTER — Ambulatory Visit: Payer: Medicaid Other | Admitting: Physician Assistant

## 2023-07-14 ENCOUNTER — Ambulatory Visit (INDEPENDENT_AMBULATORY_CARE_PROVIDER_SITE_OTHER): Payer: Medicaid Other | Admitting: Physician Assistant

## 2023-07-14 ENCOUNTER — Encounter: Payer: Self-pay | Admitting: Physician Assistant

## 2023-07-14 ENCOUNTER — Ambulatory Visit (INDEPENDENT_AMBULATORY_CARE_PROVIDER_SITE_OTHER): Payer: Medicaid Other

## 2023-07-14 VITALS — BP 123/74 | HR 71 | Ht 59.0 in | Wt 118.0 lb

## 2023-07-14 DIAGNOSIS — R0781 Pleurodynia: Secondary | ICD-10-CM

## 2023-07-14 DIAGNOSIS — R0789 Other chest pain: Secondary | ICD-10-CM | POA: Diagnosis not present

## 2023-07-14 NOTE — Progress Notes (Unsigned)
   Acute Office Visit  Subjective:     Patient ID: Victoria Barnes, female    DOB: 14-Aug-1986, 37 y.o.   MRN: [id number]  No chief complaint on file.   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   Paresthesias 09/10/2016   Fatigue 02/23/2017   Bipolar 2 disorder (HCC) 07/07/2017   Dehydration 12/16/2017   Seasonal allergies 03/14/2018   Anxiety 09/01/2018   Acute stress reaction 02/07/2019   Tachycardia 05/02/2019   Alopecia 05/02/2019   Vaginal irritation 04/07/2022   Moderate episode of recurrent major depressive disorder (HCC) 11/12/2022   Restlessness 12/09/2022   Skin rash 12/09/2022   Scarring 03/11/2023   Hyperpigmentation 03/11/2023   Acne vulgaris 03/11/2023   Numbness and tingling in both hands 06/08/2023   Resolved Ambulatory Problems    Diagnosis Date Noted   Vitamin D deficiency 06/20/2015   Depression 07/17/2015   Abnormal weight gain 07/19/2015   Pain in joint, ankle and foot 08/02/2015   Exposure to strep throat 09/07/2015   Viral gastroenteritis 09/13/2015   Abscess of skin 09/13/2015   Pharyngitis 11/06/2015   Migraine without aura and with status migrainosus, not intractable 05/22/2016   Insomnia 05/22/2016   Nausea without vomiting 05/24/2016   Severe recurrent major depression without psychotic features (HCC) 06/24/2016   Dehydration 08/06/2016   Episodic mood disorder (HCC) 11/11/2016   Influenza-like illness 12/11/2016   Side effects of treatment 02/23/2017   Past Medical History:  Diagnosis Date   ADHD    Bipolar I disorder (HCC)      ROS  See HPI.     Objective:    There were no vitals taken for this visit. BP  Readings from Last 3 Encounters:  07/14/23 123/74  06/05/23 121/72  05/17/23 116/80   Wt Readings from Last 3 Encounters:  07/14/23 118 lb (53.5 kg)  06/05/23 114 lb (51.7 kg)  03/11/23 121 lb (54.9 kg)      Physical Exam Constitutional:      Appearance: Normal appearance.  HENT:     Head: Normocephalic.  Cardiovascular:     Rate and Rhythm: Normal rate.  Pulmonary:     Effort: Pulmonary effort is normal.     Comments: Pinpoint tenderness to palpation over right lower anterior ribs  Neurological:     Mental Status: She is alert and oriented to person, place, and time.  Psychiatric:        Mood and Affect: Mood normal.          Assessment & Plan:  Marland KitchenMarland KitchenElice was seen today for breast pain.  Diagnoses and all orders for this visit:  Rib pain on right side -     DG Ribs Unilateral W/Chest Right; Future     Tandy Gaw, PA-C

## 2023-07-15 ENCOUNTER — Encounter: Payer: Self-pay | Admitting: Physician Assistant

## 2023-07-15 DIAGNOSIS — R0789 Other chest pain: Secondary | ICD-10-CM | POA: Insufficient documentation

## 2023-07-15 DIAGNOSIS — R0781 Pleurodynia: Secondary | ICD-10-CM | POA: Insufficient documentation

## 2023-07-15 NOTE — Progress Notes (Signed)
No signs of fracture or mass. Will order breast imaging. Breast clinic [place] right location??

## 2023-07-15 NOTE — Progress Notes (Signed)
Ok order placed.  

## 2023-07-22 ENCOUNTER — Encounter: Payer: Self-pay | Admitting: Physician Assistant

## 2023-07-22 ENCOUNTER — Ambulatory Visit
Admission: RE | Admit: 2023-07-22 | Discharge: 2023-07-22 | Disposition: A | Discharge status: Home / Self Care | Payer: No Typology Code available for payment source | Source: Ambulatory Visit | Attending: Physician Assistant | Admitting: Physician Assistant

## 2023-07-22 DIAGNOSIS — R222 Localized swelling, mass and lump, trunk: Secondary | ICD-10-CM

## 2023-07-22 DIAGNOSIS — R0781 Pleurodynia: Secondary | ICD-10-CM

## 2023-07-22 DIAGNOSIS — R0789 Other chest pain: Secondary | ICD-10-CM

## 2023-07-22 NOTE — Progress Notes (Signed)
Looks like benign hematoma(blood collection) in the muscle. Massage and use heat but if not improving or feeling like mass enlarging will get MRI of chest.

## 2023-07-31 ENCOUNTER — Encounter: Payer: Self-pay | Admitting: Physician Assistant

## 2023-08-03 ENCOUNTER — Other Ambulatory Visit: Payer: Self-pay | Admitting: Physician Assistant

## 2023-08-03 DIAGNOSIS — R200 Anesthesia of skin: Secondary | ICD-10-CM

## 2023-08-05 ENCOUNTER — Other Ambulatory Visit (HOSPITAL_COMMUNITY): Payer: Self-pay

## 2023-08-05 ENCOUNTER — Other Ambulatory Visit: Payer: Self-pay

## 2023-08-05 ENCOUNTER — Ambulatory Visit
Admission: RE | Admit: 2023-08-05 | Discharge: 2023-08-05 | Disposition: A | Discharge status: Home / Self Care | Payer: No Typology Code available for payment source | Source: Ambulatory Visit | Attending: Physician Assistant | Admitting: Physician Assistant

## 2023-08-05 DIAGNOSIS — Z09 Encounter for follow-up examination after completed treatment for conditions other than malignant neoplasm: Secondary | ICD-10-CM | POA: Diagnosis not present

## 2023-08-05 DIAGNOSIS — R222 Localized swelling, mass and lump, trunk: Secondary | ICD-10-CM

## 2023-08-05 DIAGNOSIS — S20211A Contusion of right front wall of thorax, initial encounter: Secondary | ICD-10-CM | POA: Diagnosis not present

## 2023-08-05 MED ORDER — GADOPICLENOL 0.5 MMOL/ML IV SOLN
5.0000 mL | Freq: Once | INTRAVENOUS | Status: AC | PRN
  Administered 2023-08-05: 5 mL via INTRAVENOUS

## 2023-08-06 ENCOUNTER — Other Ambulatory Visit (HOSPITAL_COMMUNITY): Payer: Self-pay

## 2023-08-06 ENCOUNTER — Other Ambulatory Visit: Payer: Self-pay | Admitting: Physician Assistant

## 2023-08-06 DIAGNOSIS — L700 Acne vulgaris: Secondary | ICD-10-CM

## 2023-08-06 DIAGNOSIS — L819 Disorder of pigmentation, unspecified: Secondary | ICD-10-CM

## 2023-08-07 ENCOUNTER — Other Ambulatory Visit: Payer: Self-pay

## 2023-08-11 ENCOUNTER — Other Ambulatory Visit (HOSPITAL_COMMUNITY): Payer: Self-pay

## 2023-08-11 MED ORDER — TRETINOIN 0.1 % EX CREA
TOPICAL_CREAM | Freq: Every day | CUTANEOUS | 1 refills | Status: DC
Start: 2023-08-11 — End: 2023-11-03
  Filled 2023-08-11: qty 45, 30d supply, fill #0
  Filled 2023-09-09: qty 45, 30d supply, fill #1

## 2023-08-12 ENCOUNTER — Other Ambulatory Visit: Payer: Self-pay

## 2023-08-12 ENCOUNTER — Other Ambulatory Visit (HOSPITAL_COMMUNITY): Payer: Self-pay

## 2023-08-18 NOTE — Progress Notes (Signed)
Normal chest MR. Are you still having the pain?

## 2023-08-19 NOTE — Progress Notes (Signed)
Could be some inflammation of soft tissue there despite no abnormality being seen. Let me know if resolved in the next 2 weeks.

## 2023-09-09 ENCOUNTER — Other Ambulatory Visit: Payer: Self-pay | Admitting: Physician Assistant

## 2023-09-09 ENCOUNTER — Encounter (HOSPITAL_COMMUNITY): Payer: Self-pay

## 2023-09-09 ENCOUNTER — Other Ambulatory Visit: Payer: Self-pay

## 2023-09-09 ENCOUNTER — Other Ambulatory Visit (HOSPITAL_COMMUNITY): Payer: Self-pay

## 2023-09-09 DIAGNOSIS — F3181 Bipolar II disorder: Secondary | ICD-10-CM

## 2023-09-10 ENCOUNTER — Other Ambulatory Visit: Payer: Self-pay

## 2023-09-11 ENCOUNTER — Other Ambulatory Visit (HOSPITAL_COMMUNITY): Payer: Self-pay

## 2023-09-11 MED ORDER — CARIPRAZINE HCL 3 MG PO CAPS
3.0000 mg | ORAL_CAPSULE | Freq: Every day | ORAL | 1 refills | Status: DC
Start: 2023-09-11 — End: 2023-11-03
  Filled 2023-09-11: qty 30, 30d supply, fill #0
  Filled 2023-10-06: qty 30, 30d supply, fill #1

## 2023-09-20 ENCOUNTER — Ambulatory Visit
Admission: RE | Admit: 2023-09-20 | Discharge: 2023-09-20 | Disposition: A | Discharge status: Home / Self Care | Payer: Medicaid Other | Source: Ambulatory Visit | Attending: Internal Medicine | Admitting: Internal Medicine

## 2023-09-20 VITALS — BP 115/81 | HR 72 | Temp 98.2°F | Resp 18 | Ht 59.0 in | Wt 115.0 lb

## 2023-09-20 DIAGNOSIS — N898 Other specified noninflammatory disorders of vagina: Secondary | ICD-10-CM | POA: Insufficient documentation

## 2023-09-20 DIAGNOSIS — Z113 Encounter for screening for infections with a predominantly sexual mode of transmission: Secondary | ICD-10-CM | POA: Insufficient documentation

## 2023-09-20 DIAGNOSIS — R3915 Urgency of urination: Secondary | ICD-10-CM | POA: Insufficient documentation

## 2023-09-20 LAB — POCT URINALYSIS DIP (MANUAL ENTRY)
Bilirubin, UA: NEGATIVE
Glucose, UA: NEGATIVE mg/dL
Ketones, POC UA: NEGATIVE mg/dL
Nitrite, UA: NEGATIVE
Protein Ur, POC: NEGATIVE mg/dL
Spec Grav, UA: >=1.03 — AB (ref 1.010–1.025)
Urobilinogen, UA: 1 U/dL
pH, UA: 6 (ref 5.0–8.0)

## 2023-09-20 NOTE — ED Provider Notes (Signed)
[facility]    CSN: [id number] Arrival date & time: 09/20/23  1326      History   Chief Complaint Chief Complaint  Patient presents with   Vaginal Itching    Entered by patient    HPI Victoria Barnes is a 37 y.o. female.   Patient presents with vaginal irritation, vaginal discharge, vaginal itching, urinary urgency that started about 4 days ago.  Reports that her significant other has recently been unfaithful but denies any confirmed exposure to STD.  Denies dysuria, hematuria, vaginal bleeding.  Last menstrual cycle was 09/10/2023.  Denies any fever, abdominal pain, back pain, pelvic pain.   Vaginal Itching    Past Medical History:  Diagnosis Date   ADHD    Bipolar I disorder (HCC)    Depression    Eczema 2012   Migraine without status migrainosus, not intractable 02/26/2016    Patient Active Problem List   Diagnosis Date Noted   Right-sided chest wall pain 07/15/2023   Rib pain on right side 07/15/2023   Numbness and tingling in both hands 06/08/2023   Scarring 03/11/2023   Hyperpigmentation 03/11/2023   Acne vulgaris 03/11/2023   Restlessness 12/09/2022   Skin rash 12/09/2022   Moderate episode of recurrent major depressive disorder (HCC) 11/12/2022   Vaginal irritation 04/07/2022   Tachycardia 05/02/2019   Alopecia 05/02/2019   Acute stress reaction 02/07/2019   Anxiety 09/01/2018   Seasonal allergies 03/14/2018   Dehydration 12/16/2017   Bipolar 2 disorder (HCC) 07/07/2017   Fatigue 02/23/2017   Paresthesias 09/10/2016   Situational mixed anxiety and depressive disorder 06/17/2016   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 Authorizing Provider  amphetamine-dextroamphetamine (ADDERALL) 30 MG tablet Take 1 tablet by mouth 2 (two) times daily. 06/05/23  Yes Breeback, Jade L, PA-C  amphetamine-dextroamphetamine (ADDERALL) 30 MG tablet Take 1 tablet by mouth 2 (two) times daily. 07/06/23  Yes Breeback, Jade L, PA-C  buPROPion (WELLBUTRIN XL) 300 MG 24 hr tablet Take 1 tablet (300 mg total) by mouth daily. 06/05/23  Yes Breeback, Jade L, PA-C  cariprazine (VRAYLAR) 3 MG capsule Take 1 capsule (3 mg total) by mouth daily. 09/11/23  Yes Breeback, Jade L, PA-C  clonazePAM (KLONOPIN) 1 MG tablet Take 1 tablet (1 mg total) by mouth daily as needed for acute anxiety and panic. 12/12/22  Yes Breeback, Jade L, PA-C  valACYclovir (VALTREX) 1000 MG tablet TAKE 2 TABLETS TWICE A DAY FOR ONE DAY FOR OUTBREAK. 08/27/22  Yes Breeback, Jade L, PA-C  amphetamine-dextroamphetamine (ADDERALL) 30 MG tablet Take 1 tablet by mouth 2 (two) times daily. 08/05/23  Breeback, Jade L, PA-C  cholecalciferol (VITAMIN D3) 25 MCG (1000 UNIT) tablet Take 1,000 Units by mouth daily.    [provider]  fexofenadine (ALLEGRA ALLERGY) 180 MG tablet Take 1 tablet (180 mg total) by mouth daily. 03/10/22   Breeback, Jade L, PA-C  fluticasone (FLONASE) 50 MCG/ACT nasal spray USE 1 SPRAY IN EACH NOSTRIL ONCE A DAY 08/27/22 08/27/23  Breeback, Jade L, PA-C  Hydroquinone 8 % EMUL Apply 1 Application topically 2 (two) times daily. For hyperpigmentation. 03/11/23   Breeback, Jade L, PA-C  Hydroquinone-HC-Tretinoin 8-1-0.05 % EMUL Apply 1 application  topically daily. 03/11/23   Breeback, Jade L, PA-C  propranolol (INDERAL) 10 MG tablet Take 1-3 tablets (10-30 mg total) by mouth up to  3 (three) times daily  for restlessness. 01/16/23   Breeback, Jade L, PA-C  rizatriptan (MAXALT-MLT) 10 MG disintegrating tablet TAKE 1 TABLET BY MOUTH AS NEEDED FOR MIGRAINE. MAY REPEAT IN 2 HOURS IF NEEDED 06/05/21 11/12/22  Breeback, Jade L, PA-C  tretinoin (RETIN-A) 0.1 % cream Apply topically at bedtime. 08/11/23   Jomarie Longs, PA-C    Family History Family History  Problem Relation Age of Onset   Hyperlipidemia Mother    Personality disorder Maternal Grandmother    Depression Maternal Grandmother    Mood Disorder Maternal Grandmother     Social History Social History   Tobacco Use   Smoking status: Former    Current packs/day: 0.00    Types: Cigarettes    Quit date: 11/2014    Years since quitting: 8.8   Smokeless tobacco: Never   Tobacco comments:    quit 2 years  Vaping Use   Vaping status: Never Used  Substance Use Topics   Alcohol use: Yes    Comment: once or twice a month - socially   Drug use: No     Allergies   Lamictal [lamotrigine], Antacid [alum & mag hydroxide-simeth], Keflex [cephalexin], Penicillins, Trintellix [vortioxetine], and Viibryd [vilazodone hcl]   Review of Systems Review of Systems Per HPI  Physical Exam Triage Vital Signs ED Triage Vitals  Encounter Vitals Group     BP 09/20/23 1423 115/81     Systolic BP Percentile --      Diastolic BP Percentile --      Pulse Rate 09/20/23 1423 72     Resp 09/20/23 1423 18     Temp 09/20/23 1423 98.2 F (36.8 C)     Temp Source 09/20/23 1423 Oral     SpO2 09/20/23 1423 98 %     Weight 09/20/23 1421 115 lb (52.2 kg)     Height 09/20/23 1421 4\' 11"  (1.499 m)     Head Circumference --      Peak Flow --      Pain Score 09/20/23 1421 0     Pain Loc --      Pain Education --      Exclude from Growth Chart --    No data found.  Updated Vital Signs BP 115/81 (BP Location: Left Arm)   Pulse 72   Temp 98.2 F (36.8 C) (Oral)   Resp 18   Ht 4\' 11"  (1.499 m)   Wt 115 lb (52.2 kg)   LMP 09/10/2023   SpO2 98%    BMI 23.23 kg/m   Visual Acuity Right Eye Distance:   Left Eye Distance:   Bilateral Distance:    Right Eye Near:   Left Eye Near:    Bilateral Near:  Physical Exam Constitutional:      General: She is not in acute distress.    Appearance: Normal appearance. She is not toxic-appearing or diaphoretic.  HENT:     Head: Normocephalic and atraumatic.  Eyes:     Extraocular Movements: Extraocular movements intact.     Conjunctiva/sclera: Conjunctivae normal.  Pulmonary:     Effort: Pulmonary effort is normal.  Genitourinary:    Comments: Deferred with shared decision making. Self swab performed.  Neurological:     General: No focal deficit present.     Mental Status: She is alert and oriented to person, place, and time. Mental status is at baseline.  Psychiatric:        Mood and Affect: Mood normal.        Behavior: Behavior normal.        Thought Content: Thought content normal.        Judgment: Judgment normal.      UC Treatments / Results  Labs (all labs ordered are listed, but only abnormal results are displayed) Labs Reviewed  POCT URINALYSIS DIP (MANUAL ENTRY) - Abnormal; Notable for the following components:      Result Value   Clarity, UA hazy (*)    Spec Grav, UA >=1.030 (*)    Blood, UA small (*)    Leukocytes, UA Trace (*)    All other components within normal limits  URINE CULTURE  CERVICOVAGINAL ANCILLARY ONLY    EKG   Radiology No results found.  Procedures Procedures (including critical care time)  Medications Ordered in UC Medications - No data to display  Initial Impression / Assessment and Plan / UC Course  I have reviewed the triage vital signs and the nursing notes.  Pertinent labs & imaging results that were available during my care of the patient were reviewed by me and considered in my medical decision making (see chart for details).     UA showing trace leukocytes so will send for urine culture prior to treatment.   Cervicovaginal swab pending.  Awaiting results prior to treatment.  Advised to refrain from sexual activity until test results and treatment are complete.  Advised strict follow-up precautions.  Patient verbalized understanding and was agreeable with plan. Final Clinical Impressions(s) / UC Diagnoses   Final diagnoses:  Vaginal discharge  Screening examination for venereal disease  Urinary urgency     Discharge Instructions      Vaginal swab and urine culture pending.  We will call with the result and send any appropriate treatment.    ED Prescriptions   None    PDMP not reviewed this encounter.   Gustavus Bryant, [place] 09/20/23 [phone]

## 2023-09-20 NOTE — ED Triage Notes (Signed)
Pt states that she has vaginal irritation, vaginal itching and swelling of the vagina. X4 days

## 2023-09-20 NOTE — Discharge Instructions (Signed)
Vaginal swab and urine culture pending.  We will call with the result and send any appropriate treatment.

## 2023-09-21 LAB — CERVICOVAGINAL ANCILLARY ONLY
Bacterial Vaginitis (gardnerella): NEGATIVE
Candida Glabrata: NEGATIVE
Candida Vaginitis: NEGATIVE
Chlamydia: NEGATIVE
Comment: NEGATIVE
Comment: NEGATIVE
Comment: NEGATIVE
Comment: NEGATIVE
Comment: NEGATIVE
Comment: NORMAL
Neisseria Gonorrhea: NEGATIVE
Trichomonas: NEGATIVE

## 2023-09-22 ENCOUNTER — Encounter: Payer: Self-pay | Admitting: Neurology

## 2023-09-22 ENCOUNTER — Ambulatory Visit: Payer: Medicaid Other | Admitting: Neurology

## 2023-09-22 ENCOUNTER — Other Ambulatory Visit: Payer: Self-pay

## 2023-09-22 VITALS — BP 128/76 | HR 95 | Ht 59.0 in | Wt 116.4 lb

## 2023-09-22 DIAGNOSIS — R200 Anesthesia of skin: Secondary | ICD-10-CM

## 2023-09-22 LAB — URINE CULTURE: Culture: NO GROWTH

## 2023-09-22 NOTE — Progress Notes (Signed)
[facility]  PATIENT: Victoria Barnes DOB: [birth year]  REFERRING DOCTOR OR PCP: Tandy Gaw, PA-C SOURCE: Patient, note from primary care  _________________________________   HISTORICAL  CHIEF COMPLAINT:  Chief Complaint  Patient presents with   New Patient (Initial Visit)    Pt in room 11 alone.. New patient here for Paresthesias, numbness and tingling in both hands and feet. Pt she noticed symptoms in June, pt said when waking up she noticed hands are numb. Pt said when she is active moving her hand no numbness, worsen on right side, then left side. Same with right side of leg numbness only happened about twice.     HISTORY OF PRESENT ILLNESS:  I had the pleasure of seeing a patient, Victoria Barnes, at [facility] for neurologic consultation regarding her numbness.  She is a 37 yo woman who noted numbness in her hands around August 2024.   She reports the numbness is the hands, right > left.  She has had milder less frequent numbness in her feet.    The sensation is pins/needles.   She does not note it much during the day and the numbness occurs more while laying still, sometimes while sitting.   When preent, if she shakes the hand, the symptoms improve      She denies weakness.   She feels her gait is fine.   Balance is fine and she can go downstairs without the bannister..     Bladder function is fine.   Vision is fine.     She has ADD and Biopolar and is on medication with benefit.   She is otherwise healthy.  REVIEW OF SYSTEMS: Constitutional: No fevers, chills, sweats, or change in appetite Eyes: No visual changes, double vision, eye pain Ear, nose and throat: No hearing loss, ear pain, nasal congestion, sore throat Cardiovascular: No chest pain, palpitations Respiratory:  No shortness of breath at rest or with exertion.   No wheezes GastrointestinaI: No nausea, vomiting, diarrhea, abdominal pain, fecal incontinence Genitourinary:   No dysuria, urinary retention or frequency.  No nocturia. Musculoskeletal:  No neck pain, back pain Integumentary: No rash, pruritus, skin lesions Neurological: as above Psychiatric: She has bipolar disease. Endocrine: No palpitations, diaphoresis, change in appetite, change in weigh or increased thirst Hematologic/Lymphatic:  No anemia, purpura, petechiae. Allergic/Immunologic: No itchy/runny eyes, nasal congestion, recent allergic reactions, rashes  ALLERGIES: Allergies  Allergen Reactions   Lamictal [Lamotrigine] Rash    Possible SJS   Antacid [Alum & Mag Hydroxide-Simeth] Nausea And Vomiting   Keflex [Cephalexin] Nausea And Vomiting   Penicillins Nausea And Vomiting   Trintellix [Vortioxetine]     Swelling/numbness and tingling of extremities.    Viibryd [Vilazodone Hcl]     Abnormal Frightening Dreams    HOME MEDICATIONS:  Current Outpatient Medications:    amphetamine-dextroamphetamine (ADDERALL) 30 MG tablet, Take 1 tablet by mouth 2 (two) times daily., Disp: 60 tablet, Rfl: 0   buPROPion (WELLBUTRIN XL) 300 MG 24 hr tablet, Take 1 tablet (300 mg total) by mouth daily., Disp: 90 tablet, Rfl: 1   cariprazine (VRAYLAR) 3 MG capsule, Take 1 capsule (3 mg total) by mouth daily., Disp: 30 capsule, Rfl: 1   cholecalciferol (VITAMIN D3) 25 MCG (1000 UNIT) tablet, Take 1,000 Units by mouth daily., Disp: , Rfl:    clonazePAM (KLONOPIN) 1 MG tablet, Take 1 tablet (1 mg total) by mouth daily as needed for acute anxiety and panic., Disp: 30 tablet, Rfl: 1  fexofenadine (ALLEGRA ALLERGY) 180 MG tablet, Take 1 tablet (180 mg total) by mouth daily., Disp: 90 tablet, Rfl: 3   Hydroquinone 8 % EMUL, Apply 1 Application topically 2 (two) times daily. For hyperpigmentation., Disp: 30 g, Rfl: 1   Hydroquinone-HC-Tretinoin 8-1-0.05 % EMUL, Apply 1 application  topically daily., Disp: 30 g, Rfl: 2   propranolol (INDERAL) 10 MG tablet, Take 1-3 tablets (10-30 mg total) by mouth up to  3 (three)  times daily for restlessness., Disp: 270 tablet, Rfl: 1   tretinoin (RETIN-A) 0.1 % cream, Apply topically at bedtime., Disp: 45 g, Rfl: 1   valACYclovir (VALTREX) 1000 MG tablet, TAKE 2 TABLETS TWICE A DAY FOR ONE DAY FOR OUTBREAK., Disp: 30 tablet, Rfl: 1   amphetamine-dextroamphetamine (ADDERALL) 30 MG tablet, Take 1 tablet by mouth 2 (two) times daily. (Patient not taking: Reported on 09/22/2023), Disp: 60 tablet, Rfl: 0   amphetamine-dextroamphetamine (ADDERALL) 30 MG tablet, Take 1 tablet by mouth 2 (two) times daily. (Patient not taking: Reported on 09/22/2023), Disp: 60 tablet, Rfl: 0   fluticasone (FLONASE) 50 MCG/ACT nasal spray, USE 1 SPRAY IN EACH NOSTRIL ONCE A DAY, Disp: 16 g, Rfl: 3   rizatriptan (MAXALT-MLT) 10 MG disintegrating tablet, TAKE 1 TABLET BY MOUTH AS NEEDED FOR MIGRAINE. MAY REPEAT IN 2 HOURS IF NEEDED, Disp: 10 tablet, Rfl: 5  PAST MEDICAL HISTORY: Past Medical History:  Diagnosis Date   ADHD    Bipolar I disorder (HCC)    Depression    Eczema 2012   Migraine without status migrainosus, not intractable 02/26/2016    PAST SURGICAL HISTORY: Past Surgical History:  Procedure Laterality Date   BREAST ENHANCEMENT SURGERY Bilateral June 2015   WISDOM TOOTH EXTRACTION      FAMILY HISTORY: Family History  Problem Relation Age of Onset   Hyperlipidemia Mother    Personality disorder Maternal Grandmother    Depression Maternal Grandmother    Mood Disorder Maternal Grandmother     SOCIAL HISTORY: Social History   Socioeconomic History   Marital status: Single    Spouse name: Not on file   Number of children: 1   Years of education: Not on file   Highest education level: Bachelor's degree (e.g., BA, AB, BS)  Occupational History   Occupation: CMA  Tobacco Use   Smoking status: Former    Current packs/day: 0.00    Types: Cigarettes    Quit date: 11/2014    Years since quitting: 8.8   Smokeless tobacco: Never   Tobacco comments:    quit 2 years   Vaping Use   Vaping status: Never Used  Substance and Sexual Activity   Alcohol use: Yes    Comment: once or twice a month - socially   Drug use: No   Sexual activity: Yes    Partners: Male    Birth control/protection: None    Comment: condoms  Other Topics Concern   Not on file  Social History Narrative   Right handed   Wear glasses and contacts   Drinks 1 cup of coffee per day    Drink soda once per week    Social Determinants of Health   Financial Resource Strain: Patient Declined (03/10/2023)   Overall Financial Resource Strain (CARDIA)    Difficulty of Paying Living Expenses: Patient declined  Food Insecurity: Patient Declined (03/10/2023)   Hunger Vital Sign    Worried About Running Out of Food in the Last Year: Patient declined    Ran Out of  Food in the Last Year: Patient declined  Transportation Needs: No Transportation Needs (03/10/2023)   PRAPARE - Administrator, Civil Service (Medical): No    Lack of Transportation (Non-Medical): No  Physical Activity: Unknown (03/10/2023)   Exercise Vital Sign    Days of Exercise per Week: 4 days    Minutes of Exercise per Session: Patient declined  Stress: No Stress Concern Present (03/10/2023)   [employer] of Occupational Health - Occupational Stress Questionnaire    Feeling of Stress : Only a little  Social Connections: Unknown (03/10/2023)   Social Connection and Isolation Panel [NHANES]    Frequency of Communication with Friends and Family: Patient declined    Frequency of Social Gatherings with Friends and Family: Patient declined    Attends Religious Services: Patient declined    Database administrator or Organizations: No    Attends Engineer, structural: Not on file    Marital Status: Patient declined  Intimate Partner Violence: Not At Risk (09/10/2017)   Humiliation, Afraid, Rape, and Kick questionnaire    Fear of Current or Ex-Partner: No    Emotionally Abused: No    Physically Abused: No     Sexually Abused: No       PHYSICAL EXAM  Vitals:   09/22/23 1416  BP: 128/76  Pulse: 95  Weight: 116 lb 6.4 oz (52.8 kg)  Height: 4\' 11"  (1.499 m)    Body mass index is 23.51 kg/m.   General: The patient is well-developed and well-nourished and in no acute distress  HEENT:  Head is [place]/AT.  Sclera are anicteric.   Neck: No carotid bruits are noted.  The neck is nontender.  Cardiovascular: The heart has a regular rate and rhythm with a normal S1 and S2. There were no murmurs, gallops or rubs.    Skin: Extremities are without rash or  edema.  Musculoskeletal:  Back is nontender  Neurologic Exam  Mental status: The patient is alert and oriented x 3 at the time of the examination. The patient has apparent normal recent and remote memory, with an apparently normal attention span and concentration ability.   Speech is normal.  Cranial nerves: Extraocular movements are full. Pupils are equal, round, and reactive to light and accomodation.   There is good facial sensation to soft touch bilaterally.Facial strength is normal.  Trapezius and sternocleidomastoid strength is normal. No dysarthria is noted.  The tongue is midline, and the patient has symmetric elevation of the soft palate. No obvious hearing deficits are noted.  Motor:  Muscle bulk is normal.   Tone is normal. Strength is  5 / 5 in all 4 extremities.   Sensory: She does not have Tinel signs.  She has a slight Phalen sign on the right.  Sensory testing is intact to pinprick, soft touch and vibration sensation in all 4 extremities.  Coordination: Cerebellar testing reveals good finger-nose-finger and heel-to-shin bilaterally.  Gait and station: Station is normal.   Gait is normal. Tandem gait is normal. Romberg is negative.   Reflexes: Deep tendon reflexes are symmetric and normal bilaterally.   Plantar responses are flexor.    DIAGNOSTIC DATA (LABS, IMAGING, TESTING) - I reviewed patient records, labs, notes,  testing and imaging myself where available.  Lab Results  Component Value Date   WBC 6.9 06/05/2023   HGB 14.1 06/05/2023   HCT 43.1 06/05/2023   MCV 92 06/05/2023   PLT 215 06/05/2023      Component  Value Date/Time   NA 137 06/05/2023 1406   K 4.0 06/05/2023 1406   CL 103 06/05/2023 1406   CO2 21 06/05/2023 1406   GLUCOSE 70 06/05/2023 1406   GLUCOSE 78 07/11/2022 0000   BUN 9 06/05/2023 1406   CREATININE 0.86 06/05/2023 1406   CREATININE 0.77 07/11/2022 0000   CALCIUM 9.8 06/05/2023 1406   PROT 7.2 06/05/2023 1406   ALBUMIN 4.4 06/05/2023 1406   AST 21 06/05/2023 1406   ALT 16 06/05/2023 1406   ALKPHOS 65 06/05/2023 1406   BILITOT 0.5 06/05/2023 1406   GFRNONAA 110 03/27/2020 1353   GFRAA 127 03/27/2020 1353   Lab Results  Component Value Date   CHOL 205 (H) 06/05/2023   HDL 76 06/05/2023   LDLCALC 115 (H) 06/05/2023   TRIG 76 06/05/2023   CHOLHDL 2.7 06/05/2023   Lab Results  Component Value Date   HGBA1C 5.5 06/05/2023   Lab Results  Component Value Date   VITAMINB12 706 06/05/2023   Lab Results  Component Value Date   TSH 0.741 06/05/2023       ASSESSMENT AND PLAN  Right arm numbness - Plan: MR CERVICAL SPINE WO CONTRAST   In summary, Victoria Barnes is a 37 year old woman with right greater than left hand numbness and milder right greater than left foot numbness.  Symptoms fluctuate but occur most commonly when she is laying down.  She does not have any weakness, bladder issues or other neurologic symptoms.  The description of the pain/numbness could be most consistent with carpal tunnel syndrome in the hands.  However, she did not have a Tinel's or Phalen sign.  Because she has numbness in both the hands and the feet, we need to check an MRI of the cervical spine to ensure that there is not a cervical spinal cord process such as a compressive myelopathy or demyelination.  Additionally, I advised her wearing wrist splint on the right wrist at night which  might be of benefit if this is carpal tunnel syndrome.  If the MRI does not show any significant pathology and wrist plates are not of benefit, we will consider an NCV/EMG study, especially if symptoms worsen.  She will return to see me as needed and will call if she has new or worsening symptoms or she gets no benefit  Thank you for asking me to see Victoria Barnes.  Please let me know if I can be of further assistance with her or other patients in the future.      Delores Thelen A. Epimenio Foot, MD, [facility] 09/22/2023, 3:51 PM Certified in Neurology, Clinical Neurophysiology, Sleep Medicine and Neuroimaging  [facility] [address], [address] [place], [place] [address] [phone]

## 2023-10-06 ENCOUNTER — Other Ambulatory Visit: Payer: Self-pay

## 2023-10-08 ENCOUNTER — Telehealth: Payer: Self-pay | Admitting: Neurology

## 2023-10-08 NOTE — Telephone Encounter (Signed)
[facility]- [id number] exp. 10/08/23-12/06/23 for GI

## 2023-10-14 ENCOUNTER — Other Ambulatory Visit: Payer: Medicaid Other

## 2023-10-14 ENCOUNTER — Ambulatory Visit
Admission: RE | Admit: 2023-10-14 | Discharge: 2023-10-14 | Disposition: A | Discharge status: Home / Self Care | Payer: Self-pay | Source: Ambulatory Visit | Attending: Neurology | Admitting: Neurology

## 2023-10-14 DIAGNOSIS — G43909 Migraine, unspecified, not intractable, without status migrainosus: Secondary | ICD-10-CM | POA: Diagnosis not present

## 2023-10-14 DIAGNOSIS — R202 Paresthesia of skin: Secondary | ICD-10-CM | POA: Diagnosis not present

## 2023-10-14 DIAGNOSIS — R200 Anesthesia of skin: Secondary | ICD-10-CM

## 2023-10-22 DIAGNOSIS — N951 Menopausal and female climacteric states: Secondary | ICD-10-CM | POA: Diagnosis not present

## 2023-10-22 DIAGNOSIS — R5383 Other fatigue: Secondary | ICD-10-CM | POA: Diagnosis not present

## 2023-10-30 DIAGNOSIS — Z6823 Body mass index (BMI) 23.0-23.9, adult: Secondary | ICD-10-CM | POA: Diagnosis not present

## 2023-10-30 DIAGNOSIS — R5383 Other fatigue: Secondary | ICD-10-CM | POA: Diagnosis not present

## 2023-10-30 DIAGNOSIS — N951 Menopausal and female climacteric states: Secondary | ICD-10-CM | POA: Diagnosis not present

## 2023-10-30 DIAGNOSIS — R232 Flushing: Secondary | ICD-10-CM | POA: Diagnosis not present

## 2023-11-03 ENCOUNTER — Other Ambulatory Visit (HOSPITAL_BASED_OUTPATIENT_CLINIC_OR_DEPARTMENT_OTHER): Payer: Self-pay

## 2023-11-03 ENCOUNTER — Other Ambulatory Visit: Payer: Self-pay

## 2023-11-03 ENCOUNTER — Other Ambulatory Visit: Payer: Self-pay | Admitting: Physician Assistant

## 2023-11-03 DIAGNOSIS — F901 Attention-deficit hyperactivity disorder, predominantly hyperactive type: Secondary | ICD-10-CM

## 2023-11-03 DIAGNOSIS — L700 Acne vulgaris: Secondary | ICD-10-CM

## 2023-11-03 DIAGNOSIS — F3181 Bipolar II disorder: Secondary | ICD-10-CM

## 2023-11-03 DIAGNOSIS — L819 Disorder of pigmentation, unspecified: Secondary | ICD-10-CM

## 2023-11-06 ENCOUNTER — Other Ambulatory Visit (HOSPITAL_COMMUNITY): Payer: Self-pay

## 2023-11-06 ENCOUNTER — Other Ambulatory Visit: Payer: Self-pay

## 2023-11-06 MED ORDER — AMPHETAMINE-DEXTROAMPHETAMINE 30 MG PO TABS: 30.0000 mg | ORAL_TABLET | Freq: Two times a day (BID) | ORAL | 0 refills | Status: DC

## 2023-11-06 MED ORDER — TRETINOIN 0.1 % EX CREA
TOPICAL_CREAM | Freq: Every day | CUTANEOUS | 1 refills | Status: DC
Start: 2023-11-06 — End: 2024-01-23
  Filled 2023-11-06: qty 45, 30d supply, fill #0
  Filled 2023-12-22: qty 45, 30d supply, fill #1

## 2023-11-06 MED ORDER — CARIPRAZINE HCL 3 MG PO CAPS
3.0000 mg | ORAL_CAPSULE | Freq: Every day | ORAL | 1 refills | Status: DC
Start: 2023-11-06 — End: 2024-02-16
  Filled 2023-11-06: qty 90, 90d supply, fill #0
  Filled 2024-02-04: qty 90, 90d supply, fill #1

## 2023-11-06 MED ORDER — AMPHETAMINE-DEXTROAMPHETAMINE 30 MG PO TABS
30.0000 mg | ORAL_TABLET | Freq: Two times a day (BID) | ORAL | 0 refills | Status: DC
  Filled 2023-11-06: qty 60, 30d supply, fill #0

## 2023-11-06 MED ORDER — AMPHETAMINE-DEXTROAMPHETAMINE 30 MG PO TABS
30.0000 mg | ORAL_TABLET | Freq: Two times a day (BID) | ORAL | 0 refills | Status: DC
Start: 2023-12-06 — End: 2024-09-02
  Filled 2024-01-23: qty 60, 30d supply, fill #0

## 2023-11-07 ENCOUNTER — Other Ambulatory Visit: Payer: Self-pay

## 2023-11-09 ENCOUNTER — Other Ambulatory Visit: Payer: Self-pay

## 2023-11-24 ENCOUNTER — Ambulatory Visit: Payer: Medicaid Other | Admitting: Dermatology

## 2023-11-24 ENCOUNTER — Encounter: Payer: Self-pay | Admitting: Dermatology

## 2023-11-24 ENCOUNTER — Other Ambulatory Visit (HOSPITAL_COMMUNITY): Payer: Self-pay

## 2023-11-24 VITALS — BP 102/68

## 2023-11-24 DIAGNOSIS — L810 Postinflammatory hyperpigmentation: Secondary | ICD-10-CM

## 2023-11-24 DIAGNOSIS — L700 Acne vulgaris: Secondary | ICD-10-CM

## 2023-11-24 MED ORDER — SPIRONOLACTONE 100 MG PO TABS
100.0000 mg | ORAL_TABLET | Freq: Every day | ORAL | 3 refills | Status: DC
  Filled 2023-11-24: qty 30, 30d supply, fill #0
  Filled 2023-12-22: qty 30, 30d supply, fill #1
  Filled 2024-01-23 – 2024-01-27 (×2): qty 30, 30d supply, fill #2
  Filled 2024-02-21: qty 30, 30d supply, fill #3

## 2023-11-24 MED ORDER — SAFETY SEAL MISCELLANEOUS MISC: 1.0000 "application " | Freq: Every morning | 3 refills | Status: AC

## 2023-11-24 NOTE — Progress Notes (Signed)
   New Patient Visit   Subjective  Victoria Barnes is a 38 y.o. female who presents for the following: Dark spots of back and shoulders. She was prescribed Hydroquinone 8%/Tretinoin 0.05% compound and it lightened the old spots but new spots appeared. She was also prescribed Retin-A but it was on backorder so she never got it. .   The following portions of the chart were reviewed this encounter and updated as appropriate: medications, allergies, medical history  Review of Systems:  No other skin or systemic complaints except as noted in HPI or Assessment and Plan.  Objective  Well appearing patient in no apparent distress; mood and affect are within normal limits.   A focused examination was performed of the following areas: back   Relevant exam findings are noted in the Assessment and Plan.            Assessment & Plan   ACNE VULGARIS Exam: Open comedones and inflammatory papules   Treatment Plan: Spironolactone 100 mg 1 tablet daily in the morning  Continue Tretinoin as prescribed  Patient Education: Spironolactone can cause increased urination and cause blood pressure to decrease. Please watch for signs of lightheadedness and be cautious when changing position. It can sometimes cause breast tenderness or an irregular period in premenopausal women. It can also increase potassium. The increase in potassium usually is not a concern unless you are taking other medicines that also increase potassium, so please be sure your doctor knows all of the other medications you are taking. This medication should not be taken by pregnant women.  This medicine should also not be taken together with sulfa drugs like Bactrim (trimethoprim/sulfamethexazole).   Topical retinoid medications like tretinoin/Retin-A, adapalene/Differin, tazarotene/Fabior, and Epiduo/Epiduo Forte can cause dryness and irritation when first started. Only apply a pea-sized amount to the entire affected area.  Avoid applying it around the eyes, edges of mouth and creases at the nose. If you experience irritation, use a good moisturizer first and/or apply the medicine less often. If you are doing well with the medicine, you can increase how often you use it until you are applying every night. Be careful with sun protection while using this medication as it can make you sensitive to the sun. This medicine should not be used by pregnant women.      POST-INFLAMMATORY HYPERPIGMENTATION (PIH) Exam: hyperpigmented macules and/or patches at face   This is a benign condition that comes from having previous inflammation in the skin and will fade with time over months to sometimes years. Recommend daily sun protection including sunscreen SPF 30+ to sun-exposed areas. - Recommend treating any itchy or red areas on the skin quickly to prevent new areas of PIH. Treating with prescription medicines such as hydroquinone may help fade dark spots faster.    Treatment Plan:   Discontinue Hydroquinone compound. Start Melaxemic cream (Tranexamic acid/Kojic Acid/ Vit C/Tretinoin/Hydrocortisone/Hyaluronic acid     Return in about 4 months (around 03/23/2024) for Acne.  I, Joanie Coddington, CMA, am acting as scribe for [employer], DO .   Documentation: I have reviewed the above documentation for accuracy and completeness, and I agree with the above.  Langston Reusing, DO

## 2023-11-24 NOTE — Patient Instructions (Addendum)
Dear Victoria Barnes,  Thank you for visiting my office today. Your dedication to enhancing your skin health is greatly appreciated. Below is a summary of our discussion and your personalized treatment plan:  Medications Prescribed:   Spironolactone: Take one tablet daily. Important: Discontinue immediately if pregnancy occurs.   Tretinoin: Maintain application on both your face and back as directed.   Tranexamic Acid Compound: This should be applied twice daily on your back, mixed with your moisturizer. It includes vitamin C and niacinamide for enhanced benefits. A compounding pharmacy will reach out to you with further details.  Lifestyle and Treatment Notes:   Improvement Timeline: Expect to see noticeable improvements in dark spots within approximately 3 months.   Pregnancy Precaution: It is crucial to avoid pregnancy while on these medications due to potential risks to developing babies.   Side Effects Monitoring: Specifically for Spironolactone, be vigilant for signs of lightheadedness or dizziness. Please report any such side effects through MyChart.  Follow-Up:   Progress Tracking: We have documented your current condition with photographs today for comparison during your next visit, which is scheduled in 3 months.  Should you have any questions or concerns before your next appointment, do not hesitate to reach out.  Best regards,  Dr. [facility]      Spironolactone can cause increased urination and cause blood pressure to decrease. Please watch for signs of lightheadedness and be cautious when changing position. It can sometimes cause breast tenderness or an irregular period in premenopausal women. It can also increase potassium. The increase in potassium usually is not a concern unless you are taking other medicines that also increase potassium, so please be sure your doctor knows all of the other medications you are taking. This medication should not be taken by  pregnant women.  This medicine should also not be taken together with sulfa drugs like Bactrim (trimethoprim/sulfamethexazole).    Topical retinoid medications like tretinoin/Retin-A, adapalene/Differin, tazarotene/Fabior, and Epiduo/Epiduo Forte can cause dryness and irritation when first started. Only apply a pea-sized amount to the entire affected area. Avoid applying it around the eyes, edges of mouth and creases at the nose. If you experience irritation, use a good moisturizer first and/or apply the medicine less often. If you are doing well with the medicine, you can increase how often you use it until you are applying every night. Be careful with sun protection while using this medication as it can make you sensitive to the sun. This medicine should not be used by pregnant women.     Important Information  Due to recent changes in healthcare laws, you may see results of your pathology and/or laboratory studies on MyChart before the doctors have had a chance to review them. We understand that in some cases there may be results that are confusing or concerning to you. Please understand that not all results are received at the same time and often the doctors may need to interpret multiple results in order to provide you with the best plan of care or course of treatment. Therefore, we ask that you please give [place] 2 business days to thoroughly review all your results before contacting the office for clarification. Should we see a critical lab result, you will be contacted sooner.   If You Need Anything After Your Visit  If you have any questions or concerns for your doctor, please call our main line at [phone] If no one answers, please leave a voicemail as directed and we will return your  call as soon as possible. Messages left after 4 pm will be answered the following business day.   You may also send [place] a message via MyChart. We typically respond to MyChart messages within 1-2 business  days.  For prescription refills, please ask your pharmacy to contact our office. Our fax number is [phone].  If you have an urgent issue when the clinic is closed that cannot wait until the next business day, you can page your doctor at the number below.    Please note that while we do our best to be available for urgent issues outside of office hours, we are not available 24/7.   If you have an urgent issue and are unable to reach [place], you may choose to seek medical care at your doctor's office, retail clinic, urgent care center, or emergency room.  If you have a medical emergency, please immediately call 911 or go to the emergency department. In the event of inclement weather, please call our main line at [phone] for an update on the status of any delays or closures.  Dermatology Medication Tips: Please keep the boxes that topical medications come in in order to help keep track of the instructions about where and how to use these. Pharmacies typically print the medication instructions only on the boxes and not directly on the medication tubes.   If your medication is too expensive, please contact our office at [phone] or send [place] a message through MyChart.   We are unable to tell what your co-pay for medications will be in advance as this is different depending on your insurance coverage. However, we may be able to find a substitute medication at lower cost or fill out paperwork to get insurance to cover a needed medication.   If a prior authorization is required to get your medication covered by your insurance company, please allow [place] 1-2 business days to complete this process.  Drug prices often vary depending on where the prescription is filled and some pharmacies may offer cheaper prices.  The website www.goodrx.com contains coupons for medications through different pharmacies. The prices here do not account for what the cost may be with help from insurance (it may be  cheaper with your insurance), but the website can give you the price if you did not use any insurance.  - You can print the associated coupon and take it with your prescription to the pharmacy.  - You may also stop by our office during regular business hours and pick up a GoodRx coupon card.  - If you need your prescription sent electronically to a different pharmacy, notify our office through [facility] or by phone at [phone]

## 2023-12-22 ENCOUNTER — Other Ambulatory Visit (HOSPITAL_COMMUNITY): Payer: Self-pay

## 2024-01-23 ENCOUNTER — Other Ambulatory Visit: Payer: Self-pay | Admitting: Physician Assistant

## 2024-01-23 ENCOUNTER — Other Ambulatory Visit (HOSPITAL_COMMUNITY): Payer: Self-pay

## 2024-01-23 DIAGNOSIS — J302 Other seasonal allergic rhinitis: Secondary | ICD-10-CM

## 2024-01-23 DIAGNOSIS — R451 Restlessness and agitation: Secondary | ICD-10-CM

## 2024-01-23 DIAGNOSIS — L700 Acne vulgaris: Secondary | ICD-10-CM

## 2024-01-23 DIAGNOSIS — T887XXA Unspecified adverse effect of drug or medicament, initial encounter: Secondary | ICD-10-CM

## 2024-01-23 DIAGNOSIS — L819 Disorder of pigmentation, unspecified: Secondary | ICD-10-CM

## 2024-01-25 ENCOUNTER — Other Ambulatory Visit: Payer: Self-pay

## 2024-01-25 MED ORDER — TRETINOIN 0.1 % EX CREA
TOPICAL_CREAM | Freq: Every day | CUTANEOUS | 1 refills | Status: DC
Start: 2024-01-25 — End: 2024-05-30
  Filled 2024-01-25: qty 45, 30d supply, fill #0
  Filled 2024-02-21: qty 45, 30d supply, fill #1

## 2024-01-25 MED ORDER — PROPRANOLOL HCL 10 MG PO TABS
10.0000 mg | ORAL_TABLET | Freq: Three times a day (TID) | ORAL | 1 refills | Status: AC
Start: 2024-01-25 — End: ?
  Filled 2024-01-25: qty 270, 30d supply, fill #0

## 2024-01-25 MED ORDER — FLUTICASONE PROPIONATE 50 MCG/ACT NA SUSP
1.0000 | Freq: Every day | NASAL | 3 refills | Status: AC
Start: 2024-01-25 — End: ?
  Filled 2024-01-25: qty 16, 60d supply, fill #0

## 2024-01-26 ENCOUNTER — Other Ambulatory Visit: Payer: Self-pay

## 2024-01-27 ENCOUNTER — Other Ambulatory Visit (HOSPITAL_COMMUNITY): Payer: Self-pay

## 2024-01-27 ENCOUNTER — Other Ambulatory Visit: Payer: Self-pay

## 2024-01-28 ENCOUNTER — Other Ambulatory Visit: Payer: Self-pay

## 2024-01-28 ENCOUNTER — Other Ambulatory Visit (HOSPITAL_COMMUNITY): Payer: Self-pay

## 2024-01-28 ENCOUNTER — Other Ambulatory Visit: Payer: Self-pay | Admitting: Physician Assistant

## 2024-01-28 DIAGNOSIS — F331 Major depressive disorder, recurrent, moderate: Secondary | ICD-10-CM

## 2024-01-28 MED ORDER — BUPROPION HCL ER (XL) 300 MG PO TB24
300.0000 mg | ORAL_TABLET | Freq: Every day | ORAL | 1 refills | Status: DC
  Filled 2024-01-28: qty 90, 90d supply, fill #0

## 2024-02-04 ENCOUNTER — Other Ambulatory Visit (HOSPITAL_COMMUNITY): Payer: Self-pay

## 2024-02-16 ENCOUNTER — Telehealth: Admitting: Physician Assistant

## 2024-02-16 ENCOUNTER — Other Ambulatory Visit: Payer: Self-pay

## 2024-02-16 ENCOUNTER — Other Ambulatory Visit (HOSPITAL_COMMUNITY): Payer: Self-pay

## 2024-02-16 ENCOUNTER — Encounter: Payer: Self-pay | Admitting: Physician Assistant

## 2024-02-16 DIAGNOSIS — F3181 Bipolar II disorder: Secondary | ICD-10-CM

## 2024-02-16 DIAGNOSIS — F331 Major depressive disorder, recurrent, moderate: Secondary | ICD-10-CM

## 2024-02-16 DIAGNOSIS — F901 Attention-deficit hyperactivity disorder, predominantly hyperactive type: Secondary | ICD-10-CM | POA: Diagnosis not present

## 2024-02-16 MED ORDER — AMPHETAMINE-DEXTROAMPHETAMINE 30 MG PO TABS
30.0000 mg | ORAL_TABLET | Freq: Two times a day (BID) | ORAL | 0 refills | Status: DC
  Filled 2024-05-30: qty 60, 30d supply, fill #0

## 2024-02-16 MED ORDER — CARIPRAZINE HCL 3 MG PO CAPS
3.0000 mg | ORAL_CAPSULE | Freq: Every day | ORAL | 1 refills | Status: DC
  Filled 2024-02-16 – 2024-05-03 (×2): qty 90, 90d supply, fill #0
  Filled 2024-08-01: qty 90, 90d supply, fill #1

## 2024-02-16 MED ORDER — AMPHETAMINE-DEXTROAMPHETAMINE 30 MG PO TABS
30.0000 mg | ORAL_TABLET | Freq: Two times a day (BID) | ORAL | 0 refills | Status: DC
  Filled 2024-02-16: qty 60, 30d supply, fill #0

## 2024-02-16 MED ORDER — BUPROPION HCL ER (XL) 150 MG PO TB24
450.0000 mg | ORAL_TABLET | Freq: Every morning | ORAL | 1 refills | Status: DC
  Filled 2024-02-16: qty 270, 90d supply, fill #0
  Filled 2024-05-15: qty 270, 90d supply, fill #1

## 2024-02-16 MED ORDER — AMPHETAMINE-DEXTROAMPHETAMINE 30 MG PO TABS
30.0000 mg | ORAL_TABLET | Freq: Two times a day (BID) | ORAL | 0 refills | Status: DC
Start: 2024-04-16 — End: 2024-09-02
  Filled 2024-08-01: qty 60, 30d supply, fill #0

## 2024-02-16 NOTE — Progress Notes (Signed)
..  Virtual Visit via Video Note  I connected with Victoria Barnes on 02/16/24 at  9:30 AM EDT by a video enabled telemedicine application and verified that I am speaking with the correct person using two identifiers.  Location: Patient: home Provider: clinic  .Aaron AasParticipating in visit:  Patient: Victoria Barnes Provider: Sandy Crumb PA-C   I discussed the limitations of evaluation and management by telemedicine and the availability of in person appointments. The patient expressed understanding and agreed to proceed.  History of Present Illness: Pt is a 38 yo female with ADHD, MDD, Bipolar who needs refills. She is doing ok. She is working and keeping a job. She feels more down. Loss of motivation. She would like to increase wellbutrin. Denies any SI/HC.     Observations/Objective: No acute distress Normal mood and appearance   Assessment and Plan: Aaron AasAaron AasDiagnoses and all orders for this visit:  Attention deficit hyperactivity disorder (ADHD), predominantly hyperactive type -     amphetamine-dextroamphetamine (ADDERALL) 30 MG tablet; Take 1 tablet by mouth 2 (two) times daily. -     amphetamine-dextroamphetamine (ADDERALL) 30 MG tablet; Take 1 tablet by mouth 2 (two) times daily. -     amphetamine-dextroamphetamine (ADDERALL) 30 MG tablet; Take 1 tablet by mouth 2 (two) times daily. -     buPROPion (WELLBUTRIN XL) 150 MG 24 hr tablet; Take 3 tablets (450 mg total) by mouth in the morning.  Bipolar 2 disorder (HCC) -     cariprazine (VRAYLAR) 3 MG capsule; Take 1 capsule (3 mg total) by mouth daily.  Moderate episode of recurrent major depressive disorder (HCC) -     cariprazine (VRAYLAR) 3 MG capsule; Take 1 capsule (3 mg total) by mouth daily. -     buPROPion (WELLBUTRIN XL) 150 MG 24 hr tablet; Take 3 tablets (450 mg total) by mouth in the morning.   Continue adderall and vraylar Increased wellbutrin to 450mg  daily Follow up in 3 months   Follow Up Instructions:    I discussed the  assessment and treatment plan with the patient. The patient was provided an opportunity to ask questions and all were answered. The patient agreed with the plan and demonstrated an understanding of the instructions.   The patient was advised to call back or seek an in-person evaluation if the symptoms worsen or if the condition fails to improve as anticipated.   Tyris Eliot, PA-C

## 2024-02-21 ENCOUNTER — Other Ambulatory Visit: Payer: Self-pay | Admitting: Physician Assistant

## 2024-02-21 DIAGNOSIS — F419 Anxiety disorder, unspecified: Secondary | ICD-10-CM

## 2024-02-22 ENCOUNTER — Other Ambulatory Visit: Payer: Self-pay

## 2024-02-23 ENCOUNTER — Other Ambulatory Visit (HOSPITAL_COMMUNITY): Payer: Self-pay

## 2024-02-23 MED ORDER — CLONAZEPAM 1 MG PO TABS
1.0000 mg | ORAL_TABLET | Freq: Every day | ORAL | 2 refills | Status: AC
  Filled 2024-02-23: qty 30, 30d supply, fill #0

## 2024-03-24 ENCOUNTER — Ambulatory Visit: Payer: Medicaid Other | Admitting: Dermatology

## 2024-04-06 ENCOUNTER — Telehealth: Admitting: Physician Assistant

## 2024-04-06 DIAGNOSIS — B3731 Acute candidiasis of vulva and vagina: Secondary | ICD-10-CM

## 2024-04-06 MED ORDER — FLUCONAZOLE 150 MG PO TABS: 150.0000 mg | ORAL_TABLET | ORAL | 0 refills | Status: DC | PRN

## 2024-04-06 NOTE — Progress Notes (Signed)

## 2024-04-08 ENCOUNTER — Ambulatory Visit: Admitting: Physician Assistant

## 2024-04-20 ENCOUNTER — Ambulatory Visit: Admitting: Physician Assistant

## 2024-04-20 ENCOUNTER — Encounter: Payer: Self-pay | Admitting: Physician Assistant

## 2024-04-20 VITALS — BP 114/67 | HR 67 | Ht 59.0 in | Wt 115.0 lb

## 2024-04-20 DIAGNOSIS — H1032 Unspecified acute conjunctivitis, left eye: Secondary | ICD-10-CM | POA: Diagnosis not present

## 2024-04-20 DIAGNOSIS — Z113 Encounter for screening for infections with a predominantly sexual mode of transmission: Secondary | ICD-10-CM

## 2024-04-20 DIAGNOSIS — Z79899 Other long term (current) drug therapy: Secondary | ICD-10-CM

## 2024-04-20 DIAGNOSIS — F901 Attention-deficit hyperactivity disorder, predominantly hyperactive type: Secondary | ICD-10-CM | POA: Diagnosis not present

## 2024-04-20 DIAGNOSIS — E559 Vitamin D deficiency, unspecified: Secondary | ICD-10-CM

## 2024-04-20 MED ORDER — POLYMYXIN B-TRIMETHOPRIM 10000-0.1 UNIT/ML-% OP SOLN: 1.0000 [drp] | OPHTHALMIC | 0 refills | Status: DC

## 2024-04-20 NOTE — Patient Instructions (Signed)
 Start eye drops for eye irritation.  Warm compresses.

## 2024-04-21 LAB — BMP8+EGFR
BUN/Creatinine Ratio: 15 (ref 9–23)
BUN: 12 mg/dL (ref 6–20)
CO2: 18 mmol/L — ABNORMAL LOW (ref 20–29)
Calcium: 9.5 mg/dL (ref 8.7–10.2)
Chloride: 105 mmol/L (ref 96–106)
Creatinine, Ser: 0.79 mg/dL (ref 0.57–1.00)
Glucose: 68 mg/dL — ABNORMAL LOW (ref 70–99)
Potassium: 4.3 mmol/L (ref 3.5–5.2)
Sodium: 140 mmol/L (ref 134–144)
eGFR: 99 mL/min/{1.73_m2} (ref >59)

## 2024-04-21 LAB — RPR: RPR Ser Ql: NONREACTIVE

## 2024-04-21 LAB — HIV ANTIBODY (ROUTINE TESTING W REFLEX): HIV Screen 4th Generation wRfx: NONREACTIVE

## 2024-04-21 LAB — VITAMIN D 25 HYDROXY (VIT D DEFICIENCY, FRACTURES): Vit D, 25-Hydroxy: 44.1 ng/mL (ref 30.0–100.0)

## 2024-04-22 ENCOUNTER — Ambulatory Visit: Payer: Self-pay | Admitting: Physician Assistant

## 2024-04-22 ENCOUNTER — Encounter: Payer: Self-pay | Admitting: Physician Assistant

## 2024-04-22 LAB — GC/CHLAMYDIA PROBE AMP
Chlamydia trachomatis, NAA: NEGATIVE
Neisseria Gonorrhoeae by PCR: NEGATIVE

## 2024-04-22 NOTE — Progress Notes (Signed)
 Victoria Barnes,   Vitamin D  looks better! Negative for STDs.  Glucose low. Make sure to eat small frequent meals and look out for any low sugar symptoms and eat accordingly.

## 2024-04-22 NOTE — Progress Notes (Signed)
 Acute Office Visit  Subjective:     Patient ID: Victoria Barnes, female    DOB: 1986/02/06, 38 y.o.   MRN: [id number]  Chief Complaint  Patient presents with   Medical Management of Chronic Issues    STI TESTING    HPI Patient is in today for left eye watering and irritated for last few days and need for STD screening.   Pt has no STD symptoms she just needs testing.   Pt denies any fever, chills, sinus pressure, headache, vision changes, ear pain. She denies any known eye trauma. She has not tried anything to make better. For the last few days her left eye has watered and been irritated underneath.   Pt is doing great on adderall and with mood medications. No concerns.   .. 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   Paresthesias 09/10/2016   Fatigue 02/23/2017   Bipolar 2 disorder (HCC) 07/07/2017   Dehydration 12/16/2017   Seasonal allergies 03/14/2018   Anxiety 09/01/2018   Acute stress reaction 02/07/2019   Tachycardia 05/02/2019   Alopecia 05/02/2019   Vaginal irritation 04/07/2022   Moderate episode of recurrent major depressive disorder (HCC) 11/12/2022   Restlessness 12/09/2022   Skin rash 12/09/2022   Scarring 03/11/2023   Hyperpigmentation 03/11/2023   Acne vulgaris 03/11/2023   Numbness and tingling in both hands 06/08/2023   Right-sided chest wall pain 07/15/2023   Rib pain on right side 07/15/2023   Resolved Ambulatory Problems    Diagnosis Date Noted   Vitamin D  deficiency 06/20/2015   Depression 07/17/2015   Abnormal weight gain 07/19/2015   Pain in joint, ankle and foot 08/02/2015   Exposure to strep throat 09/07/2015   Viral gastroenteritis 09/13/2015   Abscess of  skin 09/13/2015   Pharyngitis 11/06/2015   Migraine without aura and with status migrainosus, not intractable 05/22/2016   Insomnia 05/22/2016   Nausea without vomiting 05/24/2016   Severe recurrent major depression without psychotic features (HCC) 06/24/2016   Dehydration 08/06/2016   Episodic mood disorder (HCC) 11/11/2016   Influenza-like illness 12/11/2016   Side effects of treatment 02/23/2017   Past Medical History:  Diagnosis Date   ADHD    Bipolar I disorder (HCC)        Review of Systems  All other systems reviewed and are negative.       Objective:    BP 114/67   Pulse 67   Ht 4' 11 (1.499 m)   Wt 115 lb (52.2 kg)   SpO2 100%   BMI 23.23 kg/m  BP Readings from Last 3 Encounters:  04/20/24 114/67  11/24/23 102/68  09/22/23 128/76   Wt Readings from Last 3 Encounters:  04/20/24 115 lb (52.2 kg)  09/22/23 116 lb 6.4 oz (52.8 kg)  09/20/23 115 lb (52.2 kg)      Physical Exam Constitutional:      Appearance: Normal appearance.  HENT:     Head: Normocephalic.   Eyes:     Extraocular Movements: Extraocular movements intact.     Comments: Left eye is watery and injected in the lower part of eye underneath the lower eyelid. The meibomian glands look inflamed. Some tenderness to palpation of the lower eyelid.    Neurological:  Mental Status: She is alert.          Assessment & Plan:  Victoria Barnes was seen today for medical management of chronic issues.  Diagnoses and all orders for this visit:  Acute conjunctivitis of left eye, unspecified acute conjunctivitis type -     trimethoprim -polymyxin b (POLYTRIM) ophthalmic solution; Place 1 drop into the left eye every 4 (four) hours. For 5 days.  Screening for STD (sexually transmitted disease) -     GC/Chlamydia Probe Amp([facility]) -     HIV antibody (with reflex) -     RPR -     BMP8+eGFR  Medication management -     VITAMIN D  25 Hydroxy (Vit-D Deficiency, Fractures) -      BMP8+eGFR  Vitamin D  insufficiency -     VITAMIN D  25 Hydroxy (Vit-D Deficiency, Fractures)  Attention deficit hyperactivity disorder (ADHD), predominantly hyperactive type   STD screening ordered with urine and in labs today. Will treat accordingly.  Vitals look great.  Recheck vitamin d  due to low levels in the past.  Doing well with mood and focus. Ok for refills up to 6 months when need them next.   Left eye conjunctivitis with unilateral symptoms concern for bacterial even though purulent discharge absent.  Start polytrim with warm compresses.  Follow up as needed if symptoms persist or worsen.  No red flag symptoms today.       Return in about 6 months (around 10/20/2024).  Victoria Schorsch, PA-C

## 2024-04-28 ENCOUNTER — Ambulatory Visit: Admitting: Dermatology

## 2024-04-28 ENCOUNTER — Encounter: Payer: Self-pay | Admitting: Dermatology

## 2024-04-28 ENCOUNTER — Other Ambulatory Visit (HOSPITAL_COMMUNITY): Payer: Self-pay

## 2024-04-28 ENCOUNTER — Other Ambulatory Visit: Payer: Self-pay

## 2024-04-28 VITALS — BP 107/64

## 2024-04-28 DIAGNOSIS — L700 Acne vulgaris: Secondary | ICD-10-CM | POA: Diagnosis not present

## 2024-04-28 DIAGNOSIS — L810 Postinflammatory hyperpigmentation: Secondary | ICD-10-CM | POA: Diagnosis not present

## 2024-04-28 MED ORDER — SPIRONOLACTONE 100 MG PO TABS
100.0000 mg | ORAL_TABLET | Freq: Every day | ORAL | 3 refills | Status: DC
  Filled 2024-04-28: qty 30, 30d supply, fill #0
  Filled 2024-05-30: qty 30, 30d supply, fill #1
  Filled 2024-08-01: qty 30, 30d supply, fill #2
  Filled 2024-09-01: qty 30, 30d supply, fill #3

## 2024-04-28 NOTE — Patient Instructions (Addendum)
 Date: Thu Apr 28 2024  Dear Victoria Barnes,  Thank you for visiting today. Here is a summary of the key instructions:  - Medications:   - Continue taking spironolactone  for acne   - Use tretinoin  cream every night Monday through Friday   - Mix Eucerin Radiant Tone serum with tretinoin  at night  - Skin Care:   - Apply Eucerin Radiant Tone serum morning and night   - Be careful with hair oils to avoid triggering acne   - Reapply sunscreen every 4 hours when at the lake or pool  - Lifestyle:   - Drink enough water to avoid leg cramps from spironolactone    We look forward to seeing you at your next visit. If you have any questions or concerns before then, please do not hesitate to contact our office.  Warm regards,  Dr. [facility]   Important Information  Due to recent changes in healthcare laws, you may see results of your pathology and/or laboratory studies on MyChart before the doctors have had a chance to review them. We understand that in some cases there may be results that are confusing or concerning to you. Please understand that not all results are received at the same time and often the doctors may need to interpret multiple results in order to provide you with the best plan of care or course of treatment. Therefore, we ask that you please give us  2 business days to thoroughly review all your results before contacting the office for clarification. Should we see a critical lab result, you will be contacted sooner.   If You Need Anything After Your Visit  If you have any questions or concerns for your doctor, please call our main line at [phone] If no one answers, please leave a voicemail as directed and we will return your call as soon as possible. Messages left after 4 pm will be answered the following business day.   You may also send us  a message via MyChart. We typically respond to MyChart messages within 1-2 business days.  For prescription refills, please  ask your pharmacy to contact our office. Our fax number is [phone].  If you have an urgent issue when the clinic is closed that cannot wait until the next business day, you can page your doctor at the number below.    Please note that while we do our best to be available for urgent issues outside of office hours, we are not available 24/7.   If you have an urgent issue and are unable to reach us , you may choose to seek medical care at your doctor's office, retail clinic, urgent care center, or emergency room.  If you have a medical emergency, please immediately call 911 or go to the emergency department. In the event of inclement weather, please call our main line at [phone] for an update on the status of any delays or closures.  Dermatology Medication Tips: Please keep the boxes that topical medications come in in order to help keep track of the instructions about where and how to use these. Pharmacies typically print the medication instructions only on the boxes and not directly on the medication tubes.   If your medication is too expensive, please contact our office at [phone] or send us  a message through MyChart.   We are unable to tell what your co-pay for medications will be in advance as this is different depending on your insurance coverage. However, we may be able to find a substitute  medication at lower cost or fill out paperwork to get insurance to cover a needed medication.   If a prior authorization is required to get your medication covered by your insurance company, please allow us  1-2 business days to complete this process.  Drug prices often vary depending on where the prescription is filled and some pharmacies may offer cheaper prices.  The website www.goodrx.com contains coupons for medications through different pharmacies. The prices here do not account for what the cost may be with help from insurance (it may be cheaper with your insurance), but the website  can give you the price if you did not use any insurance.  - You can print the associated coupon and take it with your prescription to the pharmacy.  - You may also stop by our office during regular business hours and pick up a GoodRx coupon card.  - If you need your prescription sent electronically to a different pharmacy, notify our office through [facility] or by phone at [phone]

## 2024-04-28 NOTE — Progress Notes (Signed)
   Follow-Up Visit   Subjective  Victoria Barnes is a 38 y.o. female who presents for the following: Acne - PIH   Patient present today for follow up visit. Patient was last evaluated on 11/24/23. At this visit patient was prescribed Spirolactone, Tretinoin  0.1% (using MWF) & Medrock Melaxemic Cream. She was not able to obtain Medrock cream due to financial reasons. Patient reports sxs are better. Patient denies medication changes.  The following portions of the chart were reviewed this encounter and updated as appropriate: medications, allergies, medical history  Review of Systems:  No other skin or systemic complaints except as noted in HPI or Assessment and Plan.  Objective  Well appearing patient in no apparent distress; mood and affect are within normal limits.   A focused examination was performed of the following areas: shoulders & back   Relevant exam findings are noted in the Assessment and Plan.           Assessment & Plan   ACNE VULGARIS w/ PIH - Assessment: Patient reports significant improvement in acne breakouts since starting spironolactone  and tretinoin  in January. Occasional breakouts initially, but currently no new acne formation. Treatment regimen appears effective in controlling active acne lesions. Patient now primarily concerned with residual hyperpigmentation from previous acne lesions.  - Plan:    Continue current medications:     - Spironolactone  (dose not specified)     - Tretinoin  0.1% topically       - Increase application frequency to Monday through Friday nights    Add treatment for hyperpigmentation:     - Recommend Excedrin Radiant Tone serum       - Apply morning and night, can be mixed with tretinoin  at night    Patient education:     - Proper hair care to prevent acne flares:       - Avoid hair oils on back and scalp     - Sun protection:       - Continue regular sunscreen use       - Reapply sunscreen every 4 hours when outdoors,  especially near water    Follow up as needed   No follow-ups on file.    Documentation: I have reviewed the above documentation for accuracy and completeness, and I agree with the above.   I, Shirron Maranda, CMA, am acting as scribe for [employer], DO.   Delon Lenis, DO

## 2024-05-04 ENCOUNTER — Other Ambulatory Visit: Payer: Self-pay

## 2024-05-04 ENCOUNTER — Other Ambulatory Visit (HOSPITAL_COMMUNITY): Payer: Self-pay

## 2024-05-16 ENCOUNTER — Other Ambulatory Visit: Payer: Self-pay

## 2024-05-30 ENCOUNTER — Other Ambulatory Visit (HOSPITAL_COMMUNITY): Payer: Self-pay

## 2024-05-30 ENCOUNTER — Other Ambulatory Visit: Payer: Self-pay | Admitting: Physician Assistant

## 2024-05-30 ENCOUNTER — Other Ambulatory Visit: Payer: Self-pay

## 2024-05-30 DIAGNOSIS — L700 Acne vulgaris: Secondary | ICD-10-CM

## 2024-05-30 DIAGNOSIS — B009 Herpesviral infection, unspecified: Secondary | ICD-10-CM

## 2024-05-30 DIAGNOSIS — L819 Disorder of pigmentation, unspecified: Secondary | ICD-10-CM

## 2024-05-30 MED ORDER — TRETINOIN 0.1 % EX CREA
TOPICAL_CREAM | Freq: Every day | CUTANEOUS | 1 refills | Status: DC
  Filled 2024-05-30: qty 45, 30d supply, fill #0
  Filled 2024-08-01: qty 45, 30d supply, fill #1

## 2024-05-30 MED ORDER — VALACYCLOVIR HCL 1 G PO TABS
ORAL_TABLET | ORAL | 1 refills | Status: AC
  Filled 2024-05-30: qty 30, 7d supply, fill #0

## 2024-08-01 ENCOUNTER — Other Ambulatory Visit: Payer: Self-pay

## 2024-08-01 ENCOUNTER — Other Ambulatory Visit (HOSPITAL_COMMUNITY): Payer: Self-pay

## 2024-08-12 ENCOUNTER — Other Ambulatory Visit: Payer: Self-pay | Admitting: Medical Genetics

## 2024-08-12 DIAGNOSIS — Z006 Encounter for examination for normal comparison and control in clinical research program: Secondary | ICD-10-CM

## 2024-08-20 ENCOUNTER — Telehealth: Admitting: Physician Assistant

## 2024-08-20 DIAGNOSIS — R3989 Other symptoms and signs involving the genitourinary system: Secondary | ICD-10-CM | POA: Diagnosis not present

## 2024-08-20 MED ORDER — NITROFURANTOIN MONOHYD MACRO 100 MG PO CAPS: 100.0000 mg | ORAL_CAPSULE | Freq: Two times a day (BID) | ORAL | 0 refills | Status: DC

## 2024-08-20 MED ORDER — FLUCONAZOLE 150 MG PO TABS: ORAL_TABLET | ORAL | 0 refills | Status: DC

## 2024-08-20 NOTE — Progress Notes (Signed)
 We are sorry that you are not feeling well.  Here is how we plan to help!  Based on what you shared with me it looks like you most likely have a simple urinary tract infection.  A UTI (Urinary Tract Infection) is a bacterial infection of the bladder.  Most cases of urinary tract infections are simple to treat but a key part of your care is to encourage you to drink plenty of fluids and watch your symptoms carefully.  I have prescribed MacroBid  100 mg twice a day for 5 days.  Your symptoms should gradually improve. Call us  if the burning in your urine worsens, you develop worsening fever, back pain or pelvic pain or if your symptoms do not resolve after completing the antibiotic. I have sent in a course of Diflucan , just in case of yeast.   Urinary tract infections can be prevented by drinking plenty of water to keep your body hydrated.  Also be sure when you wipe, wipe from front to back and don't hold it in!  If possible, empty your bladder every 4 hours.  Your e-visit answers were reviewed by a board certified advanced clinical practitioner to complete your personal care plan.  Depending on the condition, your plan could have included both over the counter or prescription medications.  If there is a problem please reply  once you have received a response from your provider.  Your safety is important to us .  If you have drug allergies check your prescription carefully.    You can use MyChart to ask questions about today's visit, request a non-urgent call back, or ask for a work or school excuse for 24 hours related to this e-Visit. If it has been greater than 24 hours you will need to follow up with your provider, or enter a new e-Visit to address those concerns.   You will get an e-mail in the next two days asking about your experience.  I hope that your e-visit has been valuable and will speed your recovery. Thank you for using e-visits.  I have spent 5 minutes in review of e-visit  questionnaire, review and updating patient chart, medical decision making and response to patient.   Elsie Velma Lunger, PA-C

## 2024-09-01 ENCOUNTER — Other Ambulatory Visit (HOSPITAL_BASED_OUTPATIENT_CLINIC_OR_DEPARTMENT_OTHER): Payer: Self-pay

## 2024-09-01 ENCOUNTER — Other Ambulatory Visit: Payer: Self-pay | Admitting: Physician Assistant

## 2024-09-01 ENCOUNTER — Other Ambulatory Visit: Payer: Self-pay

## 2024-09-01 ENCOUNTER — Other Ambulatory Visit (HOSPITAL_COMMUNITY): Payer: Self-pay

## 2024-09-01 DIAGNOSIS — F901 Attention-deficit hyperactivity disorder, predominantly hyperactive type: Secondary | ICD-10-CM

## 2024-09-01 DIAGNOSIS — L700 Acne vulgaris: Secondary | ICD-10-CM

## 2024-09-01 DIAGNOSIS — L819 Disorder of pigmentation, unspecified: Secondary | ICD-10-CM

## 2024-09-01 DIAGNOSIS — F331 Major depressive disorder, recurrent, moderate: Secondary | ICD-10-CM

## 2024-09-01 MED ORDER — BUPROPION HCL ER (XL) 150 MG PO TB24
450.0000 mg | ORAL_TABLET | Freq: Every morning | ORAL | 1 refills | Status: AC
  Filled 2024-09-01: qty 270, 90d supply, fill #0

## 2024-09-01 MED ORDER — TRETINOIN 0.1 % EX CREA
TOPICAL_CREAM | Freq: Every day | CUTANEOUS | 1 refills | Status: AC
Start: 2024-09-01 — End: ?
  Filled 2024-09-01: qty 45, 30d supply, fill #0

## 2024-09-02 ENCOUNTER — Ambulatory Visit

## 2024-09-02 ENCOUNTER — Ambulatory Visit: Admitting: Physician Assistant

## 2024-09-02 ENCOUNTER — Other Ambulatory Visit (HOSPITAL_COMMUNITY): Payer: Self-pay

## 2024-09-02 ENCOUNTER — Encounter: Payer: Self-pay | Admitting: Physician Assistant

## 2024-09-02 VITALS — BP 133/76 | HR 103 | Ht 59.0 in | Wt 119.0 lb

## 2024-09-02 DIAGNOSIS — F901 Attention-deficit hyperactivity disorder, predominantly hyperactive type: Secondary | ICD-10-CM

## 2024-09-02 DIAGNOSIS — R1114 Bilious vomiting: Secondary | ICD-10-CM | POA: Diagnosis not present

## 2024-09-02 DIAGNOSIS — M25561 Pain in right knee: Secondary | ICD-10-CM | POA: Diagnosis not present

## 2024-09-02 MED ORDER — AMPHETAMINE-DEXTROAMPHETAMINE 30 MG PO TABS
30.0000 mg | ORAL_TABLET | Freq: Two times a day (BID) | ORAL | 0 refills | Status: DC
Start: 2024-09-02 — End: 2024-09-02

## 2024-09-02 MED ORDER — AMPHETAMINE-DEXTROAMPHETAMINE 30 MG PO TABS: 30.0000 mg | ORAL_TABLET | Freq: Two times a day (BID) | ORAL | 0 refills | Status: AC

## 2024-09-02 MED ORDER — AMPHETAMINE-DEXTROAMPHETAMINE 30 MG PO TABS
30.0000 mg | ORAL_TABLET | Freq: Two times a day (BID) | ORAL | 0 refills | Status: AC
  Filled 2024-09-02 – 2024-11-01 (×2): qty 60, 30d supply, fill #0

## 2024-09-02 MED ORDER — AMPHETAMINE-DEXTROAMPHETAMINE 30 MG PO TABS
30.0000 mg | ORAL_TABLET | Freq: Two times a day (BID) | ORAL | 0 refills | Status: DC
Start: 2024-10-02 — End: 2024-09-02

## 2024-09-02 MED ORDER — DICLOFENAC SODIUM 1 % EX GEL
4.0000 g | Freq: Four times a day (QID) | CUTANEOUS | 1 refills | Status: AC
Start: 2024-09-02 — End: ?

## 2024-09-02 MED ORDER — FAMOTIDINE 40 MG PO TABS: 40.0000 mg | ORAL_TABLET | Freq: Every day | ORAL | 1 refills | Status: AC

## 2024-09-02 NOTE — Patient Instructions (Signed)
 Start pepcid with dinner Start Zyrtec D at bedtime Will refer to GI Start knee exercises, icing, bracing Consider diclofenac gel Get xray today

## 2024-09-02 NOTE — Progress Notes (Signed)
 Acute Office Visit  Subjective:     Patient ID: Victoria Barnes, female    DOB: 23-Oct-1986, 38 y.o.   MRN: [id number]  Chief Complaint  Patient presents with   Emesis    HPI Discussed the use of AI scribe software for clinical note transcription with the patient, who gave verbal consent to proceed.  History of Present Illness Victoria Barnes is a 38 year old female who presents with morning vomiting for the past 3-4 months, intermittently. She also needs refills of ADHD medication.   Morning emesis - Morning vomiting for the past 3-4 months, occurring randomly about once or twice per week - Emesis is immediate upon waking and often triggered by looking at the sink - Vomitus consists of mucus and bile - No associated nausea, abdominal pain, indigestion, dysphagia, or pain radiating to the back - Vomiting occurs before breakfast and is not associated with eating or indigestion - Acid reflux medications including omeprazole, pantoprazole, and Pepto-Bismol induce vomiting - Has not tried famotidine (Pepcid) - Denies possibility of pregnancy and is currently menstruating  Right knee pain - Knee injury sustained while dancing, with sensation of a 'little pop' in the knee - Pain localized to the medial aspect of the knee - No swelling or radiation of pain down the leg - Initially unable to walk for the first two days post-injury - Currently able to ambulate with tenderness and pain upon extension of the leg - Pain rated as 6-7/10 with weight-bearing or extension - Using ibuprofen  and acetaminophen  for pain management with some relief - Wearing a knee brace for support  ADHD - Doing well with no concerns - Needs refills    ROS See HPI.      Objective:    BP 133/76   Pulse (!) 103   Ht 4' 11 (1.499 m)   Wt 119 lb (54 kg)   SpO2 99%   BMI 24.04 kg/m  BP Readings from Last 3 Encounters:  09/02/24 133/76  04/28/24 107/64  04/20/24 114/67   Wt Readings from Last 3  Encounters:  09/02/24 119 lb (54 kg)  04/20/24 115 lb (52.2 kg)  09/22/23 116 lb 6.4 oz (52.8 kg)      Physical Exam Constitutional:      Appearance: Normal appearance.  HENT:     Head: Normocephalic.  Cardiovascular:     Rate and Rhythm: Normal rate and regular rhythm.  Pulmonary:     Effort: Pulmonary effort is normal.     Breath sounds: Normal breath sounds.  Abdominal:     General: There is no distension.     Palpations: Abdomen is soft. There is no mass.     Tenderness: There is no abdominal tenderness. There is no right CVA tenderness, left CVA tenderness, guarding or rebound.  Musculoskeletal:     Right lower leg: No edema.     Left lower leg: No edema.     Comments: Right knee:  No swelling, warmth, redness.  Tenderness over medial knee joint space and just below joint space.  NROM  Strength 5/5. \ Pains with McMurrays with stress put on medial knee No joint laxity  Neurological:     General: No focal deficit present.     Mental Status: She is alert and oriented to person, place, and time.  Psychiatric:        Mood and Affect: Mood normal.          Assessment & Plan:  SABRASABRARaygen was  seen today for emesis.  Diagnoses and all orders for this visit:  Bilious vomiting without nausea -     famotidine (PEPCID) 40 MG tablet; Take 1 tablet (40 mg total) by mouth at bedtime. -     Ambulatory referral to Gastroenterology  Acute pain of right knee -     DG Knee 4 Views W/Patella Right; Future -     diclofenac Sodium (VOLTAREN) 1 % GEL; Apply 4 g topically 4 (four) times daily. To affected joint.  Attention deficit hyperactivity disorder (ADHD), predominantly hyperactive type -     Discontinue: amphetamine -dextroamphetamine  (ADDERALL) 30 MG tablet; Take 1 tablet by mouth 2 (two) times daily. -     Discontinue: amphetamine -dextroamphetamine  (ADDERALL) 30 MG tablet; Take 1 tablet by mouth 2 (two) times daily. -     amphetamine -dextroamphetamine  (ADDERALL) 30 MG  tablet; Take 1 tablet by mouth 2 (two) times daily. -     amphetamine -dextroamphetamine  (ADDERALL) 30 MG tablet; Take 1 tablet by mouth 2 (two) times daily. -     amphetamine -dextroamphetamine  (ADDERALL) 30 MG tablet; Take 1 tablet by mouth 2 (two) times daily. -     amphetamine -dextroamphetamine  (ADDERALL) 30 MG tablet; Take 1 tablet by mouth 2 (two) times daily.   Assessment & Plan Morning vomiting, possible postnasal drip or acid-related Intermittent morning vomiting for 3-4 months, likely due to postnasal drip or acid-related causes. No red flags identified. Previous medication intolerance noted. - Try Pepcid with dinner. - Consider Flonase  or nasal spray for postnasal drip. - Consider Sudafed or Zyrtec D for decongestion. - Refer to GI for further evaluation and possible endoscopy.  Right knee pain, possible meniscal injury Acute right knee pain post-dancing incident, likely meniscal injury or inflammation. Pain localized to medial aspect, improving with weight-bearing. Potential for self-healing discussed. - Order right knee x-ray. - Continue RICE therapy. - Continue ibuprofen  and Tylenol  for 2-3 weeks. - Voltaren gel to try up to 4x a day over right knee - Provide knee strengthening exercises. - Consider MRI if symptoms persist.  Follow-Up Discussed follow-up plans for medication refills and further evaluation. - Send Adderall refill to home pharmacy. - Follow up with GI after trying Pepcid and Zyrtec D.    Victoria Boehning, PA-C

## 2024-09-05 ENCOUNTER — Encounter: Payer: Self-pay | Admitting: Physician Assistant

## 2024-09-07 ENCOUNTER — Ambulatory Visit: Payer: Self-pay | Admitting: Physician Assistant

## 2024-09-07 NOTE — Progress Notes (Signed)
 Swelling above the kneecap noted. No other acute findings.

## 2024-09-28 ENCOUNTER — Other Ambulatory Visit (HOSPITAL_COMMUNITY): Payer: Self-pay

## 2024-09-30 ENCOUNTER — Other Ambulatory Visit: Payer: Self-pay | Admitting: Dermatology

## 2024-09-30 ENCOUNTER — Other Ambulatory Visit (HOSPITAL_COMMUNITY): Payer: Self-pay

## 2024-09-30 ENCOUNTER — Other Ambulatory Visit: Payer: Self-pay

## 2024-10-01 ENCOUNTER — Encounter: Payer: Self-pay | Admitting: Physician Assistant

## 2024-10-01 DIAGNOSIS — M25561 Pain in right knee: Secondary | ICD-10-CM

## 2024-10-03 DIAGNOSIS — R1110 Vomiting, unspecified: Secondary | ICD-10-CM | POA: Diagnosis not present

## 2024-10-03 MED ORDER — SPIRONOLACTONE 100 MG PO TABS
100.0000 mg | ORAL_TABLET | Freq: Every day | ORAL | 3 refills | Status: AC
  Filled 2024-10-03: qty 30, 30d supply, fill #0

## 2024-10-04 ENCOUNTER — Other Ambulatory Visit (HOSPITAL_COMMUNITY): Payer: Self-pay

## 2024-10-04 ENCOUNTER — Other Ambulatory Visit: Payer: Self-pay

## 2024-10-05 ENCOUNTER — Ambulatory Visit
Admission: RE | Admit: 2024-10-05 | Discharge: 2024-10-05 | Disposition: A | Source: Ambulatory Visit | Attending: Physician Assistant | Admitting: Physician Assistant

## 2024-10-05 ENCOUNTER — Ambulatory Visit: Payer: Self-pay | Admitting: Physician Assistant

## 2024-10-05 DIAGNOSIS — S83411A Sprain of medial collateral ligament of right knee, initial encounter: Secondary | ICD-10-CM | POA: Insufficient documentation

## 2024-10-05 DIAGNOSIS — M25561 Pain in right knee: Secondary | ICD-10-CM | POA: Diagnosis not present

## 2024-10-05 NOTE — Progress Notes (Signed)
 Victoria Barnes,   Partial tear of right medial collateral ligament. You need to be put in hinged brace and start formal physical therapy. If not improving at all would need to see ortho? Where do you want to go for PT. Can take 4 weeks or longer to heal. [facility] get [facility] to wear.

## 2024-10-05 NOTE — Progress Notes (Signed)
 I would get a brace soon as possible. She can also order online as well or if we can get her into [facility] they will have them.   Yes because we have not addressed it in a long while and would need documentation of how active etc it is what she is doing to treat it.

## 2024-10-10 ENCOUNTER — Ambulatory Visit: Admitting: Dermatology

## 2024-10-12 ENCOUNTER — Ambulatory Visit

## 2024-10-17 ENCOUNTER — Ambulatory Visit: Admitting: Physician Assistant

## 2024-10-17 DIAGNOSIS — K2930 Chronic superficial gastritis without bleeding: Secondary | ICD-10-CM | POA: Diagnosis not present

## 2024-10-17 DIAGNOSIS — K229 Disease of esophagus, unspecified: Secondary | ICD-10-CM | POA: Diagnosis not present

## 2024-10-17 DIAGNOSIS — R1110 Vomiting, unspecified: Secondary | ICD-10-CM | POA: Diagnosis not present

## 2024-10-17 DIAGNOSIS — B3781 Candidal esophagitis: Secondary | ICD-10-CM | POA: Diagnosis not present

## 2024-10-17 DIAGNOSIS — K2289 Other specified disease of esophagus: Secondary | ICD-10-CM | POA: Diagnosis not present

## 2024-10-17 DIAGNOSIS — K3189 Other diseases of stomach and duodenum: Secondary | ICD-10-CM | POA: Diagnosis not present

## 2024-10-18 ENCOUNTER — Other Ambulatory Visit: Payer: Self-pay

## 2024-10-18 ENCOUNTER — Other Ambulatory Visit (HOSPITAL_COMMUNITY): Payer: Self-pay

## 2024-10-18 ENCOUNTER — Ambulatory Visit: Admitting: Physician Assistant

## 2024-10-18 VITALS — BP 129/87 | HR 90 | Ht 59.0 in | Wt 119.0 lb

## 2024-10-18 DIAGNOSIS — Z23 Encounter for immunization: Secondary | ICD-10-CM | POA: Diagnosis not present

## 2024-10-18 DIAGNOSIS — F900 Attention-deficit hyperactivity disorder, predominantly inattentive type: Secondary | ICD-10-CM | POA: Diagnosis not present

## 2024-10-18 DIAGNOSIS — Z113 Encounter for screening for infections with a predominantly sexual mode of transmission: Secondary | ICD-10-CM | POA: Diagnosis not present

## 2024-10-18 DIAGNOSIS — Z131 Encounter for screening for diabetes mellitus: Secondary | ICD-10-CM | POA: Diagnosis not present

## 2024-10-18 DIAGNOSIS — S83411D Sprain of medial collateral ligament of right knee, subsequent encounter: Secondary | ICD-10-CM | POA: Diagnosis not present

## 2024-10-18 DIAGNOSIS — Z13220 Encounter for screening for lipoid disorders: Secondary | ICD-10-CM | POA: Diagnosis not present

## 2024-10-18 DIAGNOSIS — F3181 Bipolar II disorder: Secondary | ICD-10-CM | POA: Diagnosis not present

## 2024-10-18 DIAGNOSIS — Z0000 Encounter for general adult medical examination without abnormal findings: Secondary | ICD-10-CM | POA: Diagnosis not present

## 2024-10-18 MED ORDER — TRAZODONE HCL 50 MG PO TABS
25.0000 mg | ORAL_TABLET | Freq: Every evening | ORAL | 1 refills | Status: AC | PRN
  Filled 2024-10-18: qty 90, 90d supply, fill #0

## 2024-10-18 MED ORDER — CARIPRAZINE HCL 3 MG PO CAPS
3.0000 mg | ORAL_CAPSULE | Freq: Every day | ORAL | 1 refills | Status: AC
  Filled 2024-10-18: qty 90, 90d supply, fill #0

## 2024-10-18 NOTE — Progress Notes (Unsigned)
 Complete physical exam  Patient: Victoria Barnes   DOB: [birth year]   38 y.o. Female  MRN: [id number]  Subjective:    Chief Complaint  Patient presents with   Annual Exam   ..Discussed the use of AI scribe software for clinical note transcription with the patient, who gave verbal consent to proceed.  History of Present Illness Victoria Barnes is a 38 year old female with a right knee injury and bipolar disorder who presents for a follow-up visit.  Knee pain and injury - Persistent pain localized to the medial aspect of the knee, corresponding to a partial tear. - No orthopedic appointment scheduled to date. - Uses a hinge brace for support, obtained by her mother, which provides adequate stabilization. - Surgery has not been immediately pursued. - Contacted by physical therapy service but has not yet initiated therapy. - Occupational duties require frequent standing; has stopped lifting people to avoid further strain. - Continues to work three days per week, with six days off between shifts.  Mood disturbance and bipolar disorder - Diagnosed with bipolar disorder in 2018, with ongoing symptoms of anxiety and depression. - Not currently attending counseling due to lack of benefit from previous therapist. - Occasionally misses work due to mood symptoms and expresses concern about job protection. - Difficulty with communication and focus during exacerbations. - Current medications include Vraylar , Wellbutrin , and Adderall, which are not taken daily. - Decline in mental health over the past six months, particularly with seasonal changes.  Sleep disturbance - Wakes two to three times nightly, with total sleep duration of only two to three hours. - Magnesium supplementation for sleep has been ineffective. - Concerned about the impact of sleep disturbance on mental health.  Preventive health and screening - Due for physical examination. - Requests STD screening due to recent sexual  activity. - Not currently fasting but plans to complete laboratory work at a convenient location.     Most recent fall risk assessment:    07/11/2022   10:26 AM  Fall Risk   Falls in the past year? 0  Number falls in past yr: 0  Injury with Fall? 0   Risk for fall due to : No Fall Risks  Follow up Falls evaluation completed      Data saved with a previous flowsheet row definition     Most recent depression screenings:    06/05/2023    1:31 PM 01/16/2023   11:08 AM  PHQ 2/9 Scores  PHQ - 2 Score 1 1  PHQ- 9 Score  4      Data saved with a previous flowsheet row definition    Vision:Within last year and Dental: No current dental problems and Receives regular dental care  Patient Active Problem List   Diagnosis Date Noted   Tear of MCL (medial collateral ligament) of knee, right, initial encounter 10/05/2024   Acute pain of right knee 09/02/2024   Bilious vomiting without nausea 09/02/2024   Right-sided chest wall pain 07/15/2023   Rib pain on right side 07/15/2023   Numbness and tingling in both hands 06/08/2023   Scarring 03/11/2023   Hyperpigmentation 03/11/2023   Acne vulgaris 03/11/2023   Restlessness 12/09/2022   Skin rash 12/09/2022   Moderate episode of recurrent major depressive disorder (HCC) 11/12/2022   Vaginal irritation 04/07/2022   Tachycardia 05/02/2019   Alopecia 05/02/2019   Acute stress reaction 02/07/2019   Anxiety 09/01/2018   Seasonal allergies 03/14/2018   Dehydration 12/16/2017  Bipolar 2 disorder (HCC) 07/07/2017   Fatigue 02/23/2017   Paresthesias 09/10/2016   Situational mixed anxiety and depressive disorder 06/17/2016   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   Migraine without status migrainosus, not intractable 02/26/2016   Attention deficit hyperactivity disorder (ADHD) 01/08/2016   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/2015   Eczema 07/17/2015   Past Medical History:  Diagnosis Date   ADHD    Bipolar I disorder (HCC)     Depression    Eczema 2012   Migraine without status migrainosus, not intractable 02/26/2016   Past Surgical History:  Procedure Laterality Date   BREAST ENHANCEMENT SURGERY Bilateral June 2015   WISDOM TOOTH EXTRACTION     Family History  Problem Relation Age of Onset   Hyperlipidemia Mother    Personality disorder Maternal Grandmother    Depression Maternal Grandmother    Mood Disorder Maternal Grandmother    Allergies[1]    Patient Care Team: Antoniette Vermell LITTIE DEVONNA as PCP - General (Family Medicine)   Show/hide medication list[2]  ROS        Objective:     BP 129/87   Pulse 90   Ht 4' 11 (1.499 m)   Wt 119 lb (54 kg)   SpO2 99%   BMI 24.04 kg/m  BP Readings from Last 3 Encounters:  10/18/24 129/87  09/02/24 133/76  04/28/24 107/64   Wt Readings from Last 3 Encounters:  10/18/24 119 lb (54 kg)  09/02/24 119 lb (54 kg)  04/20/24 115 lb (52.2 kg)      Physical Exam      Assessment & Plan:    Routine Health Maintenance and Physical Exam  Immunization History  Administered Date(s) Administered   Influenza,inj,Quad PF,6+ Mos 09/07/2014, 08/16/2018   Influenza-Unspecified 08/04/2015, 07/29/2017   Tdap 09/03/2014    Health Maintenance  Topic Date Due   DTaP/Tdap/Td (2 - Td or Tdap) 09/03/2024   COVID-19 Vaccine (1) 11/03/2024 (Originally 10/16/1986)   Influenza Vaccine  01/31/2025 (Originally 06/03/2024)   Hepatitis B Vaccines 19-59 Average Risk (1 of 3 - 19+ 3-dose series) 09/02/2025 (Originally 04/17/2007)   HPV VACCINES (1 - 3-dose SCDM series) 09/02/2025 (Originally 04/17/2015)   Cervical Cancer Screening (HPV/Pap Cotest)  04/08/2027   Hepatitis C Screening  Completed   HIV Screening  Completed   Pneumococcal Vaccine  Aged Out   Meningococcal B Vaccine  Aged Out    Discussed health benefits of physical activity, and encouraged her to engage in regular exercise appropriate for her age and condition.      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    Viibryd  [Vilazodone  Hcl]     Abnormal Frightening Dreams  [2] Outpatient Medications Prior to Visit  Medication Sig   [START ON 11/01/2024] amphetamine -dextroamphetamine  (ADDERALL) 30 MG tablet Take 1 tablet by mouth 2 (two) times daily.   amphetamine -dextroamphetamine  (ADDERALL) 30 MG tablet Take 1 tablet by mouth 2 (two) times daily.   amphetamine -dextroamphetamine  (ADDERALL) 30 MG tablet Take 1 tablet by mouth 2 (two) times daily.   [START ON 11/01/2024] amphetamine -dextroamphetamine  (ADDERALL) 30 MG tablet Take 1 tablet by mouth 2 (two) times daily.   buPROPion  (WELLBUTRIN  XL) 150 MG 24 hr tablet Take 3  tablets (450 mg total) by mouth in the morning.   cariprazine  (VRAYLAR ) 3 MG capsule Take 1 capsule (3 mg total) by mouth daily.   cholecalciferol (VITAMIN D3) 25 MCG (1000 UNIT) tablet Take 1,000 Units by mouth daily.   clonazePAM  (KLONOPIN ) 1 MG tablet Take 1 tablet (1 mg total) by mouth daily as needed for acute anxiety and panic.   diclofenac  Sodium (VOLTAREN ) 1 % GEL Apply 4 g topically 4 (four) times daily. To affected joint.   famotidine  (PEPCID ) 40 MG tablet Take 1 tablet (40 mg total) by mouth at bedtime.   fluticasone  (FLONASE ) 50 MCG/ACT nasal spray Place 1 spray into both nostrils daily.   propranolol  (INDERAL ) 10 MG tablet Take 1-3 tablets (10-30 mg total) by mouth up to  3 (three) times daily for restlessness.   rizatriptan  (MAXALT -MLT) 10 MG disintegrating tablet TAKE 1 TABLET BY MOUTH AS NEEDED FOR MIGRAINE. MAY REPEAT IN 2 HOURS IF NEEDED   Safety Seal Miscellaneous MISC Apply 1 application  topically in the morning. Medication Name:  Melaxemic Cream   spironolactone  (ALDACTONE ) 100 MG tablet Take 1 tablet (100 mg total) by mouth daily.   tretinoin  (RETIN-A ) 0.1 % cream Apply topically at bedtime.   valACYclovir  (VALTREX ) 1000 MG tablet TAKE 2 TABLETS TWICE A DAY FOR ONE DAY FOR OUTBREAK.   No facility-administered medications prior to visit.

## 2024-10-18 NOTE — Patient Instructions (Signed)

## 2024-10-19 ENCOUNTER — Other Ambulatory Visit

## 2024-10-19 DIAGNOSIS — Z113 Encounter for screening for infections with a predominantly sexual mode of transmission: Secondary | ICD-10-CM | POA: Diagnosis not present

## 2024-10-21 ENCOUNTER — Encounter: Admitting: Physician Assistant

## 2024-10-21 ENCOUNTER — Encounter: Payer: Self-pay | Admitting: Physician Assistant

## 2024-10-22 LAB — GC/CHLAMYDIA PROBE AMP
Chlamydia trachomatis, NAA: NEGATIVE
Neisseria Gonorrhoeae by PCR: NEGATIVE

## 2024-10-24 ENCOUNTER — Ambulatory Visit: Payer: Self-pay | Admitting: Physician Assistant

## 2024-10-24 NOTE — Progress Notes (Signed)
Negative STD screening

## 2024-11-01 ENCOUNTER — Other Ambulatory Visit (HOSPITAL_COMMUNITY): Payer: Self-pay

## 2024-11-01 DIAGNOSIS — Z0000 Encounter for general adult medical examination without abnormal findings: Secondary | ICD-10-CM | POA: Diagnosis not present

## 2024-11-01 DIAGNOSIS — Z13220 Encounter for screening for lipoid disorders: Secondary | ICD-10-CM | POA: Diagnosis not present

## 2024-11-01 DIAGNOSIS — Z113 Encounter for screening for infections with a predominantly sexual mode of transmission: Secondary | ICD-10-CM | POA: Diagnosis not present

## 2024-11-01 DIAGNOSIS — Z131 Encounter for screening for diabetes mellitus: Secondary | ICD-10-CM | POA: Diagnosis not present

## 2024-11-02 LAB — CMP14+EGFR
ALT: 12 IU/L (ref 0–32)
AST: 17 IU/L (ref 0–40)
Albumin: 3.9 g/dL (ref 3.9–4.9)
Alkaline Phosphatase: 53 IU/L (ref 41–116)
BUN/Creatinine Ratio: 5 — ABNORMAL LOW (ref 9–23)
BUN: 4 mg/dL — ABNORMAL LOW (ref 6–20)
Bilirubin Total: 0.4 mg/dL (ref 0.0–1.2)
CO2: 19 mmol/L — ABNORMAL LOW (ref 20–29)
Calcium: 9.4 mg/dL (ref 8.7–10.2)
Chloride: 101 mmol/L (ref 96–106)
Creatinine, Ser: 0.74 mg/dL (ref 0.57–1.00)
Globulin, Total: 2.6 g/dL (ref 1.5–4.5)
Glucose: 77 mg/dL (ref 70–99)
Potassium: 3.6 mmol/L (ref 3.5–5.2)
Sodium: 138 mmol/L (ref 134–144)
Total Protein: 6.5 g/dL (ref 6.0–8.5)
eGFR: 107 mL/min/1.73

## 2024-11-02 LAB — CBC WITH DIFFERENTIAL/PLATELET
Basophils Absolute: 0 x10E3/uL (ref 0.0–0.2)
Basos: 1 %
EOS (ABSOLUTE): 0.3 x10E3/uL (ref 0.0–0.4)
Eos: 4 %
Hematocrit: 37.6 % (ref 34.0–46.6)
Hemoglobin: 12.4 g/dL (ref 11.1–15.9)
Immature Grans (Abs): 0 x10E3/uL (ref 0.0–0.1)
Immature Granulocytes: 0 %
Lymphocytes Absolute: 1.8 x10E3/uL (ref 0.7–3.1)
Lymphs: 23 %
MCH: 30.1 pg (ref 26.6–33.0)
MCHC: 33 g/dL (ref 31.5–35.7)
MCV: 91 fL (ref 79–97)
Monocytes Absolute: 0.4 x10E3/uL (ref 0.1–0.9)
Monocytes: 6 %
Neutrophils Absolute: 5.1 x10E3/uL (ref 1.4–7.0)
Neutrophils: 66 %
Platelets: 260 x10E3/uL (ref 150–450)
RBC: 4.12 x10E6/uL (ref 3.77–5.28)
RDW: 13 % (ref 11.7–15.4)
WBC: 7.6 x10E3/uL (ref 3.4–10.8)

## 2024-11-02 LAB — VITAMIN B12: Vitamin B-12: 973 pg/mL (ref 232–1245)

## 2024-11-02 LAB — SYPHILIS: RPR W/REFLEX TO RPR TITER AND TREPONEMAL ANTIBODIES, TRADITIONAL SCREENING AND DIAGNOSIS ALGORITHM: RPR Ser Ql: NONREACTIVE

## 2024-11-02 LAB — VITAMIN D 25 HYDROXY (VIT D DEFICIENCY, FRACTURES): Vit D, 25-Hydroxy: 33.4 ng/mL (ref 30.0–100.0)

## 2024-11-02 LAB — LIPID PANEL
Chol/HDL Ratio: 2.5 ratio (ref 0.0–4.4)
Cholesterol, Total: 174 mg/dL (ref 100–199)
HDL: 71 mg/dL
LDL Chol Calc (NIH): 92 mg/dL (ref 0–99)
Triglycerides: 56 mg/dL (ref 0–149)
VLDL Cholesterol Cal: 11 mg/dL (ref 5–40)

## 2024-11-02 LAB — TSH: TSH: 1.42 u[IU]/mL (ref 0.450–4.500)

## 2024-11-02 LAB — HIV ANTIBODY (ROUTINE TESTING W REFLEX): HIV Screen 4th Generation wRfx: NONREACTIVE

## 2024-11-09 ENCOUNTER — Other Ambulatory Visit: Payer: Self-pay | Admitting: Gastroenterology

## 2024-11-09 DIAGNOSIS — R1110 Vomiting, unspecified: Secondary | ICD-10-CM

## 2024-11-16 ENCOUNTER — Ambulatory Visit
Admission: RE | Admit: 2024-11-16 | Discharge: 2024-11-16 | Disposition: A | Discharge status: Home / Self Care | Source: Ambulatory Visit | Attending: Gastroenterology | Admitting: Gastroenterology

## 2024-11-16 DIAGNOSIS — R1110 Vomiting, unspecified: Secondary | ICD-10-CM

## 2024-11-18 ENCOUNTER — Other Ambulatory Visit (HOSPITAL_COMMUNITY): Payer: Self-pay | Admitting: Gastroenterology

## 2024-11-18 DIAGNOSIS — R935 Abnormal findings on diagnostic imaging of other abdominal regions, including retroperitoneum: Secondary | ICD-10-CM

## 2024-11-18 DIAGNOSIS — R1110 Vomiting, unspecified: Secondary | ICD-10-CM

## 2024-11-18 DIAGNOSIS — R110 Nausea: Secondary | ICD-10-CM

## 2024-11-24 ENCOUNTER — Ambulatory Visit (HOSPITAL_COMMUNITY)
Admission: RE | Admit: 2024-11-24 | Discharge: 2024-11-24 | Disposition: A | Discharge status: Home / Self Care | Source: Ambulatory Visit | Attending: Gastroenterology | Admitting: Gastroenterology

## 2024-11-24 DIAGNOSIS — R110 Nausea: Secondary | ICD-10-CM | POA: Diagnosis present

## 2024-11-24 DIAGNOSIS — R935 Abnormal findings on diagnostic imaging of other abdominal regions, including retroperitoneum: Secondary | ICD-10-CM | POA: Insufficient documentation

## 2024-11-24 DIAGNOSIS — R1110 Vomiting, unspecified: Secondary | ICD-10-CM | POA: Diagnosis present

## 2024-11-24 MED ORDER — TECHNETIUM TC 99M MEBROFENIN IV KIT
5.0000 | PACK | Freq: Once | INTRAVENOUS | Status: AC | PRN
  Administered 2024-11-24: 5.39 via INTRAVENOUS
